# Patient Record
Sex: Female | Born: 1955 | Race: Black or African American | Hispanic: No | Smoking: Never smoker
Health system: Southern US, Community
[De-identification: ages and names within clinical notes are randomized; demographics above are authoritative.]

## PROBLEM LIST (undated history)

## (undated) DIAGNOSIS — E559 Vitamin D deficiency, unspecified: Secondary | ICD-10-CM

## (undated) DIAGNOSIS — R7303 Prediabetes: Secondary | ICD-10-CM

## (undated) DIAGNOSIS — F329 Major depressive disorder, single episode, unspecified: Secondary | ICD-10-CM

## (undated) DIAGNOSIS — F419 Anxiety disorder, unspecified: Secondary | ICD-10-CM

## (undated) DIAGNOSIS — R011 Cardiac murmur, unspecified: Secondary | ICD-10-CM

## (undated) DIAGNOSIS — E538 Deficiency of other specified B group vitamins: Secondary | ICD-10-CM

## (undated) DIAGNOSIS — D509 Iron deficiency anemia, unspecified: Secondary | ICD-10-CM

## (undated) DIAGNOSIS — K219 Gastro-esophageal reflux disease without esophagitis: Secondary | ICD-10-CM

## (undated) DIAGNOSIS — M1990 Unspecified osteoarthritis, unspecified site: Secondary | ICD-10-CM

## (undated) DIAGNOSIS — IMO0001 Reserved for inherently not codable concepts without codable children: Secondary | ICD-10-CM

## (undated) DIAGNOSIS — F988 Other specified behavioral and emotional disorders with onset usually occurring in childhood and adolescence: Secondary | ICD-10-CM

## (undated) DIAGNOSIS — E119 Type 2 diabetes mellitus without complications: Secondary | ICD-10-CM

## (undated) DIAGNOSIS — E7800 Pure hypercholesterolemia, unspecified: Secondary | ICD-10-CM

## (undated) DIAGNOSIS — I10 Essential (primary) hypertension: Secondary | ICD-10-CM

## (undated) DIAGNOSIS — R600 Localized edema: Secondary | ICD-10-CM

## (undated) DIAGNOSIS — R739 Hyperglycemia, unspecified: Secondary | ICD-10-CM

## (undated) DIAGNOSIS — J189 Pneumonia, unspecified organism: Secondary | ICD-10-CM

## (undated) DIAGNOSIS — F32A Depression, unspecified: Secondary | ICD-10-CM

## (undated) HISTORY — DX: Pure hypercholesterolemia, unspecified: E78.00

## (undated) HISTORY — DX: Iron deficiency anemia, unspecified: D50.9

## (undated) HISTORY — DX: Vitamin D deficiency, unspecified: E55.9

## (undated) HISTORY — DX: Type 2 diabetes mellitus without complications: E11.9

## (undated) HISTORY — DX: Unspecified osteoarthritis, unspecified site: M19.90

## (undated) HISTORY — DX: Hyperglycemia, unspecified: R73.9

## (undated) HISTORY — PX: NO PAST SURGERIES: SHX2092

## (undated) HISTORY — DX: Gastro-esophageal reflux disease without esophagitis: K21.9

## (undated) HISTORY — DX: Deficiency of other specified B group vitamins: E53.8

## (undated) HISTORY — DX: Major depressive disorder, single episode, unspecified: F32.9

## (undated) HISTORY — DX: Reserved for inherently not codable concepts without codable children: IMO0001

## (undated) HISTORY — DX: Anxiety disorder, unspecified: F41.9

## (undated) HISTORY — DX: Localized edema: R60.0

## (undated) HISTORY — DX: Other specified behavioral and emotional disorders with onset usually occurring in childhood and adolescence: F98.8

## (undated) HISTORY — DX: Cardiac murmur, unspecified: R01.1

## (undated) HISTORY — DX: Depression, unspecified: F32.A

---

## 1999-12-22 ENCOUNTER — Emergency Department (HOSPITAL_COMMUNITY): Admission: EM | Admit: 1999-12-22 | Discharge: 1999-12-22 | Payer: Self-pay | Admitting: Emergency Medicine

## 2000-01-22 ENCOUNTER — Encounter: Payer: Self-pay | Admitting: Emergency Medicine

## 2000-01-22 ENCOUNTER — Emergency Department (HOSPITAL_COMMUNITY): Admission: EM | Admit: 2000-01-22 | Discharge: 2000-01-22 | Payer: Self-pay | Admitting: Emergency Medicine

## 2001-06-29 ENCOUNTER — Emergency Department (HOSPITAL_COMMUNITY): Admission: EM | Admit: 2001-06-29 | Discharge: 2001-06-29 | Payer: Self-pay | Admitting: *Deleted

## 2002-06-07 ENCOUNTER — Encounter: Payer: Self-pay | Admitting: Emergency Medicine

## 2002-06-07 ENCOUNTER — Emergency Department (HOSPITAL_COMMUNITY): Admission: EM | Admit: 2002-06-07 | Discharge: 2002-06-07 | Payer: Self-pay | Admitting: Emergency Medicine

## 2002-06-09 ENCOUNTER — Encounter: Admission: RE | Admit: 2002-06-09 | Discharge: 2002-06-09 | Payer: Self-pay | Admitting: Family Medicine

## 2002-06-09 ENCOUNTER — Encounter: Payer: Self-pay | Admitting: Family Medicine

## 2002-11-16 ENCOUNTER — Encounter: Admission: RE | Admit: 2002-11-16 | Discharge: 2002-11-16 | Payer: Self-pay | Admitting: Family Medicine

## 2002-11-16 ENCOUNTER — Encounter: Payer: Self-pay | Admitting: Family Medicine

## 2003-01-03 ENCOUNTER — Encounter: Payer: Self-pay | Admitting: Obstetrics

## 2003-01-03 ENCOUNTER — Encounter: Admission: RE | Admit: 2003-01-03 | Discharge: 2003-01-03 | Payer: Self-pay | Admitting: Obstetrics

## 2003-07-04 ENCOUNTER — Encounter: Payer: Self-pay | Admitting: Family Medicine

## 2003-07-04 ENCOUNTER — Encounter: Admission: RE | Admit: 2003-07-04 | Discharge: 2003-07-04 | Payer: Self-pay | Admitting: Family Medicine

## 2003-11-24 ENCOUNTER — Encounter: Admission: RE | Admit: 2003-11-24 | Discharge: 2003-11-24 | Payer: Self-pay | Admitting: Family Medicine

## 2004-08-16 ENCOUNTER — Ambulatory Visit: Payer: Self-pay | Admitting: Family Medicine

## 2004-08-30 ENCOUNTER — Ambulatory Visit: Payer: Self-pay | Admitting: Sports Medicine

## 2004-10-17 ENCOUNTER — Ambulatory Visit: Payer: Self-pay | Admitting: Family Medicine

## 2004-12-10 ENCOUNTER — Ambulatory Visit: Payer: Self-pay | Admitting: Family Medicine

## 2005-01-08 ENCOUNTER — Ambulatory Visit: Payer: Self-pay | Admitting: Family Medicine

## 2005-01-16 ENCOUNTER — Ambulatory Visit (HOSPITAL_COMMUNITY): Admission: RE | Admit: 2005-01-16 | Discharge: 2005-01-16 | Payer: Self-pay | Admitting: Family Medicine

## 2006-01-02 ENCOUNTER — Ambulatory Visit: Payer: Self-pay | Admitting: Internal Medicine

## 2006-04-22 ENCOUNTER — Ambulatory Visit (HOSPITAL_COMMUNITY): Admission: RE | Admit: 2006-04-22 | Discharge: 2006-04-22 | Payer: Self-pay | Admitting: Unknown Physician Specialty

## 2006-08-07 DIAGNOSIS — E1159 Type 2 diabetes mellitus with other circulatory complications: Secondary | ICD-10-CM | POA: Insufficient documentation

## 2006-08-07 DIAGNOSIS — I10 Essential (primary) hypertension: Secondary | ICD-10-CM

## 2006-08-07 DIAGNOSIS — D509 Iron deficiency anemia, unspecified: Secondary | ICD-10-CM | POA: Insufficient documentation

## 2007-06-17 ENCOUNTER — Encounter (INDEPENDENT_AMBULATORY_CARE_PROVIDER_SITE_OTHER): Payer: Self-pay | Admitting: *Deleted

## 2007-06-17 ENCOUNTER — Ambulatory Visit: Payer: Self-pay | Admitting: Family Medicine

## 2007-06-17 LAB — CONVERTED CEMR LAB
Basophils Absolute: 0 10*3/uL (ref 0.0–0.1)
CO2: 23 meq/L (ref 19–32)
Chlamydia, DNA Probe: NEGATIVE
Chloride: 105 meq/L (ref 96–112)
Creatinine, Ser: 0.88 mg/dL (ref 0.40–1.20)
Eosinophils Absolute: 0.1 10*3/uL (ref 0.0–0.7)
GC Probe Amp, Genital: NEGATIVE
HCT: 34.6 % — ABNORMAL LOW (ref 36.0–46.0)
Hemoglobin: 11.4 g/dL — ABNORMAL LOW (ref 12.0–15.0)
Lymphocytes Relative: 39 % (ref 12–46)
MCV: 84.4 fL (ref 78.0–100.0)
Neutro Abs: 2.7 10*3/uL (ref 1.7–7.7)
Platelets: 310 10*3/uL (ref 150–400)
Potassium: 4 meq/L (ref 3.5–5.3)
RBC: 4.1 M/uL (ref 3.87–5.11)
RDW: 16.3 % — ABNORMAL HIGH (ref 11.5–15.5)
Total Bilirubin: 0.7 mg/dL (ref 0.3–1.2)
WBC: 5.2 10*3/uL (ref 4.0–10.5)

## 2007-06-18 ENCOUNTER — Encounter (INDEPENDENT_AMBULATORY_CARE_PROVIDER_SITE_OTHER): Payer: Self-pay | Admitting: *Deleted

## 2007-06-22 ENCOUNTER — Encounter (INDEPENDENT_AMBULATORY_CARE_PROVIDER_SITE_OTHER): Payer: Self-pay | Admitting: *Deleted

## 2007-06-22 LAB — CONVERTED CEMR LAB: Pap Smear: NORMAL

## 2007-07-15 ENCOUNTER — Emergency Department (HOSPITAL_COMMUNITY): Admission: EM | Admit: 2007-07-15 | Discharge: 2007-07-16 | Payer: Self-pay | Admitting: Emergency Medicine

## 2007-07-20 ENCOUNTER — Ambulatory Visit: Payer: Self-pay | Admitting: Sports Medicine

## 2007-07-20 ENCOUNTER — Encounter (INDEPENDENT_AMBULATORY_CARE_PROVIDER_SITE_OTHER): Payer: Self-pay | Admitting: *Deleted

## 2007-07-20 DIAGNOSIS — K219 Gastro-esophageal reflux disease without esophagitis: Secondary | ICD-10-CM

## 2007-07-20 LAB — CONVERTED CEMR LAB
Basophils Absolute: 0 10*3/uL (ref 0.0–0.1)
Basophils Relative: 0 % (ref 0–1)
Eosinophils Absolute: 0.1 10*3/uL (ref 0.0–0.7)
Eosinophils Relative: 2 % (ref 0–5)
Hemoglobin: 12.3 g/dL (ref 12.0–15.0)
Lymphs Abs: 2.5 10*3/uL (ref 0.7–4.0)
MCHC: 34 g/dL (ref 30.0–36.0)
Monocytes Absolute: 0.3 10*3/uL (ref 0.1–1.0)
RDW: 14.9 % (ref 11.5–15.5)
Retic Ct Pct: 1.4 % (ref 0.4–3.1)
TIBC: 286 ug/dL (ref 250–470)
UIBC: 231 ug/dL

## 2007-07-21 ENCOUNTER — Encounter (INDEPENDENT_AMBULATORY_CARE_PROVIDER_SITE_OTHER): Payer: Self-pay | Admitting: *Deleted

## 2007-07-21 ENCOUNTER — Ambulatory Visit: Payer: Self-pay | Admitting: Family Medicine

## 2007-07-24 ENCOUNTER — Encounter (INDEPENDENT_AMBULATORY_CARE_PROVIDER_SITE_OTHER): Payer: Self-pay | Admitting: *Deleted

## 2007-07-28 ENCOUNTER — Ambulatory Visit (HOSPITAL_COMMUNITY): Admission: RE | Admit: 2007-07-28 | Discharge: 2007-07-28 | Payer: Self-pay | Admitting: *Deleted

## 2007-08-18 ENCOUNTER — Ambulatory Visit: Payer: Self-pay | Admitting: Family Medicine

## 2007-08-18 ENCOUNTER — Encounter (INDEPENDENT_AMBULATORY_CARE_PROVIDER_SITE_OTHER): Payer: Self-pay | Admitting: *Deleted

## 2007-08-18 DIAGNOSIS — R011 Cardiac murmur, unspecified: Secondary | ICD-10-CM

## 2007-08-18 LAB — CONVERTED CEMR LAB
BUN: 16 mg/dL (ref 6–23)
Calcium: 9.6 mg/dL (ref 8.4–10.5)
Chloride: 104 meq/L (ref 96–112)
Creatinine, Ser: 0.92 mg/dL (ref 0.40–1.20)
Glucose, Bld: 88 mg/dL (ref 70–99)
Potassium: 3.4 meq/L — ABNORMAL LOW (ref 3.5–5.3)

## 2007-08-26 ENCOUNTER — Ambulatory Visit: Payer: Self-pay | Admitting: Cardiology

## 2007-08-26 ENCOUNTER — Ambulatory Visit (HOSPITAL_COMMUNITY): Admission: RE | Admit: 2007-08-26 | Discharge: 2007-08-26 | Payer: Self-pay | Admitting: Family Medicine

## 2007-08-26 ENCOUNTER — Encounter: Payer: Self-pay | Admitting: Family Medicine

## 2007-09-09 ENCOUNTER — Telehealth (INDEPENDENT_AMBULATORY_CARE_PROVIDER_SITE_OTHER): Payer: Self-pay | Admitting: *Deleted

## 2007-09-30 ENCOUNTER — Ambulatory Visit: Payer: Self-pay | Admitting: Family Medicine

## 2007-09-30 ENCOUNTER — Encounter (INDEPENDENT_AMBULATORY_CARE_PROVIDER_SITE_OTHER): Payer: Self-pay | Admitting: *Deleted

## 2007-09-30 LAB — CONVERTED CEMR LAB: Potassium: 3.9 meq/L (ref 3.5–5.3)

## 2007-10-01 ENCOUNTER — Encounter (INDEPENDENT_AMBULATORY_CARE_PROVIDER_SITE_OTHER): Payer: Self-pay | Admitting: *Deleted

## 2007-10-28 ENCOUNTER — Telehealth: Payer: Self-pay | Admitting: *Deleted

## 2007-12-14 ENCOUNTER — Emergency Department (HOSPITAL_COMMUNITY): Admission: EM | Admit: 2007-12-14 | Discharge: 2007-12-14 | Payer: Self-pay | Admitting: Emergency Medicine

## 2007-12-15 ENCOUNTER — Telehealth: Payer: Self-pay | Admitting: *Deleted

## 2007-12-16 ENCOUNTER — Ambulatory Visit: Payer: Self-pay | Admitting: Family Medicine

## 2007-12-27 ENCOUNTER — Emergency Department (HOSPITAL_COMMUNITY): Admission: EM | Admit: 2007-12-27 | Discharge: 2007-12-27 | Payer: Self-pay | Admitting: Emergency Medicine

## 2008-01-13 ENCOUNTER — Emergency Department (HOSPITAL_COMMUNITY): Admission: EM | Admit: 2008-01-13 | Discharge: 2008-01-13 | Payer: Self-pay | Admitting: Emergency Medicine

## 2008-04-14 ENCOUNTER — Encounter (INDEPENDENT_AMBULATORY_CARE_PROVIDER_SITE_OTHER): Payer: Self-pay | Admitting: *Deleted

## 2008-04-18 ENCOUNTER — Encounter: Payer: Self-pay | Admitting: Family Medicine

## 2008-04-18 ENCOUNTER — Ambulatory Visit: Payer: Self-pay | Admitting: Family Medicine

## 2008-04-19 LAB — CONVERTED CEMR LAB: TSH: 1.435 microintl units/mL (ref 0.350–4.50)

## 2008-05-30 ENCOUNTER — Telehealth: Payer: Self-pay | Admitting: *Deleted

## 2008-05-31 ENCOUNTER — Encounter (INDEPENDENT_AMBULATORY_CARE_PROVIDER_SITE_OTHER): Payer: Self-pay | Admitting: Family Medicine

## 2008-05-31 ENCOUNTER — Ambulatory Visit: Payer: Self-pay | Admitting: Family Medicine

## 2008-05-31 LAB — CONVERTED CEMR LAB

## 2008-06-05 ENCOUNTER — Encounter (INDEPENDENT_AMBULATORY_CARE_PROVIDER_SITE_OTHER): Payer: Self-pay | Admitting: Family Medicine

## 2008-06-23 ENCOUNTER — Telehealth: Payer: Self-pay | Admitting: Family Medicine

## 2008-06-28 ENCOUNTER — Telehealth: Payer: Self-pay | Admitting: Family Medicine

## 2008-07-25 ENCOUNTER — Encounter: Payer: Self-pay | Admitting: Family Medicine

## 2008-07-25 ENCOUNTER — Ambulatory Visit: Payer: Self-pay | Admitting: Family Medicine

## 2008-07-25 DIAGNOSIS — E663 Overweight: Secondary | ICD-10-CM | POA: Insufficient documentation

## 2008-07-25 DIAGNOSIS — F988 Other specified behavioral and emotional disorders with onset usually occurring in childhood and adolescence: Secondary | ICD-10-CM | POA: Insufficient documentation

## 2008-07-25 LAB — CONVERTED CEMR LAB
BUN: 12 mg/dL (ref 6–23)
Chlamydia, DNA Probe: NEGATIVE
Hemoglobin: 11.7 g/dL — ABNORMAL LOW (ref 12.0–15.0)
LDL Cholesterol: 63 mg/dL (ref 0–99)
MCHC: 33.9 g/dL (ref 30.0–36.0)
MCV: 83.9 fL (ref 78.0–100.0)
Pap Smear: NORMAL
Potassium: 3.7 meq/L (ref 3.5–5.3)
RDW: 16 % — ABNORMAL HIGH (ref 11.5–15.5)
Triglycerides: 44 mg/dL (ref <150)
VLDL: 9 mg/dL (ref 0–40)
WBC: 5.5 10*3/uL (ref 4.0–10.5)
Whiff Test: NEGATIVE

## 2008-07-26 ENCOUNTER — Encounter: Payer: Self-pay | Admitting: Family Medicine

## 2008-08-24 ENCOUNTER — Ambulatory Visit: Payer: Self-pay | Admitting: Family Medicine

## 2008-08-24 DIAGNOSIS — F329 Major depressive disorder, single episode, unspecified: Secondary | ICD-10-CM

## 2008-08-24 DIAGNOSIS — F3289 Other specified depressive episodes: Secondary | ICD-10-CM | POA: Insufficient documentation

## 2008-08-26 ENCOUNTER — Ambulatory Visit (HOSPITAL_COMMUNITY): Admission: RE | Admit: 2008-08-26 | Discharge: 2008-08-26 | Payer: Self-pay | Admitting: Family Medicine

## 2008-09-12 ENCOUNTER — Ambulatory Visit: Payer: Self-pay | Admitting: Family Medicine

## 2009-02-19 IMAGING — CR DG CHEST 2V
2 series · 2 of 2 positions shown · non-contrast
Comparison: None available

CLINICAL DATA: Chest pain, shortness of breath, cough

CHEST - 2 VIEW

[w chest pa]
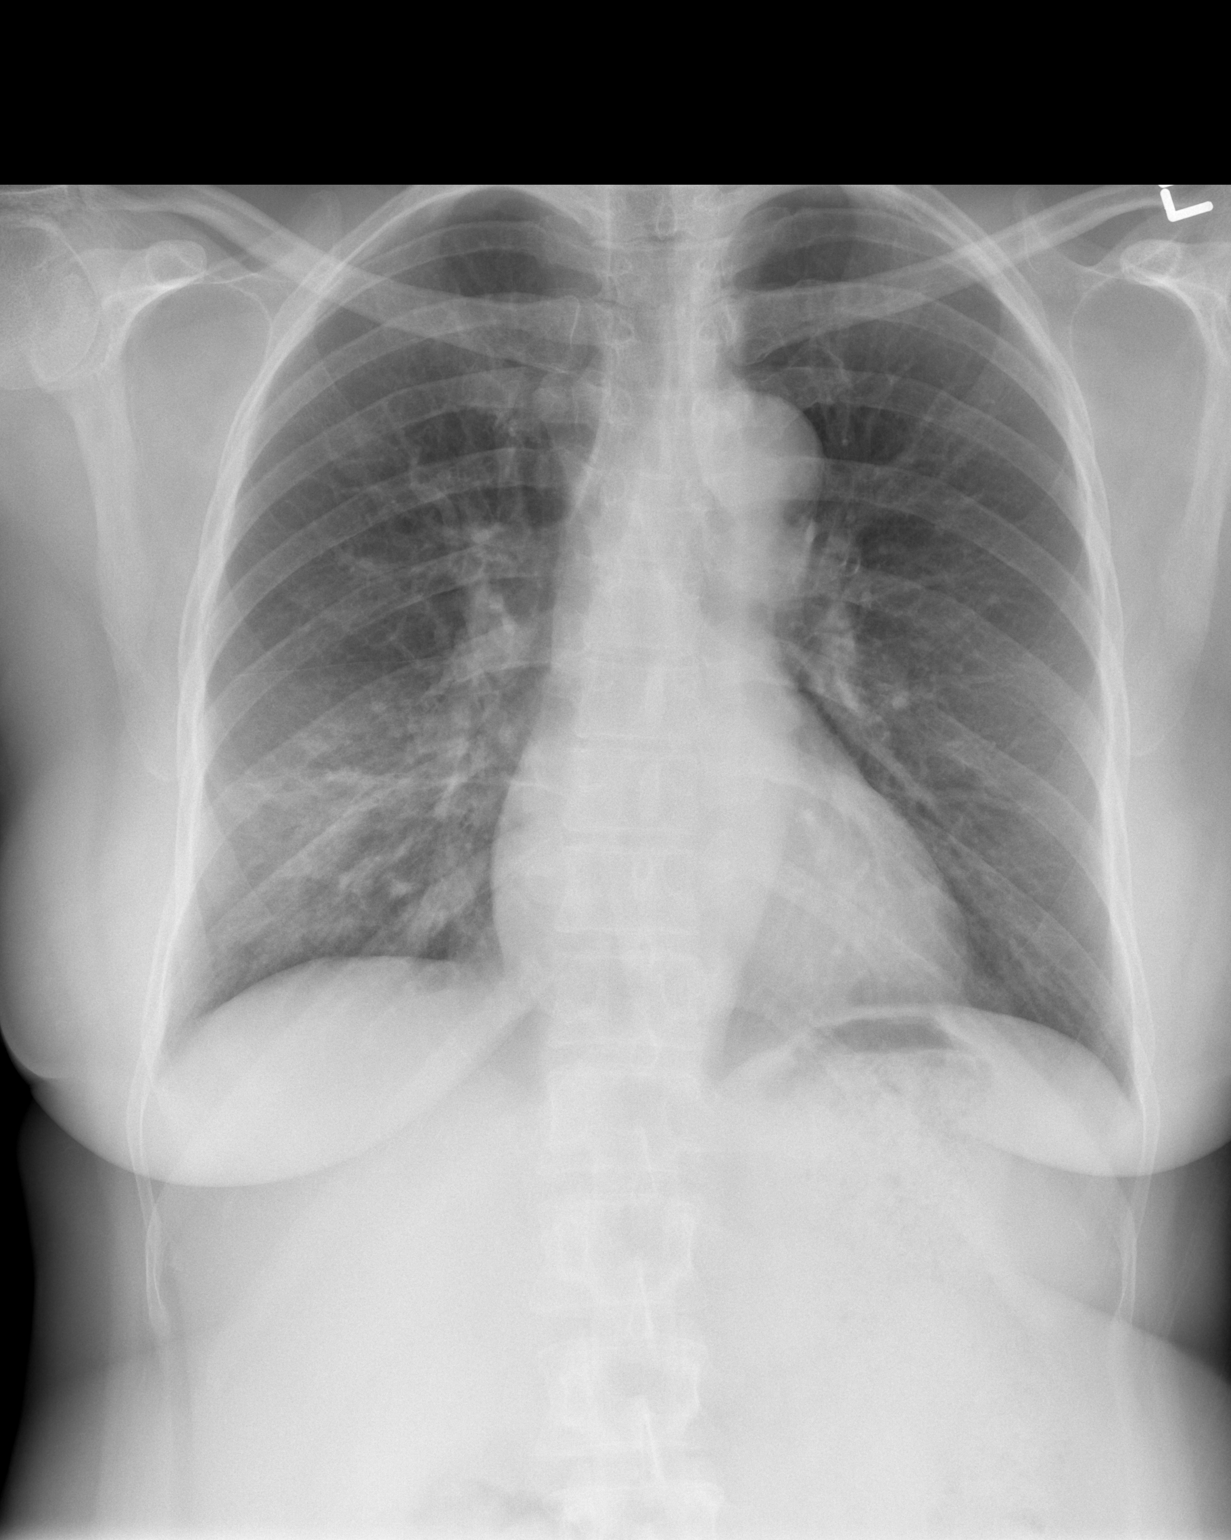

[w chest lat]
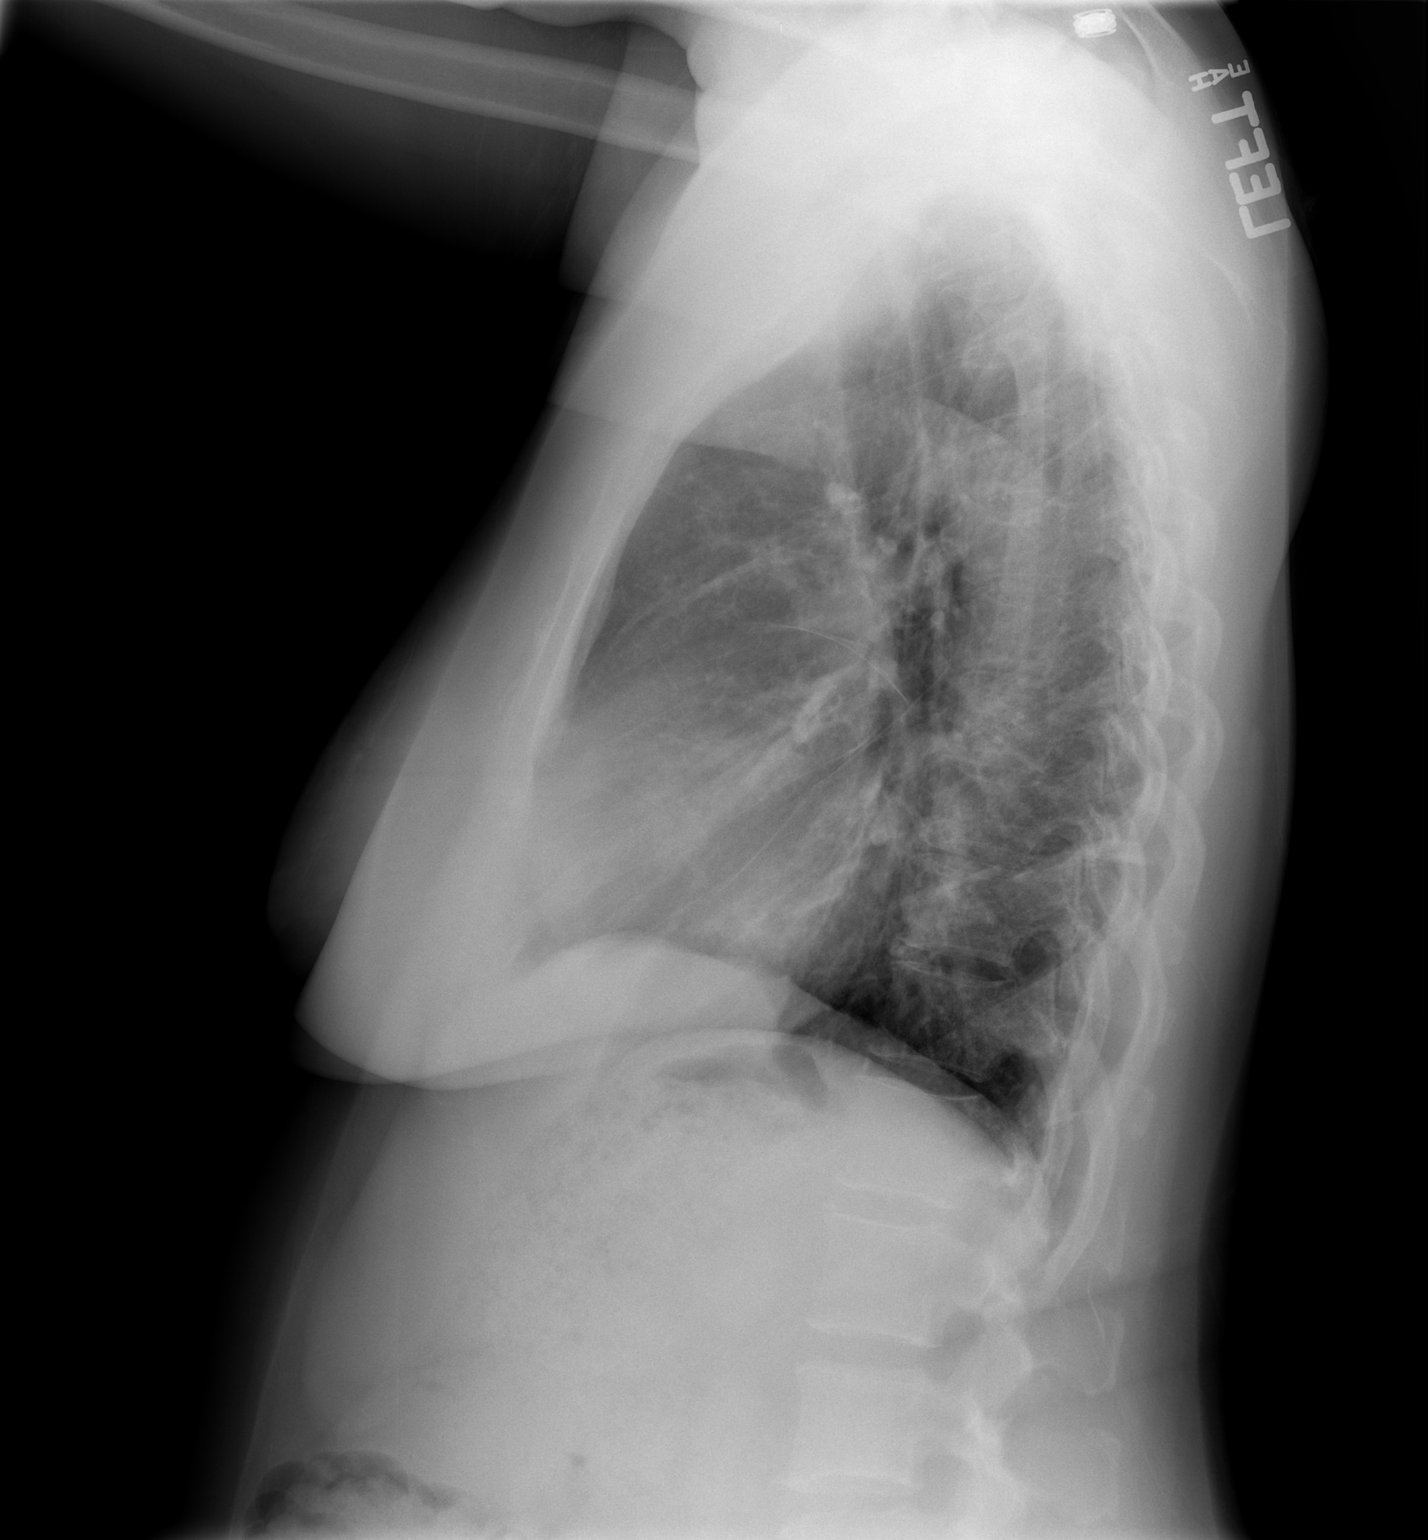

[2 of 2 positions shown; findings below may reference images not displayed]

FINDINGS: There are  patchy airspace opacities in the right lower
lobe.  Left lung clear.  Heart size normal.  No effusion.
Visualized bones unremarkable.
IMPRESSION: 1.  Patchy right lower lobe airspace opacities suggesting pneumonia

## 2009-03-13 ENCOUNTER — Encounter: Payer: Self-pay | Admitting: Family Medicine

## 2009-03-13 ENCOUNTER — Ambulatory Visit: Payer: Self-pay | Admitting: Family Medicine

## 2009-03-13 DIAGNOSIS — E559 Vitamin D deficiency, unspecified: Secondary | ICD-10-CM | POA: Insufficient documentation

## 2009-03-13 LAB — CONVERTED CEMR LAB: Vit D, 25-Hydroxy: 19 ng/mL — ABNORMAL LOW (ref 30–89)

## 2009-03-14 ENCOUNTER — Telehealth (INDEPENDENT_AMBULATORY_CARE_PROVIDER_SITE_OTHER): Payer: Self-pay | Admitting: *Deleted

## 2009-07-19 ENCOUNTER — Encounter: Payer: Self-pay | Admitting: Family Medicine

## 2009-08-31 ENCOUNTER — Encounter: Payer: Self-pay | Admitting: Family Medicine

## 2009-12-25 ENCOUNTER — Ambulatory Visit: Payer: Self-pay | Admitting: Family Medicine

## 2009-12-25 DIAGNOSIS — IMO0001 Reserved for inherently not codable concepts without codable children: Secondary | ICD-10-CM

## 2009-12-30 ENCOUNTER — Emergency Department (HOSPITAL_COMMUNITY): Admission: EM | Admit: 2009-12-30 | Discharge: 2009-12-30 | Payer: Self-pay | Admitting: Family Medicine

## 2010-01-18 ENCOUNTER — Telehealth: Payer: Self-pay | Admitting: Family Medicine

## 2010-01-19 ENCOUNTER — Ambulatory Visit: Payer: Self-pay | Admitting: Family Medicine

## 2010-01-19 ENCOUNTER — Ambulatory Visit (HOSPITAL_COMMUNITY): Admission: RE | Admit: 2010-01-19 | Discharge: 2010-01-19 | Payer: Self-pay | Admitting: Family Medicine

## 2010-01-19 DIAGNOSIS — M79609 Pain in unspecified limb: Secondary | ICD-10-CM

## 2010-01-19 DIAGNOSIS — M25539 Pain in unspecified wrist: Secondary | ICD-10-CM

## 2010-01-30 ENCOUNTER — Telehealth: Payer: Self-pay | Admitting: *Deleted

## 2010-02-01 ENCOUNTER — Encounter: Admission: RE | Admit: 2010-02-01 | Discharge: 2010-03-22 | Payer: Self-pay | Admitting: Family Medicine

## 2010-04-20 ENCOUNTER — Encounter: Payer: Self-pay | Admitting: Family Medicine

## 2010-05-08 ENCOUNTER — Ambulatory Visit: Payer: Self-pay | Admitting: Family Medicine

## 2010-05-29 ENCOUNTER — Ambulatory Visit: Payer: Self-pay

## 2010-07-10 NOTE — Miscellaneous (Signed)
  Clinical Lists Changes  Medications: Removed medication of TRAMADOL HCL 50 MG  TABS (TRAMADOL HCL) 1 by mouth every 6 hrs as needed
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
## 2010-07-10 NOTE — Progress Notes (Signed)
Summary: phnmsg  Phone Note Call from Patient Call back at Home Phone [phone]   Caller: Patient Summary of Call: legs are still hurting and wants to go to PT- needs referral Initial call taken by: De Nurse,  January 18, 2010 4:21 PM  Follow-up for Phone Call        LM. when she calls back, she will need to have an appt before md will do a referral Follow-up by: Golden Circle RN,  January 18, 2010 4:23 PM  Additional Follow-up for Phone Call Additional follow up Details #1::        went to UC 2 wks ago & was told she needs a PT referral. she will see work ini md at 8:30 tomorrow Additional Follow-up by: Golden Circle RN,  January 18, 2010 4:36 PM
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
## 2010-07-10 NOTE — Progress Notes (Signed)
Summary: Winfred Burn  Phone Note Call from Patient Call back at [facility] Phone [phone]   Caller: Patient Summary of Call: Pt has appt with PT tomorrow and needs to know where and when. Initial call taken by: Clydell Hakim,  January 30, 2010 2:11 PM  Follow-up for Phone Call        spoke with pt and told her location Follow-up by: Loralee Pacas CMA,  January 31, 2010 12:26 PM
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
## 2010-07-10 NOTE — Miscellaneous (Signed)
  Clinical Lists Changes  Medications: Removed medication of RITALIN LA 20 MG XR24H-CAP (METHYLPHENIDATE HCL) one tab by mouth q.a.m.  fill after 04/13/09

## 2010-07-10 NOTE — Miscellaneous (Signed)
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

## 2010-10-26 NOTE — Assessment & Plan Note (Signed)
[facility]                          [place] OFFICE NOTE   NAME:Emily Lopez, Emily Lopez                      MRN:          [id number]  DATE:01/02/2006                            DOB:          September 08, 1955    CHIEF COMPLAINT:  Here to establish.   HISTORY OF PRESENT ILLNESS:  Emily Lopez is a 55 year old female who came to  the office to get established.  She needs a refill on her medications.  She  has also noticed shoulder pain on the right side for one week.  She does not  think there was any injury and she has not been doing anything out of the  ordinary.  The pain is worse when she tries to raise the arm or lay down and  is worse at night.  She has not tried any medication.  Also, about a year  ago, staples were placed on her ears by a chiropractor with intention to  help her lose weight.  It did not work and she likes them out.   PAST MEDICAL HISTORY:  1.  Hypertension.  2.  History of anemia all her life.  Apparently on etiology was every      found for this anemia.   FAMILY HISTORY:  1.  Father is deceased, he had lung cancer.  2.  Mother has hypertension, diabetes and arthritis.  3.  No history of colon or breast cancer.  No history of heart attack in the      family.   SOCIAL HISTORY:  Does not smoke.  Drinks socially.  She is married and has  three children.   REVIEW OF SYSTEMS:  Again, in general, she is doing well.  No chest pain,  shortness of breath.  There is no blood in the stools that she can tell.  Her periods are basically normal, monthly.  They last 2 days and they are  not heavy.  She does not take any birth control but she is not sexually  active at this point.  Her normal ambulatory blood pressure is around  120/80.   MEDICATIONS:  1.  HCTZ 25 one p.o. daily.  2.  Enalapril 5 one p.o. daily.  3.  Iron pills.   ALLERGIES:  NO KNOWN DRUG ALLERGIES.   PHYSICAL EXAM:  The patient is alert and oriented in no  apparent distress.  She weighs 176.4 pounds, blood pressure 142/100, pulse 60, respirations 14.  LUNGS:  Clear to auscultation bilaterally.  CARDIOVASCULAR:  Regular rate and rhythm without any murmur.  SHOULDER EXAM:  Left is normal.  Right - elevation of the right arm is  limited.  Rotation of the shoulder is somehow painful.  There is no  deformity and the strength of her upper extremity is normal.   ASSESSMENT AND PLAN:  1.  Hypertension.  Will check a comprehensive panel and refill her medicine.  2.  She reports a history of anemia.  Iron pills were refilled.  I will      check a CBC, iron and ferritin levels.  Will stop the iron pills and  recheck in a few months if the iron levels are normal.  3.  Surgical staple was removed from the ear without any problems.  There      was no sign of discharge or infection.  She is instructed to put      antibiotic ointment and let me know if there is evidence of infection in      that area.  4.  The patient is planning to try Alli to lose weight and I agree with      that.  5.  Shoulder pain.  Will give the patient information about home shoulder      physical therapy and ask her to take Motrin and if the pain is no      better, will do a formal referral to physical therapy.  6.  Office visit in six months.                                   Willow Ora, MD   JP/MedQ  DD:  01/02/2006  DT:  01/02/2006  Job #:  [id number]

## 2011-08-09 ENCOUNTER — Encounter (HOSPITAL_COMMUNITY): Payer: Self-pay | Admitting: *Deleted

## 2011-08-09 ENCOUNTER — Emergency Department (HOSPITAL_COMMUNITY)
Admission: EM | Admit: 2011-08-09 | Discharge: 2011-08-10 | Disposition: A | Discharge status: Home / Self Care | Attending: Emergency Medicine | Admitting: Emergency Medicine

## 2011-08-09 ENCOUNTER — Emergency Department (HOSPITAL_COMMUNITY)

## 2011-08-09 DIAGNOSIS — Z79899 Other long term (current) drug therapy: Secondary | ICD-10-CM | POA: Insufficient documentation

## 2011-08-09 DIAGNOSIS — R0789 Other chest pain: Secondary | ICD-10-CM

## 2011-08-09 DIAGNOSIS — I10 Essential (primary) hypertension: Secondary | ICD-10-CM | POA: Insufficient documentation

## 2011-08-09 HISTORY — DX: Essential (primary) hypertension: I10

## 2011-08-09 HISTORY — DX: Pneumonia, unspecified organism: J18.9

## 2011-08-09 LAB — POCT I-STAT, CHEM 8
HCT: 39 % (ref 36.0–46.0)
Hemoglobin: 13.3 g/dL (ref 12.0–15.0)
Sodium: 143 mEq/L (ref 135–145)
TCO2: 31 mmol/L (ref 0–100)

## 2011-08-09 NOTE — ED Notes (Signed)
C/o intermittant stabbing mid CP, radiates to L shoulder, comes ~ every 5 minutes, lasts a couple seconds, notices it more if moving, not effected by deep inspiration, "tries to lay still". Denies other sx. Alert, NAD, calm, interactive, speaking in clear complete sentences,  resps e/u.

## 2011-08-10 LAB — CBC
HCT: 35.3 % — ABNORMAL LOW (ref 36.0–46.0)
MCH: 27.6 pg (ref 26.0–34.0)
MCHC: 34.3 g/dL (ref 30.0–36.0)
RDW: 15.4 % (ref 11.5–15.5)

## 2011-08-10 LAB — DIFFERENTIAL
Basophils Absolute: 0 10*3/uL (ref 0.0–0.1)
Basophils Relative: 0 % (ref 0–1)
Eosinophils Relative: 1 % (ref 0–5)
Monocytes Absolute: 0.4 10*3/uL (ref 0.1–1.0)
Neutro Abs: 3.5 10*3/uL (ref 1.7–7.7)

## 2011-08-10 LAB — POCT I-STAT TROPONIN I: Troponin i, poc: 0 ng/mL (ref 0.00–0.08)

## 2011-08-10 NOTE — ED Notes (Signed)
The pt  Is here with her female friend who has also checked in as a pt.  She says she has had some chest pain lt sided for several days.  Lying flat with no distress. No cardiac  History.  Alert oriiented skin warm and sry.  The pt is not sure if she still wants to be seen

## 2011-08-10 NOTE — Discharge Instructions (Signed)
Ibuprofen 600 mg three times daily for the next 5 days.        Chest Pain (Nonspecific) It is often hard to give a specific diagnosis for the cause of chest pain. There is always a chance that your pain could be related to something serious, such as a heart attack or a blood clot in the lungs. You need to follow up with your caregiver for further evaluation. CAUSES   Heartburn.   Pneumonia or bronchitis.   Anxiety and stress.   Inflammation around your heart (pericarditis) or lung (pleuritis or pleurisy).   A blood clot in the lung.   A collapsed lung (pneumothorax). It can develop suddenly on its own (spontaneous pneumothorax) or from injury (trauma) to the chest.  The chest wall is composed of bones, muscles, and cartilage. Any of these can be the source of the pain.  The bones can be bruised by injury.   The muscles or cartilage can be strained by coughing or overwork.   The cartilage can be affected by inflammation and become sore (costochondritis).  DIAGNOSIS  Lab tests or other studies, such as X-rays, an EKG, stress testing, or cardiac imaging, may be needed to find the cause of your pain.  TREATMENT   Treatment depends on what may be causing your chest pain. Treatment may include:   Acid blockers for heartburn.   Anti-inflammatory medicine.   Pain medicine for inflammatory conditions.   Antibiotics if an infection is present.   You may be advised to change lifestyle habits. This includes stopping smoking and avoiding caffeine and chocolate.   You may be advised to keep your head raised (elevated) when sleeping. This reduces the chance of acid going backward from your stomach into your esophagus.   Most of the time, nonspecific chest pain will improve within 2 to 3 days with rest and mild pain medicine.  HOME CARE INSTRUCTIONS   If antibiotics were prescribed, take the full amount even if you start to feel better.   For the next few days, avoid physical  activities that bring on chest pain. Continue physical activities as directed.   Do not smoke cigarettes or drink alcohol until your symptoms are gone.   Only take over-the-counter or prescription medicine for pain, discomfort, or fever as directed by your caregiver.   Follow your caregiver's suggestions for further testing if your chest pain does not go away.   Keep any follow-up appointments you made. If you do not go to an appointment, you could develop lasting (chronic) problems with pain. If there is any problem keeping an appointment, you must call to reschedule.  SEEK MEDICAL CARE IF:   You think you are having problems from the medicine you are taking. Read your medicine instructions carefully.   Your chest pain does not go away, even after treatment.   You develop a rash with blisters on your chest.  SEEK IMMEDIATE MEDICAL CARE IF:   You have increased chest pain or pain that spreads to your arm, neck, jaw, back, or belly (abdomen).   You develop shortness of breath, an increasing cough, or you are coughing up blood.   You have severe back or abdominal pain, feel sick to your stomach (nauseous) or throw up (vomit).   You develop severe weakness, fainting, or chills.   You have an oral temperature above 102 F (38.9 C), not controlled by medicine.  THIS IS AN EMERGENCY. Do not wait to see if the pain will go away.  Get medical help at once. Call your local emergency services (911 in [place] Do not drive yourself to the hospital. MAKE SURE YOU:   Understand these instructions.   Will watch your condition.   Will get help right away if you are not doing well or get worse.  Document Released: 03/06/2005 Document Revised: 02/06/2011 Document Reviewed: 12/31/2007 [facility] Patient Information 2012 [place], [place].

## 2011-08-10 NOTE — ED Provider Notes (Signed)
History     CSN: [id number]  Arrival date & time 08/09/11  2313   First MD Initiated Contact with Patient 08/10/11 0106      Chief Complaint  Patient presents with  . Chest Pain    (Consider location/radiation/quality/duration/timing/severity/associated sxs/prior treatment) Patient is a 56 y.o. female presenting with chest pain. The history is provided by the patient.  Chest Pain Episode onset: 3 days ago. Chest pain occurs constantly. The chest pain is unchanged. Associated with: nothing. The severity of the pain is mild. The quality of the pain is described as aching. The pain does not radiate. Exacerbated by: movement. Pertinent negatives for primary symptoms include no fever, no shortness of breath, no cough, no wheezing, no abdominal pain, no nausea and no vomiting. She tried nothing for the symptoms. Risk factors include no known risk factors.  Her past medical history is significant for hypertension.  Pertinent negatives for family medical history include: no CAD in family.     Past Medical History  Diagnosis Date  . Hypertension   . Pneumonia     2008 or 2009    History reviewed. No pertinent past surgical history.  Family History  Problem Relation Age of Onset  . Hypertension Mother   . Hypertension Sister     History  Substance Use Topics  . Smoking status: Never Smoker   . Smokeless tobacco: Not on file  . Alcohol Use: Yes     occaisionally    OB History    Grav Para Term Preterm Abortions TAB SAB Ect Mult Living                  Review of Systems  Constitutional: Negative for fever.  Respiratory: Negative for cough, shortness of breath and wheezing.   Cardiovascular: Positive for chest pain.  Gastrointestinal: Negative for nausea, vomiting and abdominal pain.  All other systems reviewed and are negative.    Allergies  Review of patient's allergies indicates no known allergies.  Home Medications   Current Outpatient Rx  Name Route Sig  Dispense Refill  . LISINOPRIL-HYDROCHLOROTHIAZIDE 20-25 MG PO TABS Oral Take 1 tablet by mouth at bedtime.        BP 178/98  Pulse 74  Temp(Src) 98.4 F (36.9 C) (Oral)  Resp 20  SpO2 100%  Physical Exam  Nursing note and vitals reviewed. Constitutional: She is oriented to person, place, and time. She appears well-developed and well-nourished. No distress.  HENT:  Head: Normocephalic and atraumatic.  Neck: Normal range of motion. Neck supple.  Cardiovascular: Normal rate and regular rhythm.  Exam reveals no gallop and no friction rub.   No murmur heard. Pulmonary/Chest: Effort normal and breath sounds normal. No respiratory distress. She has no wheezes.  Abdominal: Soft. Bowel sounds are normal. She exhibits no distension. There is no tenderness.  Musculoskeletal: Normal range of motion.  Neurological: She is alert and oriented to person, place, and time.  Skin: Skin is warm and dry. She is not diaphoretic.    ED Course  Procedures (including critical care time)  Labs Reviewed  CBC - Abnormal; Notable for the following:    HCT 35.3 (*)    All other components within normal limits  DIFFERENTIAL - Abnormal; Notable for the following:    Lymphocytes Relative 48 (*)    All other components within normal limits  POCT I-STAT, CHEM 8 - Abnormal; Notable for the following:    Potassium 3.1 (*)    BUN 24 (*)  Glucose, Bld 116 (*)    All other components within normal limits  POCT I-STAT TROPONIN I   Dg Chest 2 View  08/09/2011  *RADIOLOGY REPORT*  Clinical Data: Chest pain  CHEST - 2 VIEW  Comparison: 01/13/2008  Findings: The heart size and mediastinal contours are within normal limits.  Both lungs are clear.  The visualized skeletal structures are unremarkable.  IMPRESSION: Negative exam.  Original Report Authenticated By: Rosealee Albee, M.D.     No diagnosis found.   Date: 08/10/2011  Rate: 72  Rhythm: normal sinus rhythm  QRS Axis: normal  Intervals: normal  ST/T  Wave abnormalities: normal  Conduction Disutrbances:none  Narrative Interpretation:   Old EKG Reviewed: none available    MDM  The patient's symptoms are atypical for cardiac pain and ekg, troponin are okay.  Will discharge to home with nsaids, follow up with pcp.  Return prn if worsens.        Geoffery Lyons, MD 08/10/11 [phone]

## 2011-08-23 ENCOUNTER — Ambulatory Visit: Payer: Self-pay | Admitting: Family Medicine

## 2011-11-14 ENCOUNTER — Ambulatory Visit

## 2011-11-14 ENCOUNTER — Ambulatory Visit (INDEPENDENT_AMBULATORY_CARE_PROVIDER_SITE_OTHER): Admitting: Internal Medicine

## 2011-11-14 VITALS — BP 194/101 | HR 73 | Temp 98.0°F | Resp 18 | Ht 67.5 in | Wt 183.0 lb

## 2011-11-14 DIAGNOSIS — F419 Anxiety disorder, unspecified: Secondary | ICD-10-CM

## 2011-11-14 DIAGNOSIS — I10 Essential (primary) hypertension: Secondary | ICD-10-CM

## 2011-11-14 DIAGNOSIS — F411 Generalized anxiety disorder: Secondary | ICD-10-CM

## 2011-11-14 DIAGNOSIS — M25512 Pain in left shoulder: Secondary | ICD-10-CM

## 2011-11-14 DIAGNOSIS — M2550 Pain in unspecified joint: Secondary | ICD-10-CM

## 2011-11-14 DIAGNOSIS — M25519 Pain in unspecified shoulder: Secondary | ICD-10-CM

## 2011-11-14 DIAGNOSIS — R778 Other specified abnormalities of plasma proteins: Secondary | ICD-10-CM

## 2011-11-14 LAB — POCT CBC
HCT, POC: 39 % (ref 37.7–47.9)
Hemoglobin: 12.5 g/dL (ref 12.2–16.2)
Lymph, poc: 3.3 (ref 0.6–3.4)
MCHC: 32.1 g/dL (ref 31.8–35.4)
POC Granulocyte: 3 (ref 2–6.9)

## 2011-11-14 LAB — POCT URINALYSIS DIPSTICK
Blood, UA: NEGATIVE
Glucose, UA: NEGATIVE
Nitrite, UA: NEGATIVE
Urobilinogen, UA: 0.2

## 2011-11-14 LAB — LIPID PANEL
Cholesterol: 207 mg/dL — ABNORMAL HIGH (ref 0–200)
HDL: 77 mg/dL (ref >39)
Total CHOL/HDL Ratio: 2.7 Ratio
Triglycerides: 150 mg/dL — ABNORMAL HIGH (ref <150)
VLDL: 30 mg/dL (ref 0–40)

## 2011-11-14 LAB — POCT UA - MICROSCOPIC ONLY
Casts, Ur, LPF, POC: NEGATIVE
Yeast, UA: NEGATIVE

## 2011-11-14 LAB — COMPREHENSIVE METABOLIC PANEL
AST: 23 U/L (ref 0–37)
Alkaline Phosphatase: 79 U/L (ref 39–117)
BUN: 16 mg/dL (ref 6–23)
Creat: 0.92 mg/dL (ref 0.50–1.10)
Potassium: 3.9 mEq/L (ref 3.5–5.3)
Total Bilirubin: 0.5 mg/dL (ref 0.3–1.2)

## 2011-11-14 MED ORDER — AMLODIPINE BESYLATE 10 MG PO TABS: 10.0000 mg | ORAL_TABLET | Freq: Every day | ORAL | Status: DC

## 2011-11-14 MED ORDER — FLUOXETINE HCL (PMDD) 10 MG PO TABS: 10.0000 mg | ORAL_TABLET | Freq: Two times a day (BID) | ORAL | Status: DC

## 2011-11-14 MED ORDER — LISINOPRIL 20 MG PO TABS: 20.0000 mg | ORAL_TABLET | Freq: Every day | ORAL | Status: DC

## 2011-11-14 MED ORDER — HYDROCODONE-ACETAMINOPHEN 5-500 MG PO TABS: 1.0000 | ORAL_TABLET | Freq: Three times a day (TID) | ORAL | Status: AC | PRN

## 2011-11-14 NOTE — Progress Notes (Signed)
  Subjective:    Patient ID: Emily Lopez, female    DOB: December 15, 1955, 56 y.o.   MRN: [id number]  HPI Quit taking blood pressure meds 2 yrs ago. JUst decided to not take care of herself. Has depressed affect, anxiety. Married to my Emily Lopez. He sent her here. C/0 L arm pain , aches in humerus, painful to move , abduct L shoulder and appears to be frozen. No cp, diaphoresis, syncope. Did see cardiology and did stress test all neg. Was on lisinopril and naproxen at [facility] 2 yrs ago. 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 She appears well-developed and well-nourished. No distress.  HENT:  Right Ear: External ear normal.  Left Ear: External ear normal.  Eyes: EOM are normal. Pupils are equal, round, and reactive to light. No scleral icterus.  Neck: Normal range of motion. Neck supple. No tracheal deviation present. No thyromegaly present.  Cardiovascular: Normal rate, regular rhythm, S1 normal, S2 normal, intact distal pulses and normal pulses.  PMI is not displaced.  Exam reveals no gallop and no decreased pulses.   Murmur heard. Pulmonary/Chest: Effort normal and breath sounds normal. No respiratory distress.  Abdominal: Soft. Bowel sounds are normal. She exhibits no mass. There is no tenderness.  Musculoskeletal: She exhibits tenderness.  Neurological: She is alert and oriented to person, place, and time. She has normal reflexes. No cranial nerve deficit. Coordination normal.  Skin: Skin is warm and dry.  Psychiatric: Her behavior is normal. Judgment and thought content normal.    bp- right regular cuff, sitting  224/133 pulse 67       Left  Regular cuff sitting  222/121 pulse 66  ekg [facility] reading (PRIMARY) by  Dr. Perrin Maltese labs.  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

## 2011-11-14 NOTE — Patient Instructions (Signed)
Hypertension  As your heart beats, it forces blood through your arteries. This force is your blood pressure. If the pressure is too high, it is called hypertension (HTN) or high blood pressure. HTN is dangerous because you may have it and not know it. High blood pressure may mean that your heart has to work harder to pump blood. Your arteries may be narrow or stiff. The extra work puts you at risk for heart disease, stroke, and other problems.    Blood pressure consists of two numbers, a higher number over a lower, 110/72, for example. It is stated as "110 over 72." The ideal is below 120 for the top number (systolic) and under 80 for the bottom (diastolic). Write down your blood pressure today.  You should pay close attention to your blood pressure if you have certain conditions such as:   Heart failure.   Prior heart attack.   Diabetes   Chronic kidney disease.   Prior stroke.   Multiple risk factors for heart disease.  To see if you have HTN, your blood pressure should be measured while you are seated with your arm held at the level of the heart. It should be measured at least twice. A one-time elevated blood pressure reading (especially in the Emergency Department) does not mean that you need treatment. There may be conditions in which the blood pressure is different between your right and left arms. It is important to see your caregiver soon for a recheck.  Most people have essential hypertension which means that there is not a specific cause. This type of high blood pressure may be lowered by changing lifestyle factors such as:   Stress.   Smoking.   Lack of exercise.   Excessive weight.   Drug/tobacco/alcohol use.   Eating less salt.  Most people do not have symptoms from high blood pressure until it has caused damage to the body. Effective treatment can often prevent, delay or reduce that damage.  TREATMENT     When a cause has been identified, treatment for high blood pressure is directed at the cause. There are a large number of medications to treat HTN. These fall into several categories, and your caregiver will help you select the medicines that are best for you. Medications may have side effects. You should review side effects with your caregiver.  If your blood pressure stays high after you have made lifestyle changes or started on medicines,     Your medication(s) may need to be changed.   Other problems may need to be addressed.   Be certain you understand your prescriptions, and know how and when to take your medicine.   Be sure to follow up with your caregiver within the time frame advised (usually within two weeks) to have your blood pressure rechecked and to review your medications.   If you are taking more than one medicine to lower your blood pressure, make sure you know how and at what times they should be taken. Taking two medicines at the same time can result in blood pressure that is too low.  SEEK IMMEDIATE MEDICAL CARE IF:   You develop a severe headache, blurred or changing vision, or confusion.   You have unusual weakness or numbness, or a faint feeling.   You have severe chest or abdominal pain, vomiting, or breathing problems.  MAKE SURE YOU:     Understand these instructions.   Will watch your condition.   Will get help right away if you   are not doing well or get worse.  Document Released: 05/27/2005 Document Revised: 05/16/2011 Document Reviewed: 01/15/2008  ExitCare Patient Information 2012 [employer].    DASH Diet   The DASH diet stands for "Dietary Approaches to Stop Hypertension." It is a healthy eating plan that has been shown to reduce high blood pressure (hypertension) in as little as 14 days, while also possibly providing other significant health benefits. These other health benefits include reducing the risk of breast cancer after menopause and reducing the risk of type 2 diabetes, heart disease, colon cancer, and stroke. Health benefits also include weight loss and slowing kidney failure in patients with chronic kidney disease.    DIET GUIDELINES   Limit salt (sodium). Your diet should contain less than 1500 mg of sodium daily.   Limit refined or processed carbohydrates. Your diet should include mostly whole grains. Desserts and added sugars should be used sparingly.   Include small amounts of heart-healthy fats. These types of fats include nuts, oils, and tub margarine. Limit saturated and trans fats. These fats have been shown to be harmful in the body.  CHOOSING FOODS    The following food groups are based on a 2000 calorie diet. See your Registered Dietitian for individual calorie needs.  Grains and Grain Products (6 to 8 servings daily)   Eat More Often: Whole-wheat bread, brown rice, whole-grain or wheat pasta, quinoa, popcorn without added fat or salt (air popped).   Eat Less Often: White bread, white pasta, white rice, cornbread.  Vegetables (4 to 5 servings daily)   Eat More Often: Fresh, frozen, and canned vegetables. Vegetables may be raw, steamed, roasted, or grilled with a minimal amount of fat.   Eat Less Often/Avoid: Creamed or fried vegetables. Vegetables in a cheese sauce.  Fruit (4 to 5 servings daily)   Eat More Often: All fresh, canned (in natural juice), or frozen fruits. Dried fruits without added sugar. One hundred percent fruit juice ( cup [237 mL] daily).   Eat Less Often: Dried fruits with added sugar. Canned fruit in light or heavy syrup.   Lean Meats, Fish, and Poultry (2 servings or less daily. One serving is 3 to 4 oz [85-114 g]).   Eat More Often: Ninety percent or leaner ground beef, tenderloin, sirloin. Round cuts of beef, chicken breast, turkey breast. All fish. Grill, bake, or broil your meat. Nothing should be fried.   Eat Less Often/Avoid: Fatty cuts of meat, turkey, or chicken leg, thigh, or wing. Fried cuts of meat or fish.  Dairy (2 to 3 servings)   Eat More Often: Low-fat or fat-free milk, low-fat plain or light yogurt, reduced-fat or part-skim cheese.   Eat Less Often/Avoid: Milk (whole, 2%, skim, or chocolate). Whole milk yogurt. Full-fat cheeses.  Nuts, Seeds, and Legumes (4 to 5 servings per week)   Eat More Often: All without added salt.   Eat Less Often/Avoid: Salted nuts and seeds, canned beans with added salt.  Fats and Sweets (limited)   Eat More Often: Vegetable oils, tub margarines without trans fats, sugar-free gelatin. Mayonnaise and salad dressings.   Eat Less Often/Avoid: Coconut oils, palm oils, butter, stick margarine, cream, half and half, cookies, candy, pie.  FOR MORE INFORMATION  The Dash Diet Eating Plan: www.dashdiet.org  Document Released: 05/16/2011 Document Reviewed: 05/06/2011  [employer] Patient Information 2012 [employer].

## 2011-11-14 NOTE — Progress Notes (Signed)
  Subjective:    Patient ID: Emily Lopez, female    DOB: 16-Jul-1955, 56 y.o.   MRN: [id number]  HPI    Review of Systems     Objective:   Physical Exam   [facility] reading (PRIMARY) by  Dr.Cassi Jenne chest x-ray shows no acute disease left shoulder film is unremarkable for fracture.        Assessment & Plan:

## 2011-11-16 NOTE — Progress Notes (Signed)
Addended by: Jonita Albee on: 11/16/2011 09:09 AM   Modules accepted: Orders

## 2011-11-17 ENCOUNTER — Ambulatory Visit (INDEPENDENT_AMBULATORY_CARE_PROVIDER_SITE_OTHER): Admitting: Internal Medicine

## 2011-11-17 VITALS — BP 153/90 | HR 72 | Temp 98.9°F | Resp 20 | Ht 67.0 in | Wt 184.4 lb

## 2011-11-17 DIAGNOSIS — Z7189 Other specified counseling: Secondary | ICD-10-CM

## 2011-11-17 DIAGNOSIS — M25512 Pain in left shoulder: Secondary | ICD-10-CM

## 2011-11-17 DIAGNOSIS — Z79899 Other long term (current) drug therapy: Secondary | ICD-10-CM

## 2011-11-17 DIAGNOSIS — I10 Essential (primary) hypertension: Secondary | ICD-10-CM

## 2011-11-17 NOTE — Progress Notes (Signed)
  Subjective:    Patient ID: Emily Lopez, female    DOB: May 26, 1956, 56 y.o.   MRN: [id number]  HPI F/up severe htn, improved greatly, feels much better. Is motivated to eat healthy and to lose wt.   Review of Systems     Objective:   Physical Exam Normal exam except bp mildly high. Heart sounds quieter, softer.       Assessment & Plan:  Continue meds To see Dr. Maggie Font next week F/up with me 1 month and do bmet

## 2011-11-20 LAB — PROTEIN ELECTROPHORESIS, SERUM, WITH REFLEX
Albumin ELP: 56.5 % (ref 55.8–66.1)
Alpha-1-Globulin: 3.9 % (ref 2.9–4.9)
Alpha-2-Globulin: 9 % (ref 7.1–11.8)
Beta 2: 6.2 % (ref 3.2–6.5)
Beta Globulin: 5 % (ref 4.7–7.2)

## 2011-12-09 DIAGNOSIS — Z0271 Encounter for disability determination: Secondary | ICD-10-CM

## 2011-12-24 ENCOUNTER — Other Ambulatory Visit: Payer: Self-pay | Admitting: Family Medicine

## 2011-12-24 DIAGNOSIS — M2550 Pain in unspecified joint: Secondary | ICD-10-CM

## 2011-12-24 DIAGNOSIS — I10 Essential (primary) hypertension: Secondary | ICD-10-CM

## 2011-12-24 DIAGNOSIS — F419 Anxiety disorder, unspecified: Secondary | ICD-10-CM

## 2011-12-24 DIAGNOSIS — M25512 Pain in left shoulder: Secondary | ICD-10-CM

## 2011-12-24 MED ORDER — FLUOXETINE HCL (PMDD) 10 MG PO TABS: 10.0000 mg | ORAL_TABLET | Freq: Two times a day (BID) | ORAL | Status: DC

## 2011-12-24 MED ORDER — AMLODIPINE BESYLATE 10 MG PO TABS: 10.0000 mg | ORAL_TABLET | Freq: Every day | ORAL | Status: DC

## 2011-12-24 MED ORDER — LISINOPRIL 20 MG PO TABS: 20.0000 mg | ORAL_TABLET | Freq: Every day | ORAL | Status: DC

## 2012-06-15 ENCOUNTER — Emergency Department (HOSPITAL_COMMUNITY)
Admission: EM | Admit: 2012-06-15 | Discharge: 2012-06-16 | Disposition: A | Discharge status: Home / Self Care | Attending: Emergency Medicine | Admitting: Emergency Medicine

## 2012-06-15 ENCOUNTER — Encounter (HOSPITAL_COMMUNITY): Payer: Self-pay | Admitting: *Deleted

## 2012-06-15 ENCOUNTER — Emergency Department (HOSPITAL_COMMUNITY)

## 2012-06-15 DIAGNOSIS — Z8659 Personal history of other mental and behavioral disorders: Secondary | ICD-10-CM | POA: Insufficient documentation

## 2012-06-15 DIAGNOSIS — I10 Essential (primary) hypertension: Secondary | ICD-10-CM | POA: Insufficient documentation

## 2012-06-15 DIAGNOSIS — Z79899 Other long term (current) drug therapy: Secondary | ICD-10-CM | POA: Insufficient documentation

## 2012-06-15 DIAGNOSIS — R0609 Other forms of dyspnea: Secondary | ICD-10-CM | POA: Insufficient documentation

## 2012-06-15 DIAGNOSIS — R0989 Other specified symptoms and signs involving the circulatory and respiratory systems: Secondary | ICD-10-CM | POA: Insufficient documentation

## 2012-06-15 DIAGNOSIS — R0789 Other chest pain: Secondary | ICD-10-CM | POA: Insufficient documentation

## 2012-06-15 DIAGNOSIS — Z8701 Personal history of pneumonia (recurrent): Secondary | ICD-10-CM | POA: Insufficient documentation

## 2012-06-15 DIAGNOSIS — Z8739 Personal history of other diseases of the musculoskeletal system and connective tissue: Secondary | ICD-10-CM | POA: Insufficient documentation

## 2012-06-15 LAB — COMPREHENSIVE METABOLIC PANEL
Albumin: 3.8 g/dL (ref 3.5–5.2)
Alkaline Phosphatase: 96 U/L (ref 39–117)
BUN: 20 mg/dL (ref 6–23)
Chloride: 102 mEq/L (ref 96–112)
Creatinine, Ser: 1.09 mg/dL (ref 0.50–1.10)
GFR calc Af Amer: 65 mL/min — ABNORMAL LOW (ref >90)
Glucose, Bld: 139 mg/dL — ABNORMAL HIGH (ref 70–99)
Total Bilirubin: 0.4 mg/dL (ref 0.3–1.2)
Total Protein: 7.7 g/dL (ref 6.0–8.3)

## 2012-06-15 LAB — CBC WITH DIFFERENTIAL/PLATELET
Basophils Relative: 0 % (ref 0–1)
Eosinophils Absolute: 0.1 10*3/uL (ref 0.0–0.7)
HCT: 32.6 % — ABNORMAL LOW (ref 36.0–46.0)
Hemoglobin: 11.4 g/dL — ABNORMAL LOW (ref 12.0–15.0)
Lymphs Abs: 3 10*3/uL (ref 0.7–4.0)
MCH: 27.6 pg (ref 26.0–34.0)
MCHC: 35 g/dL (ref 30.0–36.0)
Monocytes Absolute: 0.3 10*3/uL (ref 0.1–1.0)
Monocytes Relative: 5 % (ref 3–12)
RBC: 4.13 MIL/uL (ref 3.87–5.11)

## 2012-06-15 LAB — TROPONIN I: Troponin I: <0.3 ng/mL (ref <0.30)

## 2012-06-15 NOTE — ED Notes (Signed)
Pt c/o chest pain; "squeezing" since 1 pm; states normally drinks a bottle of wine a day but didn't today because she didn't feel well

## 2012-06-15 NOTE — ED Notes (Signed)
Pt reports sharp pain in her left chest since 1:00 pm today, pt also reports left arm pain and "feeling funny". Pt refused PIV at this time stating  "I'm not sick you don't need to give me an IV".

## 2012-06-15 NOTE — ED Provider Notes (Signed)
History     CSN: [id number]  Arrival date & time 06/15/12  2057   First MD Initiated Contact with Patient 06/15/12 2304      Chief Complaint  Patient presents with  . Chest Pain    (Consider location/radiation/quality/duration/timing/severity/associated sxs/prior treatment) HPI This is a 57 year old female with a history of chest pain beginning about 1 PM today. The chest pain is described as sharp, intermittent. It occurs about every 15 minutes and is described as lasting only has a few seconds. It is not accompanied by dyspnea, diaphoresis or nausea but she has had dyspnea on exertion today which is unusual for her. She states she does sweat occasionally but this is not correlated with the chest pain. The chest pain is not brought on by, exacerbated by or relieved by anything that she is aware of. The pain is not severe but is intense enough to "get my attention". She denies fever, cough, nausea, diarrhea, abdominal pain or dysuria.  Past Medical History  Diagnosis Date  . Hypertension   . Pneumonia     2008 or 2009  . Depression   . Anxiety   . Arthritis     History reviewed. No pertinent past surgical history.  Family History  Problem Relation Age of Onset  . Hypertension Mother   . Hypertension Sister     History  Substance Use Topics  . Smoking status: Never Smoker   . Smokeless tobacco: Not on file  . Alcohol Use: Yes     Comment: occaisionally    OB History    Grav Para Term Preterm Abortions TAB SAB Ect Mult Living                  Review of Systems  All other systems reviewed and are negative.    Allergies  Review of patient's allergies indicates no known allergies.  Home Medications   Current Outpatient Rx  Name  Route  Sig  Dispense  Refill  . AMLODIPINE BESYLATE 10 MG PO TABS   Oral   Take 1 tablet (10 mg total) by mouth daily.   90 tablet   3   . LISINOPRIL 20 MG PO TABS   Oral   Take 1 tablet (20 mg total) by mouth daily.   90  tablet   3     BP 190/86  Pulse 71  Temp 98.4 F (36.9 C)  Resp 20  SpO2 100%  Physical Exam General: Well-developed, well-nourished female in no acute distress; appearance consistent with age of record HENT: normocephalic, atraumatic Eyes: pupils equal round and reactive to light; extraocular muscles intact Neck: supple Heart: regular rate and rhythm Lungs: clear to auscultation bilaterally Chest: Nontender Abdomen: soft; nondistended; bowel sounds present Extremities: No deformity; full range of motion; pulses normal; no edema Neurologic: Awake, alert and oriented; motor function intact in all extremities and symmetric; no facial droop Skin: Warm and dry Psychiatric: Normal mood and affect    ED Course  Procedures (including critical care time)     MDM   Nursing notes and vitals signs, including pulse oximetry, reviewed.  Summary of this visit's results, reviewed by myself:  Labs:  Results for orders placed during the hospital encounter of 06/15/12 (from the past 24 hour(s))  CBC WITH DIFFERENTIAL     Status: Abnormal   Collection Time   06/15/12  9:37 PM      Component Value Range   WBC 7.2  4.0 - 10.5 K/uL   RBC  4.13  3.87 - 5.11 MIL/uL   Hemoglobin 11.4 (*) 12.0 - 15.0 g/dL   HCT 45.4 (*) 09.8 - 11.9 %   MCV 78.9  78.0 - 100.0 fL   MCH 27.6  26.0 - 34.0 pg   MCHC 35.0  30.0 - 36.0 g/dL   RDW 14.7 (*) 82.9 - 56.2 %   Platelets 309  150 - 400 K/uL   Neutrophils Relative 53  43 - 77 %   Neutro Abs 3.8  1.7 - 7.7 K/uL   Lymphocytes Relative 41  12 - 46 %   Lymphs Abs 3.0  0.7 - 4.0 K/uL   Monocytes Relative 5  3 - 12 %   Monocytes Absolute 0.3  0.1 - 1.0 K/uL   Eosinophils Relative 1  0 - 5 %   Eosinophils Absolute 0.1  0.0 - 0.7 K/uL   Basophils Relative 0  0 - 1 %   Basophils Absolute 0.0  0.0 - 0.1 K/uL  COMPREHENSIVE METABOLIC PANEL     Status: Abnormal   Collection Time   06/15/12  9:37 PM      Component Value Range   Sodium 139  135 - 145 mEq/L     Potassium 3.5  3.5 - 5.1 mEq/L   Chloride 102  96 - 112 mEq/L   CO2 25  19 - 32 mEq/L   Glucose, Bld 139 (*) 70 - 99 mg/dL   BUN 20  6 - 23 mg/dL   Creatinine, Ser 1.30  0.50 - 1.10 mg/dL   Calcium 9.5  8.4 - 86.5 mg/dL   Total Protein 7.7  6.0 - 8.3 g/dL   Albumin 3.8  3.5 - 5.2 g/dL   AST 20  0 - 37 U/L   ALT 15  0 - 35 U/L   Alkaline Phosphatase 96  39 - 117 U/L   Total Bilirubin 0.4  0.3 - 1.2 mg/dL   GFR calc non Af Amer 56 (*) >90 mL/min   GFR calc Af Amer 65 (*) >90 mL/min  TROPONIN I     Status: Normal   Collection Time   06/15/12  9:37 PM      Component Value Range   Troponin I <0.30  <0.30 ng/mL  TROPONIN I     Status: Normal   Collection Time   06/15/12 11:50 PM      Component Value Range   Troponin I <0.30  <0.30 ng/mL    Imaging Studies: Dg Chest 2 View  06/15/2012  *RADIOLOGY REPORT*  Clinical Data: Chest pain, hypertension.  CHEST - 2 VIEW  Comparison: 11/14/2011.  Findings: Normal heart size with clear lung fields.  No bony abnormality.  Mildly tortuous aorta.  No effusion or pneumothorax. No significant change from priors.  IMPRESSION: No active disease.   Original Report Authenticated By: Davonna Belling, M.D.     EKG Interpretation:  Date & Time: 06/15/2012 9:04 PM  Rate: 67  Rhythm: normal sinus rhythm  QRS Axis: left  Intervals: normal  ST/T Wave abnormalities: normal  Conduction Disutrbances:none  Narrative Interpretation:   Old EKG Reviewed: Axis has changed  2:36 AM Patient asymptomatic. Patient's story is not typical for cardiac chest pain. No evidence of cardiac ischemia in ED. She will followup with her primary care physician.          Hanley Seamen, MD 06/16/12 [phone]

## 2012-06-16 LAB — TROPONIN I: Troponin I: <0.3 ng/mL (ref <0.30)

## 2012-07-01 ENCOUNTER — Other Ambulatory Visit: Payer: Self-pay | Admitting: Internal Medicine

## 2012-08-04 ENCOUNTER — Other Ambulatory Visit: Payer: Self-pay | Admitting: Internal Medicine

## 2012-08-05 ENCOUNTER — Telehealth: Payer: Self-pay | Admitting: *Deleted

## 2012-08-05 NOTE — Telephone Encounter (Signed)
[facility] requesting refill on hydrocodone. Last fill on 11/14/11

## 2012-08-11 ENCOUNTER — Other Ambulatory Visit: Payer: Self-pay | Admitting: Radiology

## 2012-08-11 NOTE — Telephone Encounter (Signed)
RTC

## 2012-08-14 ENCOUNTER — Emergency Department (HOSPITAL_COMMUNITY)
Admission: EM | Admit: 2012-08-14 | Discharge: 2012-08-14 | Disposition: A | Discharge status: Home / Self Care | Attending: Emergency Medicine | Admitting: Emergency Medicine

## 2012-08-14 ENCOUNTER — Encounter (HOSPITAL_COMMUNITY): Payer: Self-pay | Admitting: *Deleted

## 2012-08-14 DIAGNOSIS — Z8701 Personal history of pneumonia (recurrent): Secondary | ICD-10-CM | POA: Insufficient documentation

## 2012-08-14 DIAGNOSIS — M7918 Myalgia, other site: Secondary | ICD-10-CM

## 2012-08-14 DIAGNOSIS — R296 Repeated falls: Secondary | ICD-10-CM | POA: Insufficient documentation

## 2012-08-14 DIAGNOSIS — S4980XA Other specified injuries of shoulder and upper arm, unspecified arm, initial encounter: Secondary | ICD-10-CM | POA: Insufficient documentation

## 2012-08-14 DIAGNOSIS — Y939 Activity, unspecified: Secondary | ICD-10-CM | POA: Insufficient documentation

## 2012-08-14 DIAGNOSIS — IMO0002 Reserved for concepts with insufficient information to code with codable children: Secondary | ICD-10-CM | POA: Insufficient documentation

## 2012-08-14 DIAGNOSIS — I10 Essential (primary) hypertension: Secondary | ICD-10-CM | POA: Insufficient documentation

## 2012-08-14 DIAGNOSIS — Z79899 Other long term (current) drug therapy: Secondary | ICD-10-CM | POA: Insufficient documentation

## 2012-08-14 DIAGNOSIS — W208XXA Other cause of strike by thrown, projected or falling object, initial encounter: Secondary | ICD-10-CM | POA: Insufficient documentation

## 2012-08-14 DIAGNOSIS — S46909A Unspecified injury of unspecified muscle, fascia and tendon at shoulder and upper arm level, unspecified arm, initial encounter: Secondary | ICD-10-CM | POA: Insufficient documentation

## 2012-08-14 DIAGNOSIS — Z8659 Personal history of other mental and behavioral disorders: Secondary | ICD-10-CM | POA: Insufficient documentation

## 2012-08-14 DIAGNOSIS — Y929 Unspecified place or not applicable: Secondary | ICD-10-CM | POA: Insufficient documentation

## 2012-08-14 DIAGNOSIS — S8990XA Unspecified injury of unspecified lower leg, initial encounter: Secondary | ICD-10-CM | POA: Insufficient documentation

## 2012-08-14 DIAGNOSIS — Z8739 Personal history of other diseases of the musculoskeletal system and connective tissue: Secondary | ICD-10-CM | POA: Insufficient documentation

## 2012-08-14 MED ORDER — OXYCODONE-ACETAMINOPHEN 5-325 MG PO TABS: 1.0000 | ORAL_TABLET | ORAL | Status: DC | PRN

## 2012-08-14 MED ORDER — OXYCODONE-ACETAMINOPHEN 5-325 MG PO TABS
2.0000 | ORAL_TABLET | Freq: Once | ORAL | Status: AC
  Administered 2012-08-14: 2 via ORAL
  Filled 2012-08-14: qty 2

## 2012-08-14 MED ORDER — DIAZEPAM 5 MG PO TABS: 5.0000 mg | ORAL_TABLET | Freq: Four times a day (QID) | ORAL | Status: DC | PRN

## 2012-08-14 NOTE — ED Notes (Signed)
Pt struck on L shoulder by falling tree limb this morning at 0700. Pt fell onto R knee, injuring it, also. Pt c/o L shoulder, L mid-low back pain and R knee pain. Pt in no acute distress and is ambulatory at this time. Pt denies LoC and head injury.
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
## 2012-08-14 NOTE — ED Notes (Signed)
Pt has a ride home.  

## 2012-08-14 NOTE — ED Notes (Signed)
Pt ambulatory exam room with steady gait.

## 2012-08-15 NOTE — ED Provider Notes (Signed)
History/physical exam/procedure(s) were performed by non-physician practitioner and as supervising physician I was immediately available for consultation/collaboration. I have reviewed all notes and am in agreement with care and plan.   Hilario Quarry, MD 08/15/12 [phone]

## 2012-08-18 ENCOUNTER — Ambulatory Visit

## 2012-08-18 ENCOUNTER — Ambulatory Visit (INDEPENDENT_AMBULATORY_CARE_PROVIDER_SITE_OTHER): Admitting: Family Medicine

## 2012-08-18 VITALS — BP 146/92 | HR 88 | Temp 98.2°F | Resp 18 | Wt 177.0 lb

## 2012-08-18 DIAGNOSIS — T148XXA Other injury of unspecified body region, initial encounter: Secondary | ICD-10-CM

## 2012-08-18 DIAGNOSIS — M549 Dorsalgia, unspecified: Secondary | ICD-10-CM

## 2012-08-18 DIAGNOSIS — M25512 Pain in left shoulder: Secondary | ICD-10-CM

## 2012-08-18 DIAGNOSIS — M25519 Pain in unspecified shoulder: Secondary | ICD-10-CM

## 2012-08-18 DIAGNOSIS — J019 Acute sinusitis, unspecified: Secondary | ICD-10-CM

## 2012-08-18 MED ORDER — CYCLOBENZAPRINE HCL 10 MG PO TABS: 5.0000 mg | ORAL_TABLET | Freq: Three times a day (TID) | ORAL | Status: DC | PRN

## 2012-08-18 MED ORDER — FLUTICASONE PROPIONATE 50 MCG/ACT NA SUSP: 2.0000 | Freq: Every day | NASAL | Status: DC

## 2012-08-18 MED ORDER — AMOXICILLIN-POT CLAVULANATE 875-125 MG PO TABS: 1.0000 | ORAL_TABLET | Freq: Two times a day (BID) | ORAL | Status: DC

## 2012-08-18 MED ORDER — DICLOFENAC SODIUM 75 MG PO TBEC: 75.0000 mg | DELAYED_RELEASE_TABLET | Freq: Two times a day (BID) | ORAL | Status: DC
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
## 2012-09-21 ENCOUNTER — Ambulatory Visit: Admitting: Internal Medicine

## 2012-09-21 ENCOUNTER — Encounter: Payer: Self-pay | Admitting: *Deleted

## 2012-09-22 ENCOUNTER — Ambulatory Visit: Admitting: Cardiology

## 2012-09-22 ENCOUNTER — Ambulatory Visit

## 2012-09-22 ENCOUNTER — Ambulatory Visit (INDEPENDENT_AMBULATORY_CARE_PROVIDER_SITE_OTHER): Admitting: Emergency Medicine

## 2012-09-22 VITALS — BP 138/72 | HR 87 | Temp 99.0°F | Resp 18 | Ht 67.5 in | Wt 170.0 lb

## 2012-09-22 DIAGNOSIS — M25572 Pain in left ankle and joints of left foot: Secondary | ICD-10-CM

## 2012-09-22 DIAGNOSIS — M25579 Pain in unspecified ankle and joints of unspecified foot: Secondary | ICD-10-CM

## 2012-09-22 DIAGNOSIS — S93409A Sprain of unspecified ligament of unspecified ankle, initial encounter: Secondary | ICD-10-CM

## 2012-09-22 MED ORDER — HYDROCODONE-ACETAMINOPHEN 5-325 MG PO TABS: 1.0000 | ORAL_TABLET | ORAL | Status: DC | PRN

## 2012-09-22 NOTE — Patient Instructions (Addendum)
Ankle Sprain  An ankle sprain is an injury to the strong, fibrous tissues (ligaments) that hold the bones of your ankle joint together.   CAUSES  An ankle sprain is usually caused by a fall or by twisting your ankle. Ankle sprains most commonly occur when you step on the outer edge of your foot, and your ankle turns inward. People who participate in sports are more prone to these types of injuries.   SYMPTOMS    Pain in your ankle. The pain may be present at rest or only when you are trying to stand or walk.   Swelling.   Bruising. Bruising may develop immediately or within 1 to 2 days after your injury.   Difficulty standing or walking, particularly when turning corners or changing directions.  DIAGNOSIS   Your caregiver will ask you details about your injury and perform a physical exam of your ankle to determine if you have an ankle sprain. During the physical exam, your caregiver will press on and apply pressure to specific areas of your foot and ankle. Your caregiver will try to move your ankle in certain ways. An X-ray exam may be done to be sure a bone was not broken or a ligament did not separate from one of the bones in your ankle (avulsion fracture).   TREATMENT   Certain types of braces can help stabilize your ankle. Your caregiver can make a recommendation for this. Your caregiver may recommend the use of medicine for pain. If your sprain is severe, your caregiver may refer you to a surgeon who helps to restore function to parts of your skeletal system (orthopedist) or a physical therapist.  HOME CARE INSTRUCTIONS    Apply ice to your injury for 1 to 2 days or as directed by your caregiver. Applying ice helps to reduce inflammation and pain.   Put ice in a plastic bag.   Place a towel between your skin and the bag.   Leave the ice on for 15 to 20 minutes at a time, every 2 hours while you are awake.   Only take over-the-counter or prescription medicines for pain, discomfort, or fever as directed  by your caregiver.   Keep your injured leg elevated, when possible, to lessen swelling.   If your caregiver recommends crutches, use them as instructed. Gradually put weight on the affected ankle. Continue to use crutches or a cane until you can walk without feeling pain in your ankle.   If you have a plaster splint, wear the splint as directed by your caregiver. Do not rest it on anything harder than a pillow for the first 24 hours. Do not put weight on it. Do not get it wet. You may take it off to take a shower or bath.   You may have been given an elastic bandage to wear around your ankle to provide support. If the elastic bandage is too tight (you have numbness or tingling in your foot or your foot becomes cold and blue), adjust the bandage to make it comfortable.   If you have an air splint, you may blow more air into it or let air out to make it more comfortable. You may take your splint off at night and before taking a shower or bath.   Wiggle your toes in the splint several times per day to decrease swelling.  SEEK MEDICAL CARE IF:    You have an increase in bruising, swelling, or pain.   Your toes feel extremely cold   or you lose feeling in your foot.   Your pain is not relieved with medicine.  SEEK IMMEDIATE MEDICAL CARE IF:   Your toes are numb or blue.   You have severe pain.  MAKE SURE YOU:    Understand these instructions.   Will watch your condition.   Will get help right away if you are not doing well or get worse.  Document Released: 05/27/2005 Document Revised: 08/19/2011 Document Reviewed: 06/08/2011  [employer] Patient Information 2013 [employer].

## 2012-09-22 NOTE — Progress Notes (Addendum)
[facility] [address], [place] [address] [phone]  Date:  09/22/2012   Name:  Emily Lopez   DOB:  1956-01-06   MRN:  [id number]  PCP:  Tally Due, MD    Chief Complaint: Fall, Knee Pain, Foot Pain and Back Pain   History of Present Illness:  Emily Lopez is a 57 y.o. very pleasant female patient who presents with the following:  History of shoulder, back and knee pain under treatment with NSAID, flexeril and percocet.  This morning she fell on the stairs and went down the last few steps, injuring her left ankle and foot.  She has pain with ambulation.  Has some residual pain in back and shoulder that was previously treated but not worsened.jo Denies other complaint or health concern today.   Patient Active Problem List  Diagnosis  . VITAMIN D DEFICIENCY  . OVERWEIGHT  . ANEMIA, IRON DEFICIENCY, UNSPEC.  Marland Kitchen DEPRESSIVE DISORDER  . ATTENTION DEFICIT DISORDER, ADULT  . HYPERTENSION, BENIGN SYSTEMIC  . GERD  . WRIST PAIN, LEFT  . MUSCLE PAIN  . LEG PAIN, BILATERAL  . SYSTOLIC MURMUR    Past Medical History  Diagnosis Date  . Hypertension   . Pneumonia     2008 or 2009  . Depression   . Anxiety   . Arthritis   . Iron deficiency anemia, unspecified   . Attention deficit disorder without mention of hyperactivity   . Esophageal reflux   . Myalgia and myositis, unspecified   . Undiagnosed cardiac murmurs   . Unspecified vitamin D deficiency     History reviewed. No pertinent past surgical history.  History  Substance Use Topics  . Smoking status: Never Smoker   . Smokeless tobacco: Former Neurosurgeon    Types: Snuff    Quit date: 07/24/2004  . Alcohol Use: Yes     Comment: occasionally    Family History  Problem Relation Age of Onset  . Hypertension Mother   . Diabetes Mother   . Arthritis Mother   . Hypertension Sister   . Lung cancer Father   . Heart attack Father   . Diabetes Brother     No Known  Allergies  Medication list has been reviewed and updated.  Current Outpatient Prescriptions on File Prior to Visit  Medication Sig Dispense Refill  . amLODipine (NORVASC) 10 MG tablet Take 1 tablet (10 mg total) by mouth daily.  90 tablet  3  . diclofenac (VOLTAREN) 75 MG EC tablet Take 1 tablet (75 mg total) by mouth 2 (two) times daily.  30 tablet  1  . fluticasone (FLONASE) 50 MCG/ACT nasal spray Place 2 sprays into the nose daily.  16 g  6  . lisinopril (PRINIVIL,ZESTRIL) 20 MG tablet Take 1 tablet (20 mg total) by mouth daily.  90 tablet  3  . Multiple Vitamin (MULTIVITAMIN WITH MINERALS) TABS Take 1 tablet by mouth daily.       No current facility-administered medications on file prior to visit.    Review of Systems:  As per HPI, otherwise negative.    Physical Examination: Filed Vitals:   09/22/12 0809  BP: 138/72  Pulse: 87  Temp: 99 F (37.2 C)  Resp: 18   Filed Vitals:   09/22/12 0809  Height: 5' 7.5" (1.715 m)  Weight: 170 lb (77.111 kg)   Body mass index is 26.22 kg/(m^2). Ideal Body Weight: Weight in (lb) to have BMI = 25: 161.7  GEN: WDWN, NAD, Non-toxic, Alert & Oriented x 3 HEENT: Atraumatic, Normocephalic.  Ears and Nose: No external deformity. EXTR: No clubbing/cyanosis/edema NEURO: Normal gait.  PSYCH: Normally interactive. Conversant. Not depressed or anxious appearing.  Calm demeanor.  Left Ankle:  Tender and swollen.  Guards inversion.  No deformity or ecchymosis Foot:  Tender lateral foot  Assessment and Plan: Sprain ankle Air cast  vicodin Follow up with Dr Ralene Bathe and elevate  Signed,  Phillips Odor, MD   [facility] reading (PRIMARY) by  Dr. Dareen Piano.  Negative ankle.  [facility] reading (PRIMARY) by  Dr. Dareen Piano.  Negative foot.

## 2012-09-23 ENCOUNTER — Ambulatory Visit (INDEPENDENT_AMBULATORY_CARE_PROVIDER_SITE_OTHER): Admitting: Cardiology

## 2012-09-23 ENCOUNTER — Encounter: Payer: Self-pay | Admitting: Cardiology

## 2012-09-23 VITALS — BP 128/82 | HR 73 | Ht 67.5 in | Wt 173.0 lb

## 2012-09-23 DIAGNOSIS — R011 Cardiac murmur, unspecified: Secondary | ICD-10-CM

## 2012-09-23 DIAGNOSIS — I10 Essential (primary) hypertension: Secondary | ICD-10-CM

## 2012-09-23 MED ORDER — ASPIRIN EC 81 MG PO TBEC: 81.0000 mg | DELAYED_RELEASE_TABLET | Freq: Every day | ORAL | Status: DC

## 2012-09-23 NOTE — Progress Notes (Signed)
Patient ID: Emily Lopez, female   DOB: 1955-10-17, 57 y.o.   MRN: [id number] PCP: Dr. Perrin Maltese  57 y.o. with history of HTN presents for cardiology evaluation.  She wants to start more regular exercise.  She has been doing well in general.  No exertional chest pain.  No exertional dyspnea.  She is not doing much formal exercise but she is active.  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  Her BP has been difficult to control historically but currently is controlled on amlodipine and lisinopril.  It is 128/82 today.  No syncope or lightheadedness.  She did use phen-fen for about 6 months a number of years ago.  She had an echo 6-7 years ago that per her report looked normal. She does not smoke or have diabetes.  Lipids in 6/13 were reasonable.  Father had CHF but died at 70.  She is concerned because she has been told that her heart is enlarged by CXR.     PMH: 1. HTN: Developed in her late 57s.  2. Left shoulder adhesive capsulitis 3. Depression 4. Past phen-fen use.   SH: Nonsmoker, separated, works in child care, family in [place].    FH: Father with CHF, died at 82.  HTN.    ROS: All systems reviewed and negative except as per HPI.   Current Outpatient Prescriptions  Medication Sig Dispense Refill  . amLODipine (NORVASC) 10 MG tablet Take 1 tablet (10 mg total) by mouth daily.  90 tablet  3  . diclofenac (VOLTAREN) 75 MG EC tablet Take 1 tablet (75 mg total) by mouth 2 (two) times daily.  30 tablet  1  . fluticasone (FLONASE) 50 MCG/ACT nasal spray Place 2 sprays into the nose daily.  16 g  6  . HYDROcodone-acetaminophen (NORCO) 5-325 MG per tablet Take 1 tablet by mouth every 4 (four) hours as needed.  30 tablet  0  . lisinopril (PRINIVIL,ZESTRIL) 20 MG tablet Take 1 tablet (20 mg total) by mouth daily.  90 tablet  3  . Multiple Vitamin (MULTIVITAMIN WITH MINERALS) TABS Take 1 tablet by mouth daily.      Marland Kitchen aspirin EC 81 MG tablet Take 1 tablet (81 mg total) by mouth daily.  90  tablet  3   No current facility-administered medications for this visit.   BP 128/82  Pulse 73  Ht 5' 7.5" (1.715 m)  Wt 173 lb (78.472 kg)  BMI 26.68 kg/m2 General: NAD Neck: No JVD, no thyromegaly or thyroid nodule.  Lungs: Clear to auscultation bilaterally with normal respiratory effort. CV: Nondisplaced PMI.  Heart regular S1/S2, no S3/S4, 2/6 early SEM RUSB.  No peripheral edema.  No carotid bruit.  Normal pedal pulses.  Abdomen: Soft, nontender, no hepatosplenomegaly, no distention.  Skin: Intact without lesions or rashes.  Neurologic: Alert and oriented x 3.  Psych: Normal affect. Extremities: No clubbing or cyanosis.  HEENT: Normal.   Assessment/Plan: 1. HTN: Seems to be well-controlled now.  She will continue amlodipine and lisinopril at current doses. She needs to watch sodium intake (relatively high sodium diet).  I think that it would be reasonable to get an echocardiogram given long-standing HTN and enlarged cardiac silhouette on CXR.   2. Murmur: Patient has an ejection-type upper sternal border murmur.  She does, of note, have a history of relatively brief phen-fen usage.  Will investigate murmur with echo.   3. Cardiovascular risk: No ischemic symptoms.  I do not think that a stress  test is necessary.  Lipids looked ok in 6/13.  She will be getting lipids with Dr. Perrin Maltese soon at her physical.  I asked her to have the labs forwarded to me.  I think it would be reasonable to take ASA 81 mg daily for CVA and MI prevention.    Marca Ancona 09/23/2012

## 2012-09-23 NOTE — Patient Instructions (Addendum)
Your physician wants you to follow-up in: 12 months with Dr Karmen Bongo will receive a reminder letter in the mail two months in advance. If you don't receive a letter, please call our office to schedule the follow-up appointment.   Your physician has requested that you have an echocardiogram. Echocardiography is a painless test that uses sound waves to create images of your heart. It provides your doctor with information about the size and shape of your heart and how well your heart's chambers and valves are working. This procedure takes approximately one hour. There are no restrictions for this procedure.  Your physician has recommended you make the following change in your medication:  1) Take Aspirin 81mg  daily  2 Gram Low Sodium Diet A 2 gram sodium diet restricts the amount of sodium in the diet to no more than 2 g or 2000 mg daily. Limiting the amount of sodium is often used to help lower blood pressure. It is important if you have heart, liver, or kidney problems. Many foods contain sodium for flavor and sometimes as a preservative. When the amount of sodium in a diet needs to be low, it is important to know what to look for when choosing foods and drinks. The following includes some information and guidelines to help make it easier for you to adapt to a low sodium diet. QUICK TIPS  Do not add salt to food.  Avoid convenience items and fast food.  Choose unsalted snack foods.  Buy lower sodium products, often labeled as "lower sodium" or "no salt added."  Check food labels to learn how much sodium is in 1 serving.  When eating at a restaurant, ask that your food be prepared with less salt or none, if possible. READING FOOD LABELS FOR SODIUM INFORMATION The nutrition facts label is a good place to find how much sodium is in foods. Look for products with no more than 500 to 600 mg of sodium per meal and no more than 150 mg per serving. Remember that 2 g = 2000 mg. The food label may  also list foods as:  Sodium-free: Less than 5 mg in a serving.  Very low sodium: 35 mg or less in a serving.  Low-sodium: 140 mg or less in a serving.  Light in sodium: 50% less sodium in a serving. For example, if a food that usually has 300 mg of sodium is changed to become light in sodium, it will have 150 mg of sodium.  Reduced sodium: 25% less sodium in a serving. For example, if a food that usually has 400 mg of sodium is changed to reduced sodium, it will have 300 mg of sodium. CHOOSING FOODS Grains  Avoid: Salted crackers and snack items. Some cereals, including instant hot cereals. Bread stuffing and biscuit mixes. Seasoned rice or pasta mixes.  Choose: Unsalted snack items. Low-sodium cereals, oats, puffed wheat and rice, shredded wheat. English muffins and bread. Pasta. Meats  Avoid: Salted, canned, smoked, spiced, pickled meats, including fish and poultry. Bacon, ham, sausage, cold cuts, hot dogs, anchovies.  Choose: Low-sodium canned tuna and salmon. Fresh or frozen meat, poultry, and fish. Dairy  Avoid: Processed cheese and spreads. Cottage cheese. Buttermilk and condensed milk. Regular cheese.  Choose: Milk. Low-sodium cottage cheese. Yogurt. Sour cream. Low-sodium cheese. Fruits and Vegetables  Avoid: Regular canned vegetables. Regular canned tomato sauce and paste. Frozen vegetables in sauces. Olives. Rosita Fire. Relishes. Sauerkraut.  Choose: Low-sodium canned vegetables. Low-sodium tomato sauce and paste. Frozen or fresh vegetables.  Fresh and frozen fruit. Condiments  Avoid: Canned and packaged gravies. Worcestershire sauce. Tartar sauce. Barbecue sauce. Soy sauce. Steak sauce. Ketchup. Onion, garlic, and table salt. Meat flavorings and tenderizers.  Choose: Fresh and dried herbs and spices. Low-sodium varieties of mustard and ketchup. Lemon juice. Tabasco sauce. Horseradish. SAMPLE 2 GRAM SODIUM MEAL PLAN Breakfast / Sodium (mg)  1 cup low-fat milk / 143  mg  2 slices whole-wheat toast / 270 mg  1 tbs heart-healthy margarine / 153 mg  1 hard-boiled egg / 139 mg  1 small orange / 0 mg Lunch / Sodium (mg)  1 cup raw carrots / 76 mg   cup hummus / 298 mg  1 cup low-fat milk / 143 mg   cup red grapes / 2 mg  1 whole-wheat pita bread / 356 mg Dinner / Sodium (mg)  1 cup whole-wheat pasta / 2 mg  1 cup low-sodium tomato sauce / 73 mg  3 oz lean ground beef / 57 mg  1 small side salad (1 cup raw spinach leaves,  cup cucumber,  cup yellow bell pepper) with 1 tsp olive oil and 1 tsp red wine vinegar / 25 mg Snack / Sodium (mg)  1 container low-fat vanilla yogurt / 107 mg  3 graham cracker squares / 127 mg Nutrient Analysis  Calories: 2033  Protein: 77 g  Carbohydrate: 282 g  Fat: 72 g  Sodium: 1971 mg Document Released: 05/27/2005 Document Revised: 08/19/2011 Document Reviewed: 08/28/2009 [facility] Patient Information 2013 [place], [place].

## 2012-09-28 ENCOUNTER — Ambulatory Visit (HOSPITAL_COMMUNITY): Attending: Internal Medicine | Admitting: Radiology

## 2012-09-28 DIAGNOSIS — R011 Cardiac murmur, unspecified: Secondary | ICD-10-CM | POA: Insufficient documentation

## 2012-09-28 NOTE — Progress Notes (Signed)
Echocardiogram performed.  

## 2012-10-12 ENCOUNTER — Encounter: Payer: Self-pay | Admitting: *Deleted

## 2012-10-20 ENCOUNTER — Telehealth: Payer: Self-pay | Admitting: Cardiology

## 2012-10-20 NOTE — Telephone Encounter (Signed)
Called patient with echo results.

## 2012-10-20 NOTE — Telephone Encounter (Signed)
New Prob     Pt received a phone call to call office to discuss results of recent ECHO. Please call.

## 2012-12-03 ENCOUNTER — Ambulatory Visit (INDEPENDENT_AMBULATORY_CARE_PROVIDER_SITE_OTHER): Admitting: Emergency Medicine

## 2012-12-03 ENCOUNTER — Ambulatory Visit

## 2012-12-03 VITALS — BP 157/87 | HR 75 | Temp 98.1°F | Resp 18 | Ht 67.0 in | Wt 174.0 lb

## 2012-12-03 DIAGNOSIS — M545 Low back pain: Secondary | ICD-10-CM

## 2012-12-03 DIAGNOSIS — M5010 Cervical disc disorder with radiculopathy, unspecified cervical region: Secondary | ICD-10-CM

## 2012-12-03 DIAGNOSIS — M5412 Radiculopathy, cervical region: Secondary | ICD-10-CM

## 2012-12-03 DIAGNOSIS — M25512 Pain in left shoulder: Secondary | ICD-10-CM

## 2012-12-03 DIAGNOSIS — M25562 Pain in left knee: Secondary | ICD-10-CM

## 2012-12-03 DIAGNOSIS — M542 Cervicalgia: Secondary | ICD-10-CM

## 2012-12-03 DIAGNOSIS — M25569 Pain in unspecified knee: Secondary | ICD-10-CM

## 2012-12-03 DIAGNOSIS — M25519 Pain in unspecified shoulder: Secondary | ICD-10-CM

## 2012-12-03 LAB — POCT URINALYSIS DIPSTICK
Ketones, UA: NEGATIVE
Protein, UA: NEGATIVE
Spec Grav, UA: 1.02
pH, UA: 5

## 2012-12-03 MED ORDER — MELOXICAM 7.5 MG PO TABS: 7.5000 mg | ORAL_TABLET | Freq: Every day | ORAL | Status: DC

## 2012-12-03 MED ORDER — GABAPENTIN 100 MG PO CAPS: ORAL_CAPSULE | ORAL | Status: DC

## 2012-12-03 NOTE — Patient Instructions (Addendum)

## 2012-12-03 NOTE — Progress Notes (Signed)
  Subjective:    Patient ID: Emily Lopez, female    DOB: 11-14-1955, 57 y.o.   MRN: [id number]  HPI patient states she was in her usual state of health until last March. She was working on a car and a limb fell and struck her across the neck left shoulder and left upper back. She was seen and x-rays were done and were without fractures. She enters today with persistent pain along the left periscapular area around the left shoulder. She has pain in the lower back. She has pain in her left knee and difficulty walking. She has not been to physical therapy she has not been to an orthopedist. She is asking about chiropractic care.    Review of Systems     Objective:   Physical Exam is tenderness in the left paracervical area and left periscapular area. There is pain with elevation of the left arm. Deep tendon reflexes of the upper chimneys are 2+ and symmetrical. Examination of lower back reveals tenderness in the left SI joint. There are reflexes present in both knees. She has degenerative changes around the left knee with pain on flexion extension.  [facility] reading (PRIMARY) by  Dr. Cleta Alberts C5-C6 degenerative disc disease. X-rays of the knee show spurring on the patella with mild joint space narrowing and arthritic change         Assessment & Plan:  We'll schedule an MRI of the neck. She's going to try chiropractic care for her discomfort. I told her if she did not respond we could refer her to orthopedics. She's going to schedule full physical with Dr. Perrin Maltese for she will be on Mobic 7.5 one every morning she will be on Neurontin 100 mg 1 to 2 at night

## 2012-12-16 ENCOUNTER — Ambulatory Visit
Admission: RE | Admit: 2012-12-16 | Discharge: 2012-12-16 | Disposition: A | Discharge status: Home / Self Care | Source: Ambulatory Visit | Attending: Emergency Medicine | Admitting: Emergency Medicine

## 2012-12-16 DIAGNOSIS — M5010 Cervical disc disorder with radiculopathy, unspecified cervical region: Secondary | ICD-10-CM

## 2012-12-17 ENCOUNTER — Other Ambulatory Visit: Payer: Self-pay | Admitting: Radiology

## 2012-12-17 DIAGNOSIS — M542 Cervicalgia: Secondary | ICD-10-CM

## 2012-12-17 DIAGNOSIS — M5090 Cervical disc disorder, unspecified, unspecified cervical region: Secondary | ICD-10-CM

## 2012-12-18 ENCOUNTER — Other Ambulatory Visit: Payer: Self-pay | Admitting: Physician Assistant

## 2012-12-23 ENCOUNTER — Other Ambulatory Visit: Payer: Self-pay | Admitting: Internal Medicine

## 2012-12-24 ENCOUNTER — Other Ambulatory Visit: Payer: Self-pay | Admitting: Internal Medicine

## 2012-12-24 ENCOUNTER — Ambulatory Visit (INDEPENDENT_AMBULATORY_CARE_PROVIDER_SITE_OTHER): Admitting: Family Medicine

## 2012-12-24 ENCOUNTER — Other Ambulatory Visit: Payer: Self-pay | Admitting: Emergency Medicine

## 2012-12-24 ENCOUNTER — Encounter: Payer: Self-pay | Admitting: Family Medicine

## 2012-12-24 VITALS — BP 140/80 | HR 70 | Temp 98.5°F | Resp 16 | Ht 66.0 in | Wt 171.4 lb

## 2012-12-24 DIAGNOSIS — Z1231 Encounter for screening mammogram for malignant neoplasm of breast: Secondary | ICD-10-CM

## 2012-12-24 DIAGNOSIS — E785 Hyperlipidemia, unspecified: Secondary | ICD-10-CM

## 2012-12-24 DIAGNOSIS — I10 Essential (primary) hypertension: Secondary | ICD-10-CM

## 2012-12-24 DIAGNOSIS — Z124 Encounter for screening for malignant neoplasm of cervix: Secondary | ICD-10-CM

## 2012-12-24 DIAGNOSIS — Z0000 Encounter for general adult medical examination without abnormal findings: Secondary | ICD-10-CM

## 2012-12-24 DIAGNOSIS — M542 Cervicalgia: Secondary | ICD-10-CM

## 2012-12-24 DIAGNOSIS — R739 Hyperglycemia, unspecified: Secondary | ICD-10-CM

## 2012-12-24 DIAGNOSIS — Z01419 Encounter for gynecological examination (general) (routine) without abnormal findings: Secondary | ICD-10-CM

## 2012-12-24 LAB — POCT URINALYSIS DIPSTICK
Bilirubin, UA: NEGATIVE
Blood, UA: NEGATIVE
Glucose, UA: NEGATIVE
Ketones, UA: NEGATIVE
Leukocytes, UA: NEGATIVE
pH, UA: 5.5

## 2012-12-24 LAB — IFOBT (OCCULT BLOOD): IFOBT: NEGATIVE

## 2012-12-24 MED ORDER — HYDROCODONE-ACETAMINOPHEN 5-325 MG PO TABS: ORAL_TABLET | ORAL | Status: DC

## 2012-12-24 NOTE — Progress Notes (Signed)
Subjective:    Patient ID: Emily Lopez, female    DOB: Nov 17, 1955, 57 y.o.   MRN: [id number]  HPI This 57 y.o. AA female is here to establish primary care; previous care at [facility]. She is married to retired Hotel manager and has [employer] Pt has HTN and a murmur  evaluated by Dr. Marca Ancona (April 2013 -2D ECHO revealed good LV function w/o LVH, mild  MR but no major abnormalities). Pt recently seen at his office in April 2014; ECG normal.  She has depression w/ anxiety and chronic myalgias/ neck and back pain.   HCM: MMG- 2010 (normal at [facility]).            PAP- 2010 (negative).            CRS- pt reports done (not validated).            Vision care- not recently.            Dental care- not recently.            IMM- pt refused Tetanus and Influenza vaccines in the past.    PMHx, Soc Hx and Fam Hx reviewed. Problem List and Medications reviewed.   Review of Systems  Constitutional: Positive for activity change and fatigue. Negative for diaphoresis, appetite change and unexpected weight change.  HENT: Negative.   Eyes: Negative.   Respiratory: Negative for apnea, cough, chest tightness, shortness of breath and wheezing.   Cardiovascular: Negative.   Gastrointestinal: Negative.   Endocrine: Negative.   Genitourinary: Negative.   Musculoskeletal: Positive for myalgias and back pain. Negative for arthralgias and gait problem.       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  Allergic/Immunologic: Negative.   Neurological: Negative.   Hematological: Negative.   Psychiatric/Behavioral: Positive for sleep disturbance and dysphoric mood. Negative for suicidal ideas. The patient is nervous/anxious.        Pt  Reports chronic depression and anxiety; also reports daytime sleepiness ("I can fall sleep anywhere if I am sitting still long enough).       Objective:   Physical Exam  Nursing note and vitals reviewed. Constitutional: She is oriented to person,  place, and time. She appears well-developed and well-nourished. No distress.  HENT:  Head: Normocephalic and atraumatic.  Right Ear: Hearing, external ear and ear canal normal.  Left Ear: Hearing, external ear and ear canal normal.  Nose: Nose normal. No nasal deformity or septal deviation.  Mouth/Throat: Uvula is midline and mucous membranes are normal. No oral lesions. Abnormal dentition.  Eyes: Conjunctivae, EOM and lids are normal. Pupils are equal, round, and reactive to light. No scleral icterus.  Fundoscopic exam:      The right eye shows no hemorrhage and no papilledema. The right eye shows red reflex.       The left eye shows no hemorrhage and no papilledema. The left eye shows red reflex.  Fundoscopic exam difficult; muddy sclerae.  Neck: Neck supple. No JVD present. Spinous process tenderness and muscular tenderness present. No rigidity. No mass and no thyromegaly present.  Cardiovascular: Normal rate, regular rhythm, S1 normal, S2 normal, intact distal pulses and normal pulses.   No extrasystoles are present. PMI is not displaced.  Exam reveals no gallop and no friction rub.   Murmur heard.  Systolic murmur is present with a grade of 2/6  Murmur best heard at [facility].  Pulmonary/Chest: Effort normal and breath sounds normal. No respiratory distress. She has no  wheezes. She exhibits no tenderness. Right breast exhibits no inverted nipple, no mass, no nipple discharge, no skin change and no tenderness. Left breast exhibits no inverted nipple, no mass, no nipple discharge, no skin change and no tenderness. Breasts are symmetrical.  Abdominal: Normal appearance. She exhibits distension. She exhibits no pulsatile midline mass and no mass. There is no hepatosplenomegaly. There is no tenderness. There is no guarding and no CVA tenderness. No hernia.  Genitourinary: Rectum normal. Rectal exam shows no external hemorrhoid, no fissure, no mass, no tenderness and anal tone normal. Guaiac negative  stool. There is no rash, tenderness or lesion on the right labia. There is no rash, tenderness or lesion on the left labia. Uterus is tender. Cervix exhibits discharge. Cervix exhibits no friability. Right adnexum displays tenderness. Right adnexum displays no mass and no fullness. Left adnexum displays tenderness. Left adnexum displays no mass and no fullness. No erythema, tenderness or bleeding around the vagina. Vaginal discharge found.  Examination of uterus difficult due to abdominal wall adipose tissue and guarding.  Musculoskeletal: She exhibits tenderness. She exhibits no edema.       Cervical back: She exhibits tenderness, pain and spasm.       Thoracic back: She exhibits tenderness, swelling, pain and spasm.  All major joints w/o deformities or effusions.  Lymphadenopathy:    She has no cervical adenopathy.       Right: No inguinal adenopathy present.       Left: No inguinal adenopathy present.  Neurological: She is alert and oriented to person, place, and time. She has normal reflexes. No cranial nerve deficit. She exhibits normal muscle tone. Coordination normal.  Skin: Skin is warm, dry and intact. No bruising and no rash noted. She is not diaphoretic. No cyanosis or erythema. Nails show no clubbing.  Psychiatric: Her speech is normal and behavior is normal. Judgment and thought content normal. Her affect is not angry and not labile. Cognition and memory are normal. She exhibits a depressed mood.    Results for orders placed in visit on 12/24/12  POCT URINALYSIS DIPSTICK      Result Value Range   Color, UA yellow     Clarity, UA clear     Glucose, UA neg     Bilirubin, UA neg     Ketones, UA neg     Spec Grav, UA 1.020     Blood, UA neg     pH, UA 5.5     Protein, UA neg     Urobilinogen, UA 0.2     Nitrite, UA neg     Leukocytes, UA Negative    IFOBT (OCCULT BLOOD)      Result Value Range   IFOBT Negative          Assessment & Plan:  Routine general medical  examination at a health care facility - Pt will contact [facility] to request 90-day supply of all chronic medications   Plan: POCT urinalysis dipstick, IFOBT POC (occult bld, rslt in office), Vitamin D 25 hydroxy  Encounter for cervical Pap smear with pelvic exam - Plan: Pap IG w/ reflex to HPV when ASC-U  Hyperlipidemia LDL goal < 100 - Plan: Lipid panel  Neck pain- Pt has been referred to Chiropractor for treatment.  HTN (hypertension) - Stable and controlled on current medications.       Plan: Basic metabolic panel, Lipid panel  Hyperglycemia    Plan: A1c  Other screening mammogram - Plan: MM Digital Screening

## 2012-12-24 NOTE — Patient Instructions (Signed)
Keeping You Healthy  Get These Tests  Blood Pressure- Have your blood pressure checked by your healthcare provider at least once a year.  Normal blood pressure is 120/80.  Weight- Have your body mass index (BMI) calculated to screen for obesity.  BMI is a measure of body fat based on height and weight.  You can calculate your own BMI at https://www.west-esparza.com/  Cholesterol- Have your cholesterol checked every year.  Diabetes- Have your blood sugar checked every year if you have high blood pressure, high cholesterol, a family history of diabetes or if you are overweight.  Pap Smear- Have a pap smear every 1 to 3 years if you have been sexually active.  If you are older than 65 and recent pap smears have been normal you may not need additional pap smears.  In addition, if you have had a hysterectomy  For benign disease additional pap smears are not necessary.  Mammogram-Yearly mammograms are essential for early detection of breast cancer  Screening for Colon Cancer- Colonoscopy starting at age 30. Screening may begin sooner depending on your family history and other health conditions.  Follow up colonoscopy as directed by your Gastroenterologist.  Screening for Osteoporosis- Screening begins at age 5 with bone density scanning, sooner if you are at higher risk for developing Osteoporosis.  Get these medicines  Calcium with Vitamin D- Your body requires 1200-1500 mg of Calcium a day and [id number] IU of Vitamin D a day.  You can only absorb 500 mg of Calcium at a time therefore Calcium must be taken in 2 or 3 separate doses throughout the day.  Hormones- Hormone therapy has been associated with increased risk for certain cancers and heart disease.  Talk to your healthcare provider about if you need relief from menopausal symptoms.  Aspirin- Ask your healthcare provider about taking Aspirin to prevent Heart Disease and Stroke.  Get these Immuniztions  Flu shot- Every fall- This is advised  every fall /winter.  Pneumonia shot- Once after the age of 22; if you are younger ask your healthcare provider if you need a pneumonia shot.  Tetanus- Every ten years. You need a tdap; this is a one-time vaccine.  Zostavax- Once after the age of 20 to prevent shingles.  Take these steps  Don't smoke- Your healthcare provider can help you quit. For tips on how to quit, ask your healthcare provider or go to www.smokefree.gov or call 1-800 QUIT-NOW.  Be physically active- Exercise 5 days a week for a minimum of 30 minutes.  If you are not already physically active, start slow and gradually work up to 30 minutes of moderate physical activity.  Try walking, dancing, bike riding, swimming, etc.  Eat a healthy diet- Eat a variety of healthy foods such as fruits, vegetables, whole grains, low fat milk, low fat cheeses, yogurt, lean meats, chicken, fish, eggs, dried beans, tofu, etc.  For more information go to www.thenutritionsource.org  Dental visit- Brush and floss teeth twice daily; visit your dentist twice a year.  Eye exam- Visit your Optometrist or Ophthalmologist yearly.  Drink alcohol in moderation- Limit alcohol intake to one drink or less a day.  Never drink and drive.  Depression- Your emotional health is as important as your physical health.  If you're feeling down or losing interest in things you normally enjoy, please talk to your healthcare provider.  Seat Belts- can save your life; always wear one  Smoke/Carbon Monoxide detectors- These detectors need to be installed on the appropriate level  of your home.  Replace batteries at least once a year.  Violence- If anyone is threatening or hurting you, please tell your healthcare provider.  Living Will/ Health care power of attorney- Discuss with your healthcare provider and family.

## 2012-12-25 LAB — PAP IG W/ RFLX HPV ASCU

## 2012-12-25 LAB — LIPID PANEL
Cholesterol: 195 mg/dL (ref 0–200)
Triglycerides: 69 mg/dL (ref <150)
VLDL: 14 mg/dL (ref 0–40)

## 2012-12-25 LAB — VITAMIN D 25 HYDROXY (VIT D DEFICIENCY, FRACTURES): Vit D, 25-Hydroxy: 36 ng/mL (ref 30–89)

## 2012-12-25 LAB — BASIC METABOLIC PANEL
Chloride: 104 mEq/L (ref 96–112)
Creat: 0.89 mg/dL (ref 0.50–1.10)
Potassium: 4.1 mEq/L (ref 3.5–5.3)

## 2012-12-29 NOTE — Progress Notes (Signed)
Quick Note:  Please notify pt that results are normal.   Provide pt with copy of labs. ______ 

## 2012-12-30 ENCOUNTER — Other Ambulatory Visit: Payer: Self-pay

## 2012-12-30 DIAGNOSIS — M542 Cervicalgia: Secondary | ICD-10-CM

## 2012-12-30 MED ORDER — LISINOPRIL 20 MG PO TABS: 20.0000 mg | ORAL_TABLET | Freq: Every day | ORAL | Status: DC

## 2012-12-30 MED ORDER — AMLODIPINE BESYLATE 10 MG PO TABS: 10.0000 mg | ORAL_TABLET | Freq: Every day | ORAL | Status: DC

## 2012-12-30 MED ORDER — GABAPENTIN 100 MG PO CAPS: ORAL_CAPSULE | ORAL | Status: DC

## 2012-12-30 MED ORDER — FLUOXETINE HCL 10 MG PO TABS: 10.0000 mg | ORAL_TABLET | Freq: Two times a day (BID) | ORAL | Status: DC

## 2012-12-30 MED ORDER — MELOXICAM 7.5 MG PO TABS: 7.5000 mg | ORAL_TABLET | Freq: Every day | ORAL | Status: DC

## 2013-01-08 ENCOUNTER — Ambulatory Visit (HOSPITAL_COMMUNITY)
Admission: RE | Admit: 2013-01-08 | Discharge: 2013-01-08 | Disposition: A | Discharge status: Home / Self Care | Source: Ambulatory Visit | Attending: Family Medicine | Admitting: Family Medicine

## 2013-01-08 DIAGNOSIS — Z1231 Encounter for screening mammogram for malignant neoplasm of breast: Secondary | ICD-10-CM | POA: Insufficient documentation

## 2013-01-11 ENCOUNTER — Other Ambulatory Visit: Payer: Self-pay | Admitting: Family Medicine

## 2013-01-11 DIAGNOSIS — R928 Other abnormal and inconclusive findings on diagnostic imaging of breast: Secondary | ICD-10-CM

## 2013-01-27 ENCOUNTER — Other Ambulatory Visit

## 2013-01-27 ENCOUNTER — Encounter

## 2013-02-04 ENCOUNTER — Ambulatory Visit
Admission: RE | Admit: 2013-02-04 | Discharge: 2013-02-04 | Disposition: A | Discharge status: Home / Self Care | Source: Ambulatory Visit | Attending: Family Medicine | Admitting: Family Medicine

## 2013-02-04 DIAGNOSIS — R928 Other abnormal and inconclusive findings on diagnostic imaging of breast: Secondary | ICD-10-CM

## 2013-04-07 ENCOUNTER — Ambulatory Visit: Admitting: Family Medicine

## 2013-06-08 ENCOUNTER — Telehealth: Payer: Self-pay

## 2013-06-08 NOTE — Telephone Encounter (Signed)
Pt states that the hydrocodone is not helping for her back and shoulder pain, she has been seen at [facility] and they informed her that she would need to contact us to get something else prescribed for pain. Best# [phone]

## 2013-06-09 MED ORDER — HYDROCODONE-ACETAMINOPHEN 5-325 MG PO TABS: ORAL_TABLET | ORAL | Status: DC

## 2013-06-09 NOTE — Telephone Encounter (Signed)
Hydrocodone- APAP 5-325 mg is prescribed 1/2 tablet every 6 hours prn pain. I will refill this med and she should try taking 1 tablet every 6-8 hours for pain. RX can be picked up at [facility].

## 2013-06-10 NOTE — Telephone Encounter (Signed)
Pt advised this is ready for pick up. 

## 2013-06-28 ENCOUNTER — Other Ambulatory Visit: Payer: Self-pay | Admitting: Family Medicine

## 2013-10-06 ENCOUNTER — Ambulatory Visit: Admitting: Cardiology

## 2013-11-02 ENCOUNTER — Emergency Department (HOSPITAL_COMMUNITY)
Admission: EM | Admit: 2013-11-02 | Discharge: 2013-11-02 | Disposition: A | Discharge status: Home / Self Care | Attending: Emergency Medicine | Admitting: Emergency Medicine

## 2013-11-02 ENCOUNTER — Encounter (HOSPITAL_COMMUNITY): Payer: Self-pay | Admitting: Emergency Medicine

## 2013-11-02 DIAGNOSIS — M129 Arthropathy, unspecified: Secondary | ICD-10-CM | POA: Insufficient documentation

## 2013-11-02 DIAGNOSIS — R011 Cardiac murmur, unspecified: Secondary | ICD-10-CM | POA: Insufficient documentation

## 2013-11-02 DIAGNOSIS — Z862 Personal history of diseases of the blood and blood-forming organs and certain disorders involving the immune mechanism: Secondary | ICD-10-CM | POA: Insufficient documentation

## 2013-11-02 DIAGNOSIS — W108XXA Fall (on) (from) other stairs and steps, initial encounter: Secondary | ICD-10-CM | POA: Insufficient documentation

## 2013-11-02 DIAGNOSIS — S20213A Contusion of bilateral front wall of thorax, initial encounter: Secondary | ICD-10-CM

## 2013-11-02 DIAGNOSIS — Y929 Unspecified place or not applicable: Secondary | ICD-10-CM | POA: Insufficient documentation

## 2013-11-02 DIAGNOSIS — Z8701 Personal history of pneumonia (recurrent): Secondary | ICD-10-CM | POA: Insufficient documentation

## 2013-11-02 DIAGNOSIS — F3289 Other specified depressive episodes: Secondary | ICD-10-CM | POA: Insufficient documentation

## 2013-11-02 DIAGNOSIS — Y939 Activity, unspecified: Secondary | ICD-10-CM | POA: Insufficient documentation

## 2013-11-02 DIAGNOSIS — Z87891 Personal history of nicotine dependence: Secondary | ICD-10-CM | POA: Insufficient documentation

## 2013-11-02 DIAGNOSIS — I10 Essential (primary) hypertension: Secondary | ICD-10-CM | POA: Insufficient documentation

## 2013-11-02 DIAGNOSIS — Z7982 Long term (current) use of aspirin: Secondary | ICD-10-CM | POA: Insufficient documentation

## 2013-11-02 DIAGNOSIS — Z79899 Other long term (current) drug therapy: Secondary | ICD-10-CM | POA: Insufficient documentation

## 2013-11-02 DIAGNOSIS — F411 Generalized anxiety disorder: Secondary | ICD-10-CM | POA: Insufficient documentation

## 2013-11-02 DIAGNOSIS — IMO0002 Reserved for concepts with insufficient information to code with codable children: Secondary | ICD-10-CM | POA: Insufficient documentation

## 2013-11-02 DIAGNOSIS — F329 Major depressive disorder, single episode, unspecified: Secondary | ICD-10-CM | POA: Insufficient documentation

## 2013-11-02 DIAGNOSIS — Z791 Long term (current) use of non-steroidal anti-inflammatories (NSAID): Secondary | ICD-10-CM | POA: Insufficient documentation

## 2013-11-02 DIAGNOSIS — S20219A Contusion of unspecified front wall of thorax, initial encounter: Secondary | ICD-10-CM | POA: Insufficient documentation

## 2013-11-02 MED ORDER — CYCLOBENZAPRINE HCL 5 MG PO TABS: 5.0000 mg | ORAL_TABLET | Freq: Three times a day (TID) | ORAL | Status: DC | PRN

## 2013-11-02 NOTE — Discharge Instructions (Signed)
Chest Contusion °A chest contusion is a deep bruise on your chest area. Contusions are the result of an injury that caused bleeding under the skin. A chest contusion may involve bruising of the skin, muscles, or ribs. The contusion may turn blue, purple, or yellow. Minor injuries will give you a painless contusion, but more severe contusions may stay painful and swollen for a few weeks. °CAUSES  °A contusion is usually caused by a blow, trauma, or direct force to an area of the body. °SYMPTOMS  °· Swelling and redness of the injured area. °· Discoloration of the injured area. °· Tenderness and soreness of the injured area. °· Pain. °DIAGNOSIS  °The diagnosis can be made by taking a history and performing a physical exam. An X-ray, CT scan, or MRI may be needed to determine if there were any associated injuries, such as broken bones (fractures) or internal injuries. °TREATMENT  °Often, the best treatment for a chest contusion is resting, icing, and applying cold compresses to the injured area. Deep breathing exercises may be recommended to reduce the risk of pneumonia. Over-the-counter medicines may also be recommended for pain control. °HOME CARE INSTRUCTIONS  °· Put ice on the injured area. °· Put ice in a plastic bag. °· Place a towel between your skin and the bag. °· Leave the ice on for 15-20 minutes, 03-04 times a day. °· Only take over-the-counter or prescription medicines as directed by your caregiver. Your caregiver may recommend avoiding anti-inflammatory medicines (aspirin, ibuprofen, and naproxen) for 48 hours because these medicines may increase bruising. °· Rest the injured area. °· Perform deep-breathing exercises as directed by your caregiver. °· Stop smoking if you smoke. °· Do not lift objects over 5 pounds (2.3 kg) for 3 days or longer if recommended by your caregiver. °SEEK IMMEDIATE MEDICAL CARE IF:  °· You have increased bruising or swelling. °· You have pain that is getting worse. °· You have  difficulty breathing. °· You have dizziness, weakness, or fainting. °· You have blood in your urine or stool. °· You cough up or vomit blood. °· Your swelling or pain is not relieved with medicines. °MAKE SURE YOU:  °· Understand these instructions. °· Will watch your condition. °· Will get help right away if you are not doing well or get worse. °Document Released: 02/19/2001 Document Revised: 02/19/2012 Document Reviewed: 11/18/2011 °[employer]® Patient Information ©2014 [employer]. ° °

## 2013-11-02 NOTE — ED Notes (Signed)
Pt alert, arrives from home, c/o low back pain, onset several days ago after fall, seen in another ED, treated, returns today with cont pain, resp even unlabored, skin pwd

## 2013-11-02 NOTE — ED Provider Notes (Signed)
CSN: [id number]     Arrival date & time 11/02/13  3382 History   First MD Initiated Contact with Patient 11/02/13 [phone]     Chief Complaint  Patient presents with  . Back Pain   HPI Pt fell down several steps on Friday.  Landed directly on her back.  The pain began immediately after the fall but was worse the next day.  She was seen in another emergency room Sunday morning in [place].  She had x-rays of her back at that time.  Pt was told that everything was ok on the xrays.  She was given ibuprofen and vicodin.  She continues to hurt and feels no better.  This am she needed assistance getting out of the bed so she came to be evaluated.   No numbness or weakness.      No trouble urinating.  No abdominal pain. The pain is primarily in her admitted back on both sides. Pain is sharp. When she moves certain ways will make her catch her breath. She denies any shortness of breath at rest. She has not been coughing up any blood.  Past Medical History  Diagnosis Date  . Hypertension   . Pneumonia     20 08 or 2009  . Depression   . Anxiety   . Arthritis   . Iron deficiency anemia, unspecified   . Attention deficit disorder without mention of hyperactivity   . Esophageal reflux   . Myalgia and myositis, unspecified   . Undiagnosed cardiac murmurs   . Unspecified vitamin D deficiency    History reviewed. No pertinent past surgical history. Family History  Problem Relation Age of Onset  . Hypertension Mother   . Diabetes Mother   . Arthritis Mother   . Hypertension Sister   . Lung cancer Father   . Heart attack Father   . Diabetes Brother    History  Substance Use Topics  . Smoking status: Never Smoker   . Smokeless tobacco: Former Systems developer    Types: Snuff    Quit date: 07/24/2004  . Alcohol Use: Yes     Comment: occasionally   OB History   Grav Para Term Preterm Abortions TAB SAB Ect Mult Living                 Review of Systems  All other systems reviewed and are  negative.     Allergies  Review of patient's allergies indicates no known allergies.  Home Medications   Prior to Admission medications   Medication Sig Start Date End Date Taking? Authorizing Provider  amLODipine (NORVASC) 10 MG tablet Take 1 tablet (10 mg total) by mouth daily. PATIENT NEEDS OFFICE VISIT FOR ADDITIONAL REFILLS 06/28/13   Barton Fanny, MD  aspirin EC 81 MG tablet Take 1 tablet (81 mg total) by mouth daily. 09/23/12   Larey Dresser, MD  FLUoxetine (PROZAC) 10 MG tablet Take 1 tablet (10 mg total) by mouth 2 (two) times daily. PATIENT NEEDS OFFICE VISIT FOR ADDITIONAL REFILLS 06/28/13   Barton Fanny, MD  fluticasone [facility]) 50 MCG/ACT nasal spray Place 2 sprays into the nose daily. 08/18/12   Thao P Le, DO  gabapentin (NEURONTIN) 100 MG capsule Take 2 capsules at night as needed for neck pain if tolerated can increase to 2 capsules in the morning 2 capsules at night 12/30/12   Barton Fanny, MD  HYDROcodone-acetaminophen (NORCO) 5-325 MG per tablet Take 1 tablet every 6 hours as needed for pain.  06/09/13   Barton Fanny, MD  lisinopril (PRINIVIL,ZESTRIL) 20 MG tablet Take 1 tablet (20 mg total) by mouth daily. PATIENT NEEDS OFFICE VISIT FOR ADDITIONAL REFILLS 06/28/13   Barton Fanny, MD  meloxicam (MOBIC) 7.5 MG tablet Take 1 tablet (7.5 mg total) by mouth daily. 12/30/12   Barton Fanny, MD  Multiple Vitamin (MULTIVITAMIN WITH MINERALS) TABS Take 1 tablet by mouth daily.    Historical Provider, MD   BP 176/78  Pulse 70  Temp(Src) 98 F (36.7 C) (Oral)  Resp 16  Wt 171 lb (77.565 kg)  SpO2 96% Physical Exam  Nursing note and vitals reviewed. Constitutional: She appears well-developed and well-nourished. No distress.  HENT:  Head: Normocephalic and atraumatic.  Right Ear: External ear normal.  Left Ear: External ear normal.  Eyes: Conjunctivae are normal. Right eye exhibits no discharge. Left eye exhibits no discharge. No scleral  icterus.  Neck: Neck supple. No tracheal deviation present.  Cardiovascular: Normal rate, regular rhythm and intact distal pulses.   Pulmonary/Chest: Effort normal and breath sounds normal. No accessory muscle usage or stridor. No respiratory distress. She has no decreased breath sounds. She has no wheezes. She has no rales. She exhibits tenderness and bony tenderness. She exhibits no laceration and no crepitus.  Tenderness to bilateral midthoracic ribs posteriorly  Abdominal: Soft. Bowel sounds are normal. She exhibits no distension. There is no tenderness. There is no rebound and no guarding.  Musculoskeletal: She exhibits no edema and no tenderness.  Neurological: She is alert. She has normal strength. No cranial nerve deficit (no facial droop, extraocular movements intact, no slurred speech) or sensory deficit. She exhibits normal muscle tone. She displays no seizure activity. Coordination normal.  Skin: Skin is warm and dry. No rash noted.  Psychiatric: She has a normal mood and affect.    ED Course  Procedures (including critical care time)  MDM   Final diagnoses:  Rib contusion  Bilateral contusion of ribs    I am unable to review the x-rays that the patient had at the other facility but she reports that they were normal. I offered to repeat x-rays today of her rib area. Patient declined. I explained to the patient that her is located in the rib area. Possible she could have an occult rib fracture I doubt more serious injury such as pneumothorax pulmonary contusion. I discussed the treatment for even rib fracture would be pain management. The patient was given a prescription for ibuprofen and Vicodin. She has not been taking the Vicodin because she did not want to take strong pain medications. Patient was concerned she still had pain after her injury on Friday. I reassured the patient this is not out of the ordinary to still be having pain.  She did not want any pain medications here in  the emergency department. I discussed continued outpatient treatment and followup with her doctor if symptoms are not improving.    Dorie Rank, MD 11/02/13 1021

## 2013-11-04 ENCOUNTER — Other Ambulatory Visit: Payer: Self-pay | Admitting: Emergency Medicine

## 2013-11-04 ENCOUNTER — Ambulatory Visit

## 2013-11-04 ENCOUNTER — Ambulatory Visit (INDEPENDENT_AMBULATORY_CARE_PROVIDER_SITE_OTHER): Admitting: Emergency Medicine

## 2013-11-04 VITALS — BP 136/80 | HR 70 | Temp 98.2°F | Resp 16 | Ht 67.0 in | Wt 188.0 lb

## 2013-11-04 DIAGNOSIS — M549 Dorsalgia, unspecified: Secondary | ICD-10-CM

## 2013-11-04 DIAGNOSIS — M5412 Radiculopathy, cervical region: Secondary | ICD-10-CM

## 2013-11-04 DIAGNOSIS — S22000A Wedge compression fracture of unspecified thoracic vertebra, initial encounter for closed fracture: Secondary | ICD-10-CM

## 2013-11-04 MED ORDER — CYCLOBENZAPRINE HCL 5 MG PO TABS: 5.0000 mg | ORAL_TABLET | Freq: Three times a day (TID) | ORAL | Status: DC | PRN

## 2013-11-04 MED ORDER — NAPROXEN SODIUM 550 MG PO TABS: 550.0000 mg | ORAL_TABLET | Freq: Two times a day (BID) | ORAL | Status: DC

## 2013-11-04 MED ORDER — HYDROCODONE-ACETAMINOPHEN 5-325 MG PO TABS: 1.0000 | ORAL_TABLET | ORAL | Status: DC | PRN

## 2013-11-04 NOTE — Progress Notes (Addendum)
[facility] [address] [place] [address] [phone]  Date:  11/04/2013   Name:  Emily Lopez   DOB:  December 16, 1955   MRN:  [id number]  PCP:  Kennon Portela, MD    Chief Complaint: Back Pain   History of Present Illness:  Emily Lopez is a 58 y.o. very pleasant female patient who presents with the following:  Injured Saturday when she slipped on the stairway. She was seen in the ER at [facility] for the injury and discharged with reportedly negative xrays per the patient. She was discharged on motrin, flexeril and vicodin. She claims pain in upper back between her shoulders.  Has tingiling in fingertips of right third and fourth fingers since.  No other neuro symptoms.  Says her low back pain is resolved.  No improvement with over the counter medications or other home remedies. Denies other complaint or health concern today.    Patient Active Problem List   Diagnosis Date Noted  . WRIST PAIN, LEFT 01/19/2010  . LEG PAIN, BILATERAL 01/19/2010  . MUSCLE PAIN 12/25/2009  . VITAMIN D DEFICIENCY 03/13/2009  . DEPRESSIVE DISORDER 08/24/2008  . OVERWEIGHT 07/25/2008  . ATTENTION DEFICIT DISORDER, ADULT 07/25/2008  . SYSTOLIC MURMUR 66/11/3014  . GERD 07/20/2007  . ANEMIA, IRON DEFICIENCY, UNSPEC. 08/07/2006  . HYPERTENSION, BENIGN SYSTEMIC 08/07/2006    Past Medical History  Diagnosis Date  . Hypertension   . Pneumonia     2008 or 2009  . Depression   . Anxiety   . Arthritis   . Iron deficiency anemia, unspecified   . Attention deficit disorder without mention of hyperactivity   . Esophageal reflux   . Myalgia and myositis, unspecified   . Undiagnosed cardiac murmurs   . Unspecified vitamin D deficiency     No past surgical history on file.  History  Substance Use Topics  . Smoking status: Never Smoker   . Smokeless tobacco: Former Systems developer    Types: Snuff    Quit date: 07/24/2004  . Alcohol Use: Yes     Comment: occasionally     Family History  Problem Relation Age of Onset  . Hypertension Mother   . Diabetes Mother   . Arthritis Mother   . Hypertension Sister   . Lung cancer Father   . Heart attack Father   . Diabetes Brother     Allergies  Allergen Reactions  . No Known Allergies     Medication list has been reviewed and updated.  Current Outpatient Prescriptions on File Prior to Visit  Medication Sig Dispense Refill  . amLODipine (NORVASC) 10 MG tablet Take 1 tablet (10 mg total) by mouth daily. PATIENT NEEDS OFFICE VISIT FOR ADDITIONAL REFILLS  30 tablet  0  . cyclobenzaprine (FLEXERIL) 5 MG tablet Take 1 tablet (5 mg total) by mouth 3 (three) times daily as needed for muscle spasms.  21 tablet  0  . lisinopril (PRINIVIL,ZESTRIL) 20 MG tablet Take 1 tablet (20 mg total) by mouth daily. PATIENT NEEDS OFFICE VISIT FOR ADDITIONAL REFILLS  30 tablet  0  . Multiple Vitamin (MULTIVITAMIN WITH MINERALS) TABS Take 1 tablet by mouth daily.       No current facility-administered medications on file prior to visit.    Review of Systems:  As per HPI, otherwise negative.   Physical Examination: Filed Vitals:   11/04/13 1338  BP: 136/80  Pulse: 70  Temp: 98.2 F (36.8 C)  Resp: 16  Filed Vitals:   11/04/13 1338  Height: 5\' 7"  (1.702 m)  Weight: 188 lb (85.276 kg)   Body mass index is 29.44 kg/(m^2). Ideal Body Weight: Weight in (lb) to have BMI = 25: 159.3  GEN: WDWN, NAD, Non-toxic, A & O x 3 HEENT: Atraumatic, Normocephalic. Neck supple. No masses, No LAD. Ears and Nose: No external deformity. CV: RRR, No M/G/R. No JVD. No thrill. No extra heart sounds. PULM: CTA B, no wheezes, crackles, rhonchi. No retractions. No resp. distress. No accessory muscle use. ABD: S, NT, ND, +BS. No rebound. No HSM. EXTR: No c/c/e NEURO Normal gait.  PSYCH: Normally interactive. Conversant. Not depressed or anxious appearing.  Calm demeanor.  Back:  Tender thoracic spine between scapula  Assessment and  Plan: Cervical neuritis Back in a week if not improved  Signed,  Ellison Carwin, MD   [facility] reading (PRIMARY) by  Dr. Eugenio Hoes negative.  [facility] reading (PRIMARY) by  Dr. Ouida Sills  C spine DJD .

## 2013-11-04 NOTE — Patient Instructions (Signed)
Cervical Radiculopathy  Cervical radiculopathy happens when a nerve in the neck is pinched or bruised by a slipped (herniated) disk or by arthritic changes in the bones of the cervical spine. This can occur due to an injury or as part of the normal aging process. Pressure on the cervical nerves can cause pain or numbness that runs from your neck all the way down into your arm and fingers.  CAUSES   There are many possible causes, including:  · Injury.  · Muscle tightness in the neck from overuse.  · Swollen, painful joints (arthritis).  · Breakdown or degeneration in the bones and joints of the spine (spondylosis) due to aging.  · Bone spurs that may develop near the cervical nerves.  SYMPTOMS   Symptoms include pain, weakness, or numbness in the affected arm and hand. Pain can be severe or irritating. Symptoms may be worse when extending or turning the neck.  DIAGNOSIS   Your caregiver will ask about your symptoms and do a physical exam. He or she may test your strength and reflexes. X-rays, CT scans, and MRI scans may be needed in cases of injury or if the symptoms do not go away after a period of time. Electromyography (EMG) or nerve conduction testing may be done to study how your nerves and muscles are working.  TREATMENT   Your caregiver may recommend certain exercises to help relieve your symptoms. Cervical radiculopathy can, and often does, get better with time and treatment. If your problems continue, treatment options may include:  · Wearing a soft collar for short periods of time.  · Physical therapy to strengthen the neck muscles.  · Medicines, such as nonsteroidal anti-inflammatory drugs (NSAIDs), oral corticosteroids, or spinal injections.  · Surgery. Different types of surgery may be done depending on the cause of your problems.  HOME CARE INSTRUCTIONS   · Put ice on the affected area.  · Put ice in a plastic bag.  · Place a towel between your skin and the bag.  · Leave the ice on for 15-20 minutes,  03-04 times a day or as directed by your caregiver.  · If ice does not help, you can try using heat. Take a warm shower or bath, or use a hot water bottle as directed by your caregiver.  · You may try a gentle neck and shoulder massage.  · Use a flat pillow when you sleep.  · Only take over-the-counter or prescription medicines for pain, discomfort, or fever as directed by your caregiver.  · If physical therapy was prescribed, follow your caregiver's directions.  · If a soft collar was prescribed, use it as directed.  SEEK IMMEDIATE MEDICAL CARE IF:   · Your pain gets much worse and cannot be controlled with medicines.  · You have weakness or numbness in your hand, arm, face, or leg.  · You have a high fever or a stiff, rigid neck.  · You lose bowel or bladder control (incontinence).  · You have trouble with walking, balance, or speaking.  MAKE SURE YOU:   · Understand these instructions.  · Will watch your condition.  · Will get help right away if you are not doing well or get worse.  Document Released: 02/19/2001 Document Revised: 08/19/2011 Document Reviewed: 01/08/2011  [employer]® Patient Information ©2014 [employer].

## 2013-12-03 ENCOUNTER — Ambulatory Visit: Admitting: Family Medicine

## 2013-12-03 ENCOUNTER — Ambulatory Visit (INDEPENDENT_AMBULATORY_CARE_PROVIDER_SITE_OTHER): Admitting: Family Medicine

## 2013-12-03 VITALS — BP 130/76 | HR 69 | Temp 98.7°F | Resp 18 | Ht 67.0 in | Wt 187.4 lb

## 2013-12-03 DIAGNOSIS — Q619 Cystic kidney disease, unspecified: Secondary | ICD-10-CM

## 2013-12-03 DIAGNOSIS — M546 Pain in thoracic spine: Secondary | ICD-10-CM

## 2013-12-03 DIAGNOSIS — D259 Leiomyoma of uterus, unspecified: Secondary | ICD-10-CM

## 2013-12-03 DIAGNOSIS — N281 Cyst of kidney, acquired: Secondary | ICD-10-CM

## 2013-12-03 DIAGNOSIS — I10 Essential (primary) hypertension: Secondary | ICD-10-CM

## 2013-12-03 DIAGNOSIS — Z862 Personal history of diseases of the blood and blood-forming organs and certain disorders involving the immune mechanism: Secondary | ICD-10-CM

## 2013-12-03 DIAGNOSIS — D509 Iron deficiency anemia, unspecified: Secondary | ICD-10-CM

## 2013-12-03 DIAGNOSIS — Z8639 Personal history of other endocrine, nutritional and metabolic disease: Secondary | ICD-10-CM

## 2013-12-03 LAB — CBC
HCT: 35.1 % — ABNORMAL LOW (ref 36.0–46.0)
HEMOGLOBIN: 12 g/dL (ref 12.0–15.0)
MCH: 26.4 pg (ref 26.0–34.0)
MCHC: 34.2 g/dL (ref 30.0–36.0)
MCV: 77.3 fL — ABNORMAL LOW (ref 78.0–100.0)
Platelets: 313 10*3/uL (ref 150–400)
RBC: 4.54 MIL/uL (ref 3.87–5.11)
RDW: 16.8 % — ABNORMAL HIGH (ref 11.5–15.5)
WBC: 5.6 10*3/uL (ref 4.0–10.5)

## 2013-12-03 LAB — COMPREHENSIVE METABOLIC PANEL
ALBUMIN: 4.5 g/dL (ref 3.5–5.2)
ALK PHOS: 75 U/L (ref 39–117)
ALT: 12 U/L (ref 0–35)
AST: 17 U/L (ref 0–37)
BUN: 13 mg/dL (ref 6–23)
CO2: 29 mEq/L (ref 19–32)
Calcium: 9.7 mg/dL (ref 8.4–10.5)
Chloride: 102 mEq/L (ref 96–112)
Creat: 0.92 mg/dL (ref 0.50–1.10)
GLUCOSE: 119 mg/dL — AB (ref 70–99)
POTASSIUM: 4.2 meq/L (ref 3.5–5.3)
Sodium: 141 mEq/L (ref 135–145)
TOTAL PROTEIN: 7.9 g/dL (ref 6.0–8.3)
Total Bilirubin: 0.9 mg/dL (ref 0.2–1.2)

## 2013-12-03 LAB — LIPID PANEL
Cholesterol: 189 mg/dL (ref 0–200)
HDL: 77 mg/dL (ref >39)
LDL CALC: 94 mg/dL (ref 0–99)
TRIGLYCERIDES: 88 mg/dL (ref <150)
Total CHOL/HDL Ratio: 2.5 Ratio
VLDL: 18 mg/dL (ref 0–40)

## 2013-12-03 NOTE — Patient Instructions (Signed)
Stretches of the heel as discussed  An ultrasound of your pelvis and abdomen will be scheduled to evaluate the cyst on the kidneys and the fibroids in the uterus.  Labs are pending, and results will be sent or called to you in the next week or so.  Followup with Dr. Leward Quan in July

## 2013-12-03 NOTE — Progress Notes (Signed)
Subjective: 58 year old lady who fell down some steps month ago and had a fracture in her back. She has seen orthopedist for that and wears a brace. She is doing well without a lot of pain, does take regular medications for it. She was supposed to see Dr. Leward Quan back in the other office today, but she missed her appointment. She was told she could come over here and get checked and get her labs done. Apparently a week ago she turned in the MRI report toDr. McPherson's office, but I don't have that report here. She is on antihypertensive medications and her blood pressure is in good control. She has had a little elevation of cholesterol in the past. She is fasting today for labs.  Incidentally the patient complained of her right heel hurting her. No known trauma. It hurts less when she wears high heels.  Objective: Pleasant lady in no major distress. TMs normal. Throat clear. Neck supple without nodes. Chest is clear to auscultation. Heart regular without murmurs. Abdomen has the back brace in the way, but is soft without masses. Extremities are normal. She is able to move around adequately despite the back brace.  Tender at the attachment of the Achilles tendon into the heel.  Assessment: Back fracture, recent, with back pain Hypertension controlled Hx of anemia Heel pain

## 2013-12-04 ENCOUNTER — Encounter: Payer: Self-pay | Admitting: *Deleted

## 2013-12-06 ENCOUNTER — Other Ambulatory Visit: Payer: Self-pay | Admitting: Family Medicine

## 2013-12-06 DIAGNOSIS — D259 Leiomyoma of uterus, unspecified: Secondary | ICD-10-CM

## 2013-12-17 ENCOUNTER — Ambulatory Visit
Admission: RE | Admit: 2013-12-17 | Discharge: 2013-12-17 | Disposition: A | Discharge status: Home / Self Care | Source: Ambulatory Visit | Attending: Family Medicine | Admitting: Family Medicine

## 2013-12-17 ENCOUNTER — Encounter (INDEPENDENT_AMBULATORY_CARE_PROVIDER_SITE_OTHER): Payer: Self-pay

## 2013-12-17 DIAGNOSIS — D259 Leiomyoma of uterus, unspecified: Secondary | ICD-10-CM

## 2013-12-17 DIAGNOSIS — N281 Cyst of kidney, acquired: Secondary | ICD-10-CM

## 2013-12-24 ENCOUNTER — Encounter: Payer: Self-pay | Admitting: Family Medicine

## 2013-12-24 ENCOUNTER — Ambulatory Visit (INDEPENDENT_AMBULATORY_CARE_PROVIDER_SITE_OTHER): Admitting: Family Medicine

## 2013-12-24 VITALS — BP 164/98 | HR 89 | Temp 99.0°F | Resp 16 | Ht 66.0 in | Wt 183.0 lb

## 2013-12-24 DIAGNOSIS — I10 Essential (primary) hypertension: Secondary | ICD-10-CM

## 2013-12-24 DIAGNOSIS — Z23 Encounter for immunization: Secondary | ICD-10-CM

## 2013-12-24 NOTE — Patient Instructions (Signed)
Tetanus, Diphtheria, Pertussis (Tdap) Vaccine What You Need to Know WHY GET VACCINATED? Tetanus, diphtheria and pertussis can be very serious diseases, even for adolescents and adults. Tdap vaccine can protect [place] from these diseases. TETANUS (Lockjaw) causes painful muscle tightening and stiffness, usually all over the body. It can lead to tightening of muscles in the head and neck so you can't open your mouth, swallow, or sometimes even breathe. Tetanus kills about 1 out of 5 people who are infected. DIPHTHERIA can cause a thick coating to form in the back of the throat. It can lead to breathing problems, paralysis, heart failure, and death. PERTUSSIS (Whooping Cough) causes severe coughing spells, which can cause difficulty breathing, vomiting and disturbed sleep. It can also lead to weight loss, incontinence, and rib fractures. Up to 2 in 100 adolescents and 5 in 100 adults with pertussis are hospitalized or have complications, which could include pneumonia and death. These diseases are caused by bacteria. Diphtheria and pertussis are spread from person to person through coughing or sneezing. Tetanus enters the body through cuts, scratches, or wounds. Before vaccines, the [place] saw as many as 200,000 cases a year of diphtheria and pertussis, and hundreds of cases of tetanus. Since vaccination began, tetanus and diphtheria have dropped by about 99% and pertussis by about 80%. TDAP VACCINE Tdap vaccine can protect adolescents and adults from tetanus, diphtheria, and pertussis. One dose of Tdap is routinely given at age 66 or 22. People who did not get Tdap at that age should get it as soon as possible. Tdap is especially important for health care professionals and anyone having close contact with a baby younger than 12 months. Pregnant women should get a dose of Tdap during every pregnancy, to protect the newborn from pertussis. Infants are most at risk for severe, life-threatening  complications from pertussis. A similar vaccine, called Td, protects from tetanus and diphtheria, but not pertussis. A Td booster should be given every 10 years. Tdap may be given as one of these boosters if you have not already gotten a dose. Tdap may also be given after a severe cut or burn to prevent tetanus infection. Your doctor can give you more information. Tdap may safely be given at the same time as other vaccines. SOME PEOPLE SHOULD NOT GET THIS VACCINE If you ever had a life-threatening allergic reaction after a dose of any tetanus, diphtheria, or pertussis containing vaccine, OR if you have a severe allergy to any part of this vaccine, you should not get Tdap. Tell your doctor if you have any severe allergies. If you had a coma, or long or multiple seizures within 7 days after a childhood dose of DTP or DTaP, you should not get Tdap, unless a cause other than the vaccine was found. You can still get Td. Talk to your doctor if you: have epilepsy or another nervous system problem, had severe pain or swelling after any vaccine containing diphtheria, tetanus or pertussis, ever had Guillain-Barr Syndrome (GBS), aren't feeling well on the day the shot is scheduled. RISKS OF A VACCINE REACTION With any medicine, including vaccines, there is a chance of side effects. These are usually mild and go away on their own, but serious reactions are also possible. Brief fainting spells can follow a vaccination, leading to injuries from falling. Sitting or lying down for about 15 minutes can help prevent these. Tell your doctor if you feel dizzy or light-headed, or have vision changes or ringing in the ears. Mild problems  following Tdap (Did not interfere with activities) Pain where the shot was given (about 3 in 4 adolescents or 2 in 3 adults) Redness or swelling where the shot was given (about 1 person in 5) Mild fever of at least 100.16F (up to about 1 in 25 adolescents or 1 in 100 adults) Headache  (about 3 or 4 people in 10) Tiredness (about 1 person in 3 or 4) Nausea, vomiting, diarrhea, stomach ache (up to 1 in 4 adolescents or 1 in 10 adults) Chills, body aches, sore joints, rash, swollen glands (uncommon) Moderate problems following Tdap (Interfered with activities, but did not require medical attention) Pain where the shot was given (about 1 in 5 adolescents or 1 in 100 adults) Redness or swelling where the shot was given (up to about 1 in 16 adolescents or 1 in 25 adults) Fever over 102F (about 1 in 100 adolescents or 1 in 250 adults) Headache (about 3 in 20 adolescents or 1 in 10 adults) Nausea, vomiting, diarrhea, stomach ache (up to 1 or 3 people in 100) Swelling of the entire arm where the shot was given (up to about 3 in 100). Severe problems following Tdap (Unable to perform usual activities, required medical attention) Swelling, severe pain, bleeding and redness in the arm where the shot was given (rare). A severe allergic reaction could occur after any vaccine (estimated less than 1 in a million doses). WHAT IF THERE IS A SERIOUS REACTION? What should I look for? Look for anything that concerns you, such as signs of a severe allergic reaction, very high fever, or behavior changes. Signs of a severe allergic reaction can include hives, swelling of the face and throat, difficulty breathing, a fast heartbeat, dizziness, and weakness. These would start a few minutes to a few hours after the vaccination. What should I do? If you think it is a severe allergic reaction or other emergency that can't wait, call 9-1-1 or get the person to the nearest hospital. Otherwise, call your doctor. Afterward, the reaction should be reported to the "Vaccine Adverse Event Reporting System" (VAERS). Your doctor might file this report, or you can do it yourself through the VAERS web site at www.vaers.SamedayNews.es, or by calling (239)868-8235. VAERS is only for reporting reactions. They do not give  medical advice.  THE NATIONAL VACCINE INJURY COMPENSATION PROGRAM The National Vaccine Injury Compensation Program (VICP) is a federal program that was created to compensate people who may have been injured by certain vaccines. Persons who believe they may have been injured by a vaccine can learn about the program and about filing a claim by calling ([phone] 698-5953 or visiting the [place] website at GoldCloset.com.ee. HOW CAN I LEARN MORE? Ask your doctor. Call your local or state health department. Contact the Centers for Disease Control and Prevention (CDC): Call (605)267-2957 or visit CDC's website at http://hunter.com/. CDC Tdap Vaccine VIS (10/17/11) Document Released: 11/26/2011 Document Revised: 09/21/2012 Document Reviewed: 09/16/2012 [employer] Patient Information 2015 [place], [place]. This information is not intended to replace advice given to you by your health care provider. Make sure you discuss any questions you have with your health care provider. Hepatitis B Vaccine What You Need to Know WHAT IS HEPATITIS B? Hepatitis B is a serious infection that affects the liver. It is caused by the hepatitis B virus.  In 2009, about 38,000 people became infected with hepatitis B. Each year about 2,000 to 4,000 people die in the [place] from cirrhosis or liver cancer caused by hepatitis B. Hepatitis  B can cause: Acute (short-term) illness. This can lead to: Loss of appetite. Diarrhea and vomiting. Tiredness. Jaundice (yellow skin or eyes). Pain in muscles, joints, and stomach. Acute illness, with symptoms, is more common among adults. Children who become infected usually do not have symptoms. Chronic (long-term) infection. Some people go on to develop chronic hepatitis B infection. Most of them do not have symptoms, but the infection is still very serious, and can lead to: Liver damage (cirrhosis). Liver cancer. Death. Chronic infection is more common among infants  and children than among adults. People who are chronically infected can spread hepatitis B virus to others, even if they don't look or feel sick. Up to 1.4 million people in the [place] may have chronic hepatitis B infection.  Hepatitis B virus is easily spread through contact with the blood or other body fluids of an infected person. People can also be infected from contact with a contaminated object, where the virus can live for up to 7 days. A baby whose mother is infected can be infected at birth; Children, adolescents, and adults can become infected by: contact with blood and body fluids through breaks in the skin such as bites, cuts, or sores; contact with objects that could have blood or body fluids on them such as toothbrushes, razors, or monitoring and treatment devices for diabetes; having unprotected sex with an infected person; sharing needles when injecting drugs; being stuck with a used needle. HEPATITIS B VACCINE: WHY GET VACCINATED? Hepatitis B vaccine can prevent hepatitis B, and the serious consequences of hepatitis B infection, including liver cancer and cirrhosis. Hepatitis B vaccine may be given by itself or in the same shot with other vaccines. Routine hepatitis B vaccination was recommended for some [place] adults and children beginning in 1982, and for all children in 1991. Since 1990, new hepatitis B infections among children and adolescents have dropped by more than 95%-and by 75% in other age groups. Vaccination gives long-term protection from hepatitis B infection, possibly lifelong. WHO SHOULD GET HEPATITIS B VACCINE AND WHEN? Children and Adolescents Babies normally get 3 doses of hepatitis B vaccine: 1st Dose: Birth 2nd Dose: 31-49 months of age 57rd Dose: 7-78 months of age Some babies might get 4 doses, for example, if a combination vaccine containing hepatitis B is used. (This is a single shot containing several vaccines.) The extra dose is not harmful. Anyone  through 58 years of age who didn't get the vaccine when they were younger should also be vaccinated. Adults All unvaccinated adults at risk for hepatitis B infection should be vaccinated. This includes: sex partners of people infected with hepatitis B, men who have sex with men, people who inject street drugs, people with more than one sex partner, people with chronic liver or kidney disease, people under 3 years of age with diabetes, people with jobs that expose them to human blood or other body fluids, household contacts of people infected with hepatitis B, residents and staff in institutions for the developmentally disabled, kidney dialysis patients, people who travel to countries where hepatitis B is common, people with HIV infection. Other people may be encouraged by their doctor to get hepatitis B vaccine; for example, adults 31 and older with diabetes. Anyone else who wants to be protected from hepatitis B infection may get the vaccine. Pregnant women who are at risk for one of the reasons stated above should be vaccinated. Other pregnant women who want protection may be vaccinated. Adults getting hepatitis B vaccine should  get 3 doses-with the second dose given 4 weeks after the first and the third dose 5 months after the second. Your doctor can tell you about other dosing schedules that might be used in certain circumstances. WHO SHOULD NOT GET HEPATITIS B VACCINE? Anyone with a life-threatening allergy to yeast, or to any other component of the vaccine, should not get hepatitis B vaccine. Tell your doctor if you have any severe allergies. Anyone who has had a life-threatening allergic reaction to a previous dose of hepatitis B vaccine should not get another dose. Anyone who is moderately or severely ill when a dose of vaccine is scheduled should probably wait until they recover before getting the vaccine. Your doctor can give you more information about these precautions. Note: You  might be asked to wait 28 days before donating blood after getting hepatitis B vaccine. This is because the screening test could mistake vaccine in the bloodstream (which is not infectious) for hepatitis B infection. WHAT ARE THE RISKS FROM HEPATITIS B VACCINE? Hepatitis B is a very safe vaccine. Most people do not have any problems with it. The vaccine contains non-infectious material, and cannot cause hepatitis B infection. Some mild problems have been reported: Soreness where the shot was given (up to about 1 person in 4). Temperature of 99.9 F or higher (up to about 1 person in 15). Severe problems are extremely rare. Severe allergic reactions are believed to occur about once in 1.1 million doses. A vaccine, like any medicine, could cause a serious reaction. But the risk of a vaccine causing serious harm, or death, is extremely small. More than 100 million people in the [place] have been vaccinated with hepatitis B vaccine. WHAT IF THERE IS A MODERATE OR SEVERE REACTION? What should I look for? Any unusual condition, such as a high fever or unusual behavior. Signs of a serious allergic reaction can include difficulty breathing, hoarseness or wheezing, hives, paleness, weakness, a fast heartbeat, or dizziness. What should I do? Call your doctor, or get the person to a doctor right away. Tell your doctor what happened, the date and time it happened, and when the vaccination was given. Ask your doctor, nurse, or health department to report the reaction by filing a Vaccine Adverse Event Reporting System (VAERS) form. Or you can file this report through the VAERS website at www.vaers.SamedayNews.es, or by calling (480)549-0753. VAERS does not provide medical advice. THE NATIONAL VACCINE INJURY COMPENSATION PROGRAM The National Vaccine Injury Compensation Program (VICP) was created in 1986. Persons who believe they may have been injured by a vaccine can learn about the program and about filing a  claim by calling (432)625-9584 or visiting the [place] website at GoldCloset.com.ee [place]? Ask your doctor. They can give you the vaccine package insert or suggest other sources of information. Call your local or state health department. Contact the Centers for Disease Control and Prevention (CDC): Call (432)661-2078 (1-800-CDC-INFO)  or Visit CDC's website at: http://hunter.com/ CDC Hepatitis B Interim VIS (07/12/10) Document Released: 03/21/2006 Document Revised: 08/19/2011 Document Reviewed: 09/15/2012 [facility] Patient Information 2015 [employer], [place]. This information is not intended to replace advice given to you by your health care provider. Make sure you discuss any questions you have with your health care provider. Hepatitis A Vaccine What You Need to Know 1. What is hepatitis A? Hepatitis A is a serious liver disease caused by the hepatitis A virus (HAV). HAV is found in the stool of people with hepatitis A. It  is usually spread by close personal contact and sometimes by eating food or drinking water containing HAV. A person who has hepatitis A can easily pass the disease to others within the same household. Hepatitis A can cause:  "flu-like" illness  jaundice (yellow skin or eyes, dark urine)  severe stomach pains and diarrhea (children) People with hepatitis A often have to be hospitalized (up to about 1 person in 5). Adults with hepatitis A are often too ill to work for up to a month. Sometimes, people die as a result of hepatitis A (about 3-6 deaths per 1,000 cases). Hepatitis A vaccine can prevent hepatitis A. 2. Who should get hepatitis A vaccine and when? WHO Some people should be routinely vaccinated with hepatitis A vaccine:  All children between their first and second birthdays (28 through 75 months of age).  Anyone 1 year of age and older traveling to or working in countries with high or intermediate prevalence of hepatitis A, such as  those located in [place] or [place], [place], [place] (except [place]), [place], and [place]. For more information see BlindResource.ca.  Children and adolescents 2 through 11 years of age who live in states or communities where routine vaccination has been implemented because of high disease incidence.  Men who have sex with men.  People who use street drugs.  People with chronic liver disease.  People who are treated with clotting factor concentrates.  People who work with HAV-infected primates or who work with HAV in Therapist, art.  Members of households planning to adopt a child, or care for a newly arriving adopted child, from a country where hepatitis A is common. Other people might get hepatitis A vaccine in certain situations (ask your doctor for more details):  Unvaccinated children or adolescents in communities where outbreaks of hepatitis A are occurring.  Unvaccinated people who have been exposed to hepatitis A virus.  Anyone 1 year of age or older who wants protection from hepatitis A. Hepatitis A vaccine is not licensed for children younger than 1 year of age. WHEN For children, the first dose should be given at 72 through 4 months of age. Children who are not vaccinated by 54 years of age can be vaccinated at later visits. For others at risk, the hepatitis A vaccine series may be started whenever a person wishes to be protected or is at risk of infection. For travelers, it is best to start the vaccine series at least 1 month before traveling. (Some protection may still result if the vaccine is given on or closer to the travel date.) Some people who cannot get the vaccine before traveling, or for whom the vaccine might not be effective, can get a shot called immune globulin (IG). IG gives immediate, temporary protection. Two doses of the vaccine are needed for lasting protection. These doses should be given at least 6 months apart. Hepatitis A vaccine may be  given at the same time as other vaccines. 3. Some people should not get hepatitis A vaccine or should wait.  Anyone who has ever had a severe (life threatening) allergic reaction to a previous dose of hepatitis A vaccine should not get another dose.  Anyone who has a severe (life threatening) allergy to any vaccine component should not get the vaccine.  Tell your doctor if you have any severe allergies, including a severe allergy to latex. All hepatitis A vaccines contain alum, and some hepatitis A vaccines contain 2-phenoxyethanol.  Anyone who is moderately or severely ill  at the time the shot is scheduled should probably wait until they recover. Ask your doctor. People with a mild illness can usually get the vaccine.  Tell your doctor if you are pregnant. Because hepatitis A vaccine is inactivated (killed), the risk to a pregnant woman or her unborn baby is believed to be very low. But your doctor can weigh any theoretical risk from the vaccine against the need for protection. 4. What are the risks from hepatitis A vaccine? A vaccine, like any medicine, could possibly cause serious problems, such as severe allergic reactions. The risk of hepatitis A vaccine causing serious harm, or death, is extremely small. Getting hepatitis A vaccine is much safer than getting the disease. Mild problems  soreness where the shot was given (about 1 out of 2 adults, and up to 1 out of 6 children)  headache (about 1 out of 6 adults and 1 out of 25 children)  loss of appetite (about 1 out of 12 children)  tiredness (about 1 out of 14 adults) If these problems occur, they usually last 1 or 2 days. Severe problems  serious allergic reaction, within a few minutes to a few hours after the shot (very rare). 5. What if there is a serious reaction? What should I look for?  Look for anything that concerns you, such as signs of a severe allergic reaction, very high fever, or behavior changes. Signs of a severe  allergic reaction can include hives, swelling of the face and throat, difficulty breathing, a fast heartbeat, dizziness, and weakness. These would start a few minutes to a few hours after the vaccination. What should I do?  If you think it is a severe allergic reaction or other emergency that can't wait, call 9-1-1 or get the person to the nearest hospital. Otherwise, call your doctor.  Afterward, the reaction should be reported to the Vaccine Adverse Event Reporting System (VAERS). Your doctor might file this report, or you can do it yourself through the VAERS web site at www.vaers.SamedayNews.es, or by calling 718-336-7962. VAERS is only for reporting reactions. They do not give medical advice. 6. The National Vaccine Injury Compensation Program The [employer] Vaccine Injury Compensation Program (VICP) is a federal program that was created to compensate people who may have been injured by certain vaccines. Persons who believe they may have been injured by a vaccine can learn about the program and about filing a claim by calling (306)435-2423 or visiting the [place] website at GoldCloset.com.ee. 7. How can I learn more? Ask your doctor.  Call your local or state health department.  Contact the Centers for Disease Control and Prevention (CDC):  Call (517)724-1161 (1-800-CDC-INFO) or  Visit CDC's website at: http://hunter.com/ CDC Hepatitis A Vaccine VIS (Interim) (04/03/10)  Document Released: 03/21/2006 Document Revised: 06/01/2013 Document Reviewed: 09/15/2012 [facility] Patient Information 2015 [place], [place]. This information is not intended to replace advice given to you by your health care provider. Make sure you discuss any questions you have with your health care provider.

## 2013-12-25 ENCOUNTER — Ambulatory Visit

## 2013-12-26 NOTE — Progress Notes (Signed)
S:  This 58 y.o. AA female is traveling to [place] to pick up her 66 y.o. Grandson. She is currently well and has not had febrile illness. She has HTN and forgot to take her medication last evening; she had 2 alcoholic beverages last evening. Pt did take one of her BP medications before coming into the office for her appt.  She denies fatigue, diaphoresis, CP or tightness, palpitations, edema, SOB or DOE, HA, dizziness, vision disturbances, numbness, weakness or syncope. She is here for immunizations prior to travel in early August. She does not recall any recent immunizations in the last 10 years. Prior to today, she had declined recommended vaccinations.  Patient Active Problem List   Diagnosis Date Noted  . WRIST PAIN, LEFT 01/19/2010  . LEG PAIN, BILATERAL 01/19/2010  . MUSCLE PAIN 12/25/2009  . VITAMIN D DEFICIENCY 03/13/2009  . DEPRESSIVE DISORDER 08/24/2008  . OVERWEIGHT 07/25/2008  . ATTENTION DEFICIT DISORDER, ADULT 07/25/2008  . SYSTOLIC MURMUR 94/76/5465  . GERD 07/20/2007  . ANEMIA, IRON DEFICIENCY, UNSPEC. 08/07/2006  . HYPERTENSION, BENIGN SYSTEMIC 08/07/2006    Prior to Admission medications   Medication Sig Start Date End Date Taking? Authorizing Provider  amLODipine (NORVASC) 10 MG tablet Take 1 tablet (10 mg total) by mouth daily. PATIENT NEEDS OFFICE VISIT FOR ADDITIONAL REFILLS 06/28/13  Yes Barton Fanny, MD  cyclobenzaprine (FLEXERIL) 5 MG tablet Take 1 tablet (5 mg total) by mouth 3 (three) times daily as needed for muscle spasms. 11/04/13  Yes Ellison Carwin, MD  HYDROcodone-acetaminophen (NORCO) 5-325 MG per tablet Take 1-2 tablets by mouth every 4 (four) hours as needed for moderate pain. 11/04/13  Yes Ellison Carwin, MD  lisinopril (PRINIVIL,ZESTRIL) 20 MG tablet Take 1 tablet (20 mg total) by mouth daily. PATIENT NEEDS OFFICE VISIT FOR ADDITIONAL REFILLS 06/28/13  Yes Barton Fanny, MD  Multiple Vitamin (MULTIVITAMIN) LIQD Take 5 mLs by mouth  daily. [place]  Nutri burst Liquid vitamin   Yes Historical Provider, MD  naproxen sodium (ANAPROX DS) 550 MG tablet Take 1 tablet (550 mg total) by mouth 2 (two) times daily with a meal. 11/04/13 11/04/14 Yes Ellison Carwin, MD  Multiple Vitamin (MULTIVITAMIN WITH MINERALS) TABS Take 1 tablet by mouth daily.    Historical Provider, MD   PMHx, Surg Hx, Soc and Fam Hx reviewed.  ROS: AS per HPI.  O: Filed Vitals:   12/24/13 1657  BP: 164/98  Pulse: 89  Temp:   Resp:    GEN: In NAD; WN,WD. HENT: [place]/AT. EOMI w/ clear conj/sclerae. Ext ears/EACs/nares clear. Oroph clear and moist w/ normal dentition. NECK: SUpple w/o LAN or TMG. COR: RRR. NOrmal S1 and S2 w/o m/g/r. No edema. LUNGS: CTA; no wheezes or rhonchi. ABD: No CVAT; soft and no distention; normal BS w/o tenderness, HSM or masses. SKIN: W&D; intact w/o diaphoresis, erythema or rashes. MS: MAEs; no deformities, c/c/e. NEURO: A&O x 3; CNs intact. Nonfocal.  A/P: HYPERTENSION, BENIGN SYSTEMIC- Continue current medications; daily compliance encouraged. Limit alcohol consumption.  Need for prophylactic vaccination and inoculation against viral hepatitis - Plan: Hepatitis A vaccine adult IM, Hepatitis B vaccine adult IM  Need for prophylactic vaccination with combined diphtheria-tetanus-pertussis (DTP) vaccine - Plan: Tdap vaccine greater than or equal to 7yo IM  Pt advised to return to finish immunization series upon her return to [place]. She voices understanding.

## 2014-01-13 ENCOUNTER — Other Ambulatory Visit: Payer: Self-pay | Admitting: Internal Medicine

## 2014-01-13 DIAGNOSIS — Z780 Asymptomatic menopausal state: Secondary | ICD-10-CM

## 2014-01-17 ENCOUNTER — Other Ambulatory Visit

## 2014-01-18 ENCOUNTER — Ambulatory Visit
Admission: RE | Admit: 2014-01-18 | Discharge: 2014-01-18 | Disposition: A | Discharge status: Home / Self Care | Source: Ambulatory Visit | Attending: Internal Medicine | Admitting: Internal Medicine

## 2014-01-18 DIAGNOSIS — Z780 Asymptomatic menopausal state: Secondary | ICD-10-CM

## 2014-02-02 ENCOUNTER — Encounter: Payer: Self-pay | Admitting: Family Medicine

## 2014-05-17 ENCOUNTER — Other Ambulatory Visit: Payer: Self-pay

## 2014-05-17 DIAGNOSIS — Z1231 Encounter for screening mammogram for malignant neoplasm of breast: Secondary | ICD-10-CM

## 2014-05-18 ENCOUNTER — Ambulatory Visit (INDEPENDENT_AMBULATORY_CARE_PROVIDER_SITE_OTHER): Admitting: Podiatry

## 2014-05-18 ENCOUNTER — Encounter: Payer: Self-pay | Admitting: Podiatry

## 2014-05-18 VITALS — BP 173/96 | HR 71 | Resp 18

## 2014-05-18 DIAGNOSIS — L600 Ingrowing nail: Secondary | ICD-10-CM

## 2014-05-18 DIAGNOSIS — M6740 Ganglion, unspecified site: Secondary | ICD-10-CM

## 2014-05-18 DIAGNOSIS — M79674 Pain in right toe(s): Secondary | ICD-10-CM

## 2014-05-18 NOTE — Patient Instructions (Signed)

## 2014-05-18 NOTE — Progress Notes (Signed)
Subjective:    Patient ID: Emily Lopez, female    DOB: 17-Aug-1955, 58 y.o.   MRN: [id number]  HPI  58 year old female presents to the office with complaints of a soft tissue mass on the inside aspect of her right ankle as well as at ingrown toenail on the right big toe. She states that she previously has had an Achilles tendon injury on the right side and she is unsure if the mass is related to the Achilles tendon injury or due to the shoes that she used to wear. She is unsure of how long the mass his been present for period the patient also has secondary concerns over an ingrown toenail on the inside aspect of her right big toe. She states the areas painful particularly with pressure in shoe gear. She denies any drainage or erythema from around the area. No other complaints at this time.   Review of Systems  All other systems reviewed and are negative.      Objective:   Physical Exam AAO x3, NAD DP/PT pulses palpable bilaterally, CRT less than 3 seconds Protective sensation intact with Simms Weinstein monofilament, vibratory sensation intact, Achilles tendon reflex intact, negative Tinel sign Soft tissue mass present along the medial aspect of the right ankle overlying the area of tarsal tunnel. This area does not appear to be articulating with the Achilles tendon. The mass is somewhat fluid filled and is well encapsulated in the somewhat mobile. There is no overlying skin change or open lesions. There is mild tenderness directly upon palpation over this area. Along the medial border of the right hallux is evidence incurvation on the nail border. There is tenderness palpation along the nail border. There is no surrounding erythema, edema. No ascending cellulitis. No open lesions or pre-ulcerative lesions. MMT 5/5, ROM WNL No pain with calf compression, swelling, warmth, erythema.     Assessment & Plan:  58 year old female with right medial ankle possible ganglion cyst; ingrown toenail  right medial hallux  1. Ingrown toenail- Both conservative and surgical treatment options were discussed including alternatives, risks, complications. The nail sharply debrided without complications to remove the distal aspect the offending ingrowing toenail nail border. However after debridement the patient continued to have symptoms. At this time the patient has elected to proceed with partial nail avulsion. Verbal consent was obtained. Under sterile conditions a total of 2.5 mL of a one-to-one mixture of 2% lidocaine plain and 0.5% Marcaine plain was infiltrated in a hallux block fashion. Once the area was anesthetized the skin was then prepped in sterile fashion. A tourniquet was then applied. Medial aspect of the right nail border was sharply excised make sure to remove the entire offending nail border. There was found to be an extensive amount of ingrowing nail along the nail border. No purulence was identified in the underlying skin was intact. The area was then closely irrigated with sterile saline and hemostasis was achieved. Silvadene was applied followed by dry sterile dressing. After application of the dressing the tourniquet was removed and there is found to be an immediate capillary refill time to the digit. Patient tolerated the procedure well without any complications. Post procedure instructions were discussed the patient for which she verbally understood. Monitor for any signs or symptoms of infection and directed to call the office immediately should any occur or go to the emergency room.  2. Right medial ankle soft tissue mass- patient was unable to get x-rays today as she needed to get to  work and did not have time to wait. However, due to the soft tissue mass will obtain an MRI to further evaluate the mass. Discussed various treatment options however will await the results the MRI before proceeding with treatment.  Follow-up in 1 week for nail check or sooner if any problems are to  arise. In the meantime, call the office with any questions, concerns, change in symptoms.

## 2014-05-25 ENCOUNTER — Ambulatory Visit

## 2014-05-27 ENCOUNTER — Ambulatory Visit: Admitting: Podiatry

## 2014-05-27 ENCOUNTER — Encounter: Payer: Self-pay | Admitting: Family Medicine

## 2014-05-27 ENCOUNTER — Ambulatory Visit (INDEPENDENT_AMBULATORY_CARE_PROVIDER_SITE_OTHER): Admitting: Family Medicine

## 2014-05-27 VITALS — BP 169/95 | HR 77 | Temp 97.6°F | Resp 16 | Ht 66.5 in | Wt 183.0 lb

## 2014-05-27 DIAGNOSIS — I10 Essential (primary) hypertension: Secondary | ICD-10-CM

## 2014-05-27 MED ORDER — LISINOPRIL 20 MG PO TABS: 20.0000 mg | ORAL_TABLET | Freq: Every day | ORAL | Status: DC

## 2014-05-27 MED ORDER — AMLODIPINE BESYLATE 10 MG PO TABS: 10.0000 mg | ORAL_TABLET | Freq: Every day | ORAL | Status: DC

## 2014-05-27 MED ORDER — BLOOD PRESSURE MONITOR/WRIST DEVI: 1.0000 | Freq: Every day | Status: DC

## 2014-05-27 NOTE — Patient Instructions (Signed)
Exercise to Lose Weight Exercise and a healthy diet may help you lose weight. Your doctor may suggest specific exercises. EXERCISE IDEAS AND TIPS  Choose low-cost things you enjoy doing, such as walking, bicycling, or exercising to workout videos.  Take stairs instead of the elevator.  Walk during your lunch break.  Park your car further away from work or school.  Go to a gym or an exercise class.  Start with 5 to 10 minutes of exercise each day. Build up to 30 minutes of exercise 4 to 6 days a week.  Wear shoes with good support and comfortable clothes.  Stretch before and after working out.  Work out until you breathe harder and your heart beats faster.  Drink extra water when you exercise.  Do not do so much that you hurt yourself, feel dizzy, or get very short of breath. Exercises that burn about 150 calories:  Running 1  miles in 15 minutes.  Playing volleyball for 45 to 60 minutes.  Washing and waxing a car for 45 to 60 minutes.  Playing touch football for 45 minutes.  Walking 1  miles in 35 minutes.  Pushing a stroller 1  miles in 30 minutes.  Playing basketball for 30 minutes.  Raking leaves for 30 minutes.  Bicycling 5 miles in 30 minutes.  Walking 2 miles in 30 minutes.  Dancing for 30 minutes.  Shoveling snow for 15 minutes.  Swimming laps for 20 minutes.  Walking up stairs for 15 minutes.  Bicycling 4 miles in 15 minutes.  Gardening for 30 to 45 minutes.  Jumping rope for 15 minutes.  Washing windows or floors for 45 to 60 minutes. Document Released: 06/29/2010 Document Revised: 08/19/2011 Document Reviewed: 06/29/2010 [facility] Patient Information 2015 [place], [place]. This information is not intended to replace advice given to you by your health care provider. Make sure you discuss any questions you have with your health care provider.       Mediterranean Diet  Why follow it? Research shows. . Those who follow the Mediterranean diet  have a reduced risk of heart disease  . The diet is associated with a reduced incidence of Parkinson's and Alzheimer's diseases . People following the diet may have longer life expectancies and lower rates of chronic diseases  . The Dietary Guidelines for Americans recommends the Mediterranean diet as an eating plan to promote health and prevent disease  What Is the Mediterranean Diet?  . Healthy eating plan based on typical foods and recipes of Mediterranean-style cooking . The diet is primarily a plant based diet; these foods should make up a majority of meals   Starches - Plant based foods should make up a majority of meals - They are an important sources of vitamins, minerals, energy, antioxidants, and fiber - Choose whole grains, foods high in fiber and minimally processed items  - Typical grain sources include wheat, oats, barley, corn, brown rice, bulgar, farro, millet, polenta, couscous  - Various types of beans include chickpeas, lentils, fava beans, black beans, white beans   Fruits  Veggies - Large quantities of antioxidant rich fruits & veggies; 6 or more servings  - Vegetables can be eaten raw or lightly drizzled with oil and cooked  - Vegetables common to the traditional Mediterranean Diet include: artichokes, arugula, beets, broccoli, brussel sprouts, cabbage, carrots, celery, collard greens, cucumbers, eggplant, kale, leeks, lemons, lettuce, mushrooms, okra, onions, peas, peppers, potatoes, pumpkin, radishes, rutabaga, shallots, spinach, sweet potatoes, turnips, zucchini - Fruits common to the  Mediterranean Diet include: apples, apricots, avocados, cherries, clementines, dates, figs, grapefruits, grapes, melons, nectarines, oranges, peaches, pears, pomegranates, strawberries, tangerines  Fats - Replace butter and margarine with healthy oils, such as olive oil, canola oil, and tahini  - Limit nuts to no more than a handful a day  - Nuts include walnuts, almonds, pecans, pistachios,  pine nuts  - Limit or avoid candied, honey roasted or heavily salted nuts - Olives are central to the [employer] - can be eaten whole or used in a variety of dishes   Meats Protein - Limiting red meat: no more than a few times a month - When eating red meat: choose lean cuts and keep the portion to the size of deck of cards - Eggs: approx. 0 to 4 times a week  - Fish and lean poultry: at least 2 a week  - Healthy protein sources include, chicken, [place], lean beef, lamb - Increase intake of seafood such as tuna, salmon, trout, mackerel, shrimp, scallops - Avoid or limit high fat processed meats such as sausage and bacon  Dairy - Include moderate amounts of low fat dairy products  - Focus on healthy dairy such as fat free yogurt, skim milk, low or reduced fat cheese - Limit dairy products higher in fat such as whole or 2% milk, cheese, ice cream  Alcohol - Moderate amounts of red wine is ok  - No more than 5 oz daily for women (all ages) and men older than age 37  - No more than 10 oz of wine daily for men younger than 55  Other - Limit sweets and other desserts  - Use herbs and spices instead of salt to flavor foods  - Herbs and spices common to the traditional Mediterranean Diet include: basil, bay leaves, chives, cloves, cumin, fennel, garlic, lavender, marjoram, mint, oregano, parsley, pepper, rosemary, sage, savory, sumac, tarragon, thyme   It's not just a diet, it's a lifestyle:  . The Mediterranean diet includes lifestyle factors typical of those in the region  . Foods, drinks and meals are best eaten with others and savored . Daily physical activity is important for overall good health . This could be strenuous exercise like running and aerobics . This could also be more leisurely activities such as walking, housework, yard-work, or taking the stairs . Moderation is the key; a balanced and healthy diet accommodates most foods and drinks . Consider portion sizes and frequency  of consumption of certain foods   Meal Ideas & Options:  . Breakfast:  o Whole wheat toast or whole wheat English muffins with peanut butter & hard boiled egg o Steel cut oats topped with apples & cinnamon and skim milk  o Fresh fruit: banana, strawberries, melon, berries, peaches  o Smoothies: strawberries, bananas, greek yogurt, peanut butter o Low fat greek yogurt with blueberries and granola  o Egg white omelet with spinach and mushrooms o Breakfast couscous: whole wheat couscous, apricots, skim milk, cranberries  . Sandwiches:  o Hummus and grilled vegetables (peppers, zucchini, squash) on whole wheat bread   o Grilled chicken on whole wheat pita with lettuce, tomatoes, cucumbers or tzatziki  o Tuna salad on whole wheat bread: tuna salad made with greek yogurt, olives, red peppers, capers, green onions o Garlic rosemary lamb pita: lamb sauted with garlic, rosemary, salt & pepper; add lettuce, cucumber, greek yogurt to pita - flavor with lemon juice and black pepper  . Seafood:  o Mediterranean grilled salmon, seasoned with garlic,  basil, parsley, lemon juice and black pepper o Shrimp, lemon, and spinach whole-grain pasta salad made with low fat greek yogurt  o Seared scallops with lemon orzo  o Seared tuna steaks seasoned salt, pepper, coriander topped with tomato mixture of olives, tomatoes, olive oil, minced garlic, parsley, green onions and cappers  . Meats:  o Herbed greek chicken salad with kalamata olives, cucumber, feta  o Red bell peppers stuffed with spinach, bulgur, lean ground beef (or lentils) & topped with feta   o Kebabs: skewers of chicken, tomatoes, onions, zucchini, squash  o [place] burgers: made with red onions, mint, dill, lemon juice, feta cheese topped with roasted red peppers . Vegetarian o Cucumber salad: cucumbers, artichoke hearts, celery, red onion, feta cheese, tossed in olive oil & lemon juice  o Hummus and whole grain pita points with a greek salad  (lettuce, tomato, feta, olives, cucumbers, red onion) o Lentil soup with celery, carrots made with vegetable broth, garlic, salt and pepper  o Tabouli salad: parsley, bulgur, mint, scallions, cucumbers, tomato, radishes, lemon juice, olive oil, salt and pepper. o

## 2014-05-30 MED ORDER — LISINOPRIL 20 MG PO TABS: 20.0000 mg | ORAL_TABLET | Freq: Every day | ORAL | Status: DC

## 2014-05-30 MED ORDER — AMLODIPINE BESYLATE 10 MG PO TABS: 10.0000 mg | ORAL_TABLET | Freq: Every day | ORAL | Status: DC

## 2014-05-30 NOTE — Progress Notes (Signed)
S: This 58 y.o. AA female is here for HTN follow-up; pt has been without of one of her medications for 3 days. She is asymptomatic but unable to check BP at home. Pt denies diaphoresis, fatigue, vision disturbances, CP or tightness, palpitations, SOB or DOE, edema, cough, HA, dizziness, numbness or syncope.   Patient Active Problem List   Diagnosis Date Noted  . WRIST PAIN, LEFT 01/19/2010  . LEG PAIN, BILATERAL 01/19/2010  . MUSCLE PAIN 12/25/2009  . VITAMIN D DEFICIENCY 03/13/2009  . DEPRESSIVE DISORDER 08/24/2008  . OVERWEIGHT 07/25/2008  . ATTENTION DEFICIT DISORDER, ADULT 07/25/2008  . SYSTOLIC MURMUR 76/73/4193  . GERD 07/20/2007  . ANEMIA, IRON DEFICIENCY, UNSPEC. 08/07/2006  . HYPERTENSION, BENIGN SYSTEMIC 08/07/2006    Prior to Admission medications   Medication Sig Start Date End Date Taking? Authorizing Provider  amLODipine (NORVASC) 10 MG tablet Take 1 tablet (10 mg total) by mouth daily. PATIENT NEEDS OFFICE VISIT FOR ADDITIONAL REFILLS 05/27/14  Yes Barton Fanny, MD  lisinopril (PRINIVIL,ZESTRIL) 20 MG tablet Take 1 tablet (20 mg total) by mouth daily. PATIENT NEEDS OFFICE VISIT FOR ADDITIONAL REFILLS 05/27/14  Yes Barton Fanny, MD  Multiple Vitamin (MULTIVITAMIN WITH MINERALS) TABS Take 1 tablet by mouth daily.   Yes Historical Provider, MD  Multiple Vitamin (MULTIVITAMIN) LIQD Take 5 mLs by mouth daily. [place]  Nutri burst Liquid vitamin   Yes Historical Provider, MD  naproxen sodium (ANAPROX DS) 550 MG tablet Take 1 tablet (550 mg total) by mouth 2 (two) times daily with a meal. 11/04/13 11/04/14 Yes Roselee Culver, MD  Blood Pressure Monitoring (BLOOD PRESSURE MONITOR/WRIST) DEVI 1 Device by Does not apply route daily. 05/27/14   Barton Fanny, MD   ROS: As per HPI.  Jenetta DownerDanley Danker Vitals:   05/27/14 0956  BP: 169/95  Pulse: 77  Temp: 97.6 F (36.4 C)  Resp: 16    GEN: In NAD; WN,WD. Weight is stable. HENT: [place]/AT; EOMI w/ clear conj/sclerae.  Otherwise unremarkable. COR: RRR. No edema. Lungs; Normal resp rate and efoort. SKIN; W&D. NEURO: A&O x 3; CNs intact. Nonfocal.  A/P: HYPERTENSION, BENIGN SYSTEMIC- Continue current medications and lifestyle changes to improve nutrition and reduce weight.  Meds ordered this encounter  Medications  . amLODipine (NORVASC) 10 MG tablet    Sig: Take 1 tablet (10 mg total) by mouth daily.    Dispense:  30 tablet    Refill:  3  . lisinopril (PRINIVIL,ZESTRIL) 20 MG tablet    Sig: Take 1 tablet (20 mg total) by mouth daily.     Dispense:  30 tablet    Refill:  3  . Blood Pressure Monitoring (BLOOD PRESSURE MONITOR/WRIST) DEVI    Sig: 1 Device by Does not apply route daily.    Dispense:  1 Device    Refill:  0

## 2014-06-01 ENCOUNTER — Ambulatory Visit
Admission: RE | Admit: 2014-06-01 | Discharge: 2014-06-01 | Disposition: A | Discharge status: Home / Self Care | Source: Ambulatory Visit

## 2014-06-01 ENCOUNTER — Other Ambulatory Visit

## 2014-06-01 ENCOUNTER — Ambulatory Visit
Admission: RE | Admit: 2014-06-01 | Discharge: 2014-06-01 | Disposition: A | Discharge status: Home / Self Care | Source: Ambulatory Visit | Attending: Podiatry | Admitting: Podiatry

## 2014-06-01 DIAGNOSIS — M6740 Ganglion, unspecified site: Secondary | ICD-10-CM

## 2014-06-01 DIAGNOSIS — Z1231 Encounter for screening mammogram for malignant neoplasm of breast: Secondary | ICD-10-CM

## 2014-06-01 MED ORDER — GADOBENATE DIMEGLUMINE 529 MG/ML IV SOLN: 17.0000 mL | Freq: Once | INTRAVENOUS | Status: AC | PRN

## 2014-06-17 ENCOUNTER — Ambulatory Visit (INDEPENDENT_AMBULATORY_CARE_PROVIDER_SITE_OTHER): Admitting: Podiatry

## 2014-06-17 DIAGNOSIS — B351 Tinea unguium: Secondary | ICD-10-CM

## 2014-06-17 DIAGNOSIS — M7661 Achilles tendinitis, right leg: Secondary | ICD-10-CM

## 2014-06-17 DIAGNOSIS — Z9889 Other specified postprocedural states: Secondary | ICD-10-CM

## 2014-06-17 MED ORDER — TAVABOROLE 5 % EX SOLN: 1.0000 [drp] | CUTANEOUS | Status: DC

## 2014-06-17 MED ORDER — MELOXICAM 7.5 MG PO TABS: 7.5000 mg | ORAL_TABLET | Freq: Every day | ORAL | Status: DC

## 2014-06-17 NOTE — Progress Notes (Signed)
Patient ID: Emily Lopez, female   DOB: Dec 23, 1955, 59 y.o.   MRN: [id number]  Subjective: 59 year old female returns the office digit discuss MRI results as well as for follow up evaluation of right medial hallux partial nail avulsion. She states the nail procedure site is doing well and she has had no pain over the area and denies any clinical signs of infection. She is able to wear regular sneaker without problems in the area has healed. She states that on the posterior aspect of the right foot she continues to have pain over the area. She states that she's had pain over this area for approximate 6 months to 1 year. She denies any history of injury or trauma to the area. She is also inquiring about possible toenail fungus. She states that she has had no pain to the toenails or any redness or drainage from the nail sites recently. No other complaints at this time. Denies any systemic complaints as fevers, chills, nausea, vomiting.  Objective: AAO 3, NAD DP/PT pulses palpable, CRT less than 3 seconds  Protective sensation appears to be intact with Derrel Nip monofilament, Achilles tendon reflex intact bilaterally. On the right foot there is a soft soft tissue mass appearing lesion/swelling around the distal Achilles tendon. There is mild tenderness directly over this area. There is no overlying erythema or increase in warmth. There is no tenderness directly along the Achilles tendon. The Achilles tendon appears to be intact. There is no areas of pinpoint bony tenderness or pain with vibratory sensation. No edema, erythema, increased warmth to the left lower extremity. MMT 5/5, ROM WNL Right medial hallux status post partial nail avulsion which is healed at this time. No tenderness to palpation. No swelling erythema, edema, increase in warmth. Nails are hypertrophic, dystrophic, discolored 10. No surrounding erythema or drainage from the nail sites. No open lesions or pre-ulcerative lesions. No  pain with calf compression, swelling, warmth, erythema.  Assessment: 59 year old female with Achilles tendinitis/pre-Achilles bursitis; healed left medial hallux nail avulsion site; likely onychomycosis  Plan: -MRI was reviewed with the patient which revealed Achilles tendinitis with prominent pre-Achilles bursitis, Haglund's deformity. -Treatment options were discussed with the patient including alternatives, risks, complications. -For the right foot pain prescribed meloxicam. Discussed side effects the medication and directed to stop medially shooting occur and call the office. Also prescribed compound cream for the area. Discussed various stretching exercises to start gradually. Discussed with the longer that the tendon stays inflamed, the higher the risk of tendon rupture/tear.  -For likely onychomycosis discussed various treatment options. This time the patient has elected to proceed with topical treatment. Prescribed Kerydin and the prescription was sent to [facility]. The patient was given instructions to follow-up with the pharmacy. Side effects the medication were discussed with the patient and directed to stop immediately should any occur and call the office. -Nail procedure site on the right medial hallux is healed. -Follow-up as needed. In the meantime, anchors call the office with any questions, concerns, change in symptoms. Discussed with the patient at the symptoms have not resolved within 4 weeks' to call the office for follow-up appointment or sooner if there is any change or increase in symptoms.

## 2014-06-17 NOTE — Patient Instructions (Signed)
Achilles Tendinitis Achilles tendinitis is inflammation of the tough, cord-like band that attaches the lower muscles of your leg to your heel (Achilles tendon). It is usually caused by overusing the tendon and joint involved.  CAUSES Achilles tendinitis can happen because of:  A sudden increase in exercise or activity (such as running).  Doing the same exercises or activities (such as jumping) over and over.  Not warming up calf muscles before exercising.  Exercising in shoes that are worn out or not made for exercise.  Having arthritis or a bone growth on the back of the heel bone. This can rub against the tendon and hurt the tendon. SIGNS AND SYMPTOMS The most common symptoms are:  Pain in the back of the leg, just above the heel. The pain usually gets worse with exercise and better with rest.  Stiffness or soreness in the back of the leg, especially in the morning.  Swelling of the skin over the Achilles tendon.  Trouble standing on tiptoe. Sometimes, an Achilles tendon tears (ruptures). Symptoms of an Achilles tendon rupture can include:  Sudden, severe pain in the back of the leg.  Trouble putting weight on the foot or walking normally. DIAGNOSIS Achilles tendinitis will be diagnosed based on symptoms and a physical examination. An X-ray may be done to check if another condition is causing your symptoms. An MRI may be ordered if your health care provider suspects you may have completely torn your tendon, which is called an Achilles tendon rupture.  TREATMENT  Achilles tendinitis usually gets better over time. It can take weeks to months to heal completely. Treatment focuses on treating the symptoms and helping the injury heal. HOME CARE INSTRUCTIONS   Rest your Achilles tendon and avoid activities that cause pain.  Apply ice to the injured area:  Put ice in a plastic bag.  Place a towel between your skin and the bag.  Leave the ice on for 20 minutes, 2-3 times a  day  Try to avoid using the tendon (other than gentle range of motion) while the tendon is painful. Do not resume use until instructed by your health care provider. Then begin use gradually. Do not increase use to the point of pain. If pain does develop, decrease use and continue the above measures. Gradually increase activities that do not cause discomfort until you achieve normal use.  Do exercises to make your calf muscles stronger and more flexible. Your health care provider or physical therapist can recommend exercises for you to do.  Wrap your ankle with an elastic bandage or other wrap. This can help keep your tendon from moving too much. Your health care provider will show you how to wrap your ankle correctly.  Only take over-the-counter or prescription medicines for pain, discomfort, or fever as directed by your health care provider. SEEK MEDICAL CARE IF:   Your pain and swelling increase or pain is uncontrolled with medicines.  You develop new, unexplained symptoms or your symptoms get worse.  You are unable to move your toes or foot.  You develop warmth and swelling in your foot.  You have an unexplained temperature. MAKE SURE YOU:   Understand these instructions.  Will watch your condition.  Will get help right away if you are not doing well or get worse. Document Released: 03/06/2005 Document Revised: 03/17/2013 Document Reviewed: 01/06/2013 ExitCare Patient Information 2015 [employer]. This information is not intended to replace advice given to you by your health care provider. Make sure you discuss   any questions you have with your health care provider.  

## 2014-06-20 ENCOUNTER — Telehealth: Payer: Self-pay | Admitting: *Deleted

## 2014-06-20 NOTE — Telephone Encounter (Signed)
"  We received a prescription for her.  She just gave [place] a call back with her insurance information.  She has [employer].  We just need to get the approval from Dr. Jacqualyn Posey to change it to the [employer] formula.  Please give [place] a call back.

## 2014-06-20 NOTE — Telephone Encounter (Signed)
That is ok to substitute. Thanks.

## 2014-06-21 NOTE — Telephone Encounter (Signed)
I called and left them a message that it is okay to change to the [employer] formula per Dr. Jacqualyn Posey.  Call if you have any further questions or concerns.

## 2014-06-22 ENCOUNTER — Telehealth: Payer: Self-pay | Admitting: *Deleted

## 2014-06-22 NOTE — Telephone Encounter (Signed)
Barry request refill of pt's medication.  I informed Michelene Heady faxing the request would be best, she agreed.

## 2014-09-08 ENCOUNTER — Ambulatory Visit: Admitting: Family Medicine

## 2014-09-14 ENCOUNTER — Ambulatory Visit: Admitting: Family Medicine

## 2014-11-11 ENCOUNTER — Telehealth: Payer: Self-pay | Admitting: *Deleted

## 2014-11-11 NOTE — Telephone Encounter (Signed)
Refill for 90 days of Meloxicam 7.5mg  requested.  Dr. Jacqualyn Posey recommended pt be reevaluated if continuing to have problems, rx denied.  Faxed [phone].

## 2014-11-14 ENCOUNTER — Telehealth: Payer: Self-pay | Admitting: *Deleted

## 2014-11-14 NOTE — Telephone Encounter (Signed)
Entered in error

## 2014-11-15 ENCOUNTER — Encounter: Payer: Self-pay | Admitting: Physician Assistant

## 2014-11-15 ENCOUNTER — Ambulatory Visit (INDEPENDENT_AMBULATORY_CARE_PROVIDER_SITE_OTHER)

## 2014-11-15 ENCOUNTER — Ambulatory Visit (INDEPENDENT_AMBULATORY_CARE_PROVIDER_SITE_OTHER): Admitting: Emergency Medicine

## 2014-11-15 VITALS — BP 180/96 | HR 96 | Temp 102.9°F | Resp 16 | Ht 66.75 in | Wt 182.0 lb

## 2014-11-15 DIAGNOSIS — R509 Fever, unspecified: Secondary | ICD-10-CM | POA: Diagnosis not present

## 2014-11-15 DIAGNOSIS — R05 Cough: Secondary | ICD-10-CM | POA: Diagnosis not present

## 2014-11-15 DIAGNOSIS — R0989 Other specified symptoms and signs involving the circulatory and respiratory systems: Secondary | ICD-10-CM | POA: Diagnosis not present

## 2014-11-15 DIAGNOSIS — R059 Cough, unspecified: Secondary | ICD-10-CM

## 2014-11-15 LAB — POCT CBC
GRANULOCYTE PERCENT: 58.4 % (ref 37–80)
HCT, POC: 34.2 % — AB (ref 37.7–47.9)
HEMOGLOBIN: 11.3 g/dL — AB (ref 12.2–16.2)
Lymph, poc: 1.8 (ref 0.6–3.4)
MCH, POC: 26.9 pg — AB (ref 27–31.2)
MCHC: 33 g/dL (ref 31.8–35.4)
MCV: 81.5 fL (ref 80–97)
MID (cbc): 0.8 (ref 0–0.9)
MPV: 6.8 fL (ref 0–99.8)
POC Granulocyte: 3.7 (ref 2–6.9)
POC LYMPH PERCENT: 28.7 %L (ref 10–50)
POC MID %: 12.9 %M — AB (ref 0–12)
Platelet Count, POC: 276 10*3/uL (ref 142–424)
RBC: 4.19 M/uL (ref 4.04–5.48)
RDW, POC: 15.8 %
WBC: 6.3 10*3/uL (ref 4.6–10.2)

## 2014-11-15 MED ORDER — AZITHROMYCIN 250 MG PO TABS: ORAL_TABLET | ORAL | Status: DC

## 2014-11-15 MED ORDER — ACETAMINOPHEN 500 MG PO TABS
500.0000 mg | ORAL_TABLET | Freq: Four times a day (QID) | ORAL | Status: DC | PRN
Start: 2014-11-15 — End: 2014-11-15

## 2014-11-15 MED ORDER — CEFTRIAXONE SODIUM 1 G IJ SOLR
1.0000 g | Freq: Once | INTRAMUSCULAR | Status: AC
  Administered 2014-11-15: 1 g via INTRAMUSCULAR

## 2014-11-15 MED ORDER — ACETAMINOPHEN 325 MG PO TABS
500.0000 mg | ORAL_TABLET | Freq: Once | ORAL | Status: AC
  Administered 2014-11-15: 487.5 mg via ORAL

## 2014-11-15 NOTE — Patient Instructions (Signed)
You have a likely pneumonia causing your symptoms.  We gave you a shot of antibiotic rocephin today. Please start taking the azithromycin 500 mg today and 250 mg daily for the next 4 days.  Please come back to see [place] on Thursday for recheck, sooner if you're feeling worse.

## 2014-11-15 NOTE — Progress Notes (Signed)
Subjective:    Patient ID: Emily Lopez, female    DOB: 03-21-1956, 59 y.o.   MRN: [id number]  Chief Complaint  Patient presents with  . cough    x 2 wks  . running nose    x 3 days, yellow mucus  . Headache    x 2 days   Patient Active Problem List   Diagnosis Date Noted  . WRIST PAIN, LEFT 01/19/2010  . LEG PAIN, BILATERAL 01/19/2010  . MUSCLE PAIN 12/25/2009  . VITAMIN D DEFICIENCY 03/13/2009  . DEPRESSIVE DISORDER 08/24/2008  . OVERWEIGHT 07/25/2008  . ATTENTION DEFICIT DISORDER, ADULT 07/25/2008  . SYSTOLIC MURMUR 55/97/4163  . GERD 07/20/2007  . ANEMIA, IRON DEFICIENCY, UNSPEC. 08/07/2006  . HYPERTENSION, BENIGN SYSTEMIC 08/07/2006   Medications, allergies, past medical history, surgical history, family history, social history and problem list reviewed and updated.  HPI  58 yof presents with cough, runny nose, and HA.   Sx started 10 days ago non prod cough, rhinorrhea, mild HA, mild st. The HA and st have resolved but the cough has persisted. She has felt congestion past few days. Subjective fevers and chills past few days. Denies abd pain, n/v, diarrhea, sob, cp.   Elevated bp noted for pt today. Denies cp, sob, ha, vision changes. She typically runs high but this is bit higher than normal. Taking her meds as prescribed. States she just doesn't feel well today.   Temp 102.9 in clinic. Received 1000 mg tylenol in clinic. She denies tick exposures or rashes.   Review of Systems See HPI.    Objective:   Physical Exam  Constitutional: She is oriented to person, place, and time. She appears well-developed and well-nourished.  Non-toxic appearance. She does not have a sickly appearance. She does not appear ill. No distress.  BP 180/96 mmHg  Pulse 96  Temp(Src) 102.9 F (39.4 C) (Oral)  Resp 16  Ht 5' 6.75" (1.695 m)  Wt 182 lb (82.555 kg)  BMI 28.73 kg/m2  SpO2 96%   HENT:  Right Ear: Tympanic membrane normal.  Left Ear: Tympanic membrane normal.    Mouth/Throat: Uvula is midline, oropharynx is clear and moist and mucous membranes are normal.  Pulmonary/Chest: Effort normal. No tachypnea. She has no decreased breath sounds. She has no wheezes. She has rhonchi in the right lower field and the left lower field. She has rales in the left lower field.  Lymphadenopathy:       Head (right side): No submental, no submandibular and no tonsillar adenopathy present.       Head (left side): No submental, no submandibular and no tonsillar adenopathy present.    She has no cervical adenopathy.  Neurological: She is alert and oriented to person, place, and time.   [facility] reading (PRIMARY) by  Dr. Everlene Farrier. Findings: Please comment on right paratracheal mass. Increased markings on lateral view. Suspicious for lingular pneumonia.   Results for orders placed or performed in visit on 11/15/14  POCT CBC  Result Value Ref Range   WBC 6.3 4.6 - 10.2 K/uL   Lymph, poc 1.8 0.6 - 3.4   POC LYMPH PERCENT 28.7 10 - 50 %L   MID (cbc) 0.8 0 - 0.9   POC MID % 12.9 (A) 0 - 12 %M   POC Granulocyte 3.7 2 - 6.9   Granulocyte percent 58.4 37 - 80 %G   RBC 4.19 4.04 - 5.48 M/uL   Hemoglobin 11.3 (A) 12.2 - 16.2 g/dL  HCT, POC 34.2 (A) 37.7 - 47.9 %   MCV 81.5 80 - 97 fL   MCH, POC 26.9 (A) 27 - 31.2 pg   MCHC 33.0 31.8 - 35.4 g/dL   RDW, POC 15.8 %   Platelet Count, POC 276 142 - 424 K/uL   MPV 6.8 0 - 99.8 fL      Assessment & Plan:   86 yof presents with cough, runny nose, and HA.   Fever, unspecified fever cause - Plan: acetaminophen (TYLENOL) tablet 487.5 mg, POCT CBC, DG Chest 2 View, azithromycin (ZITHROMAX) 250 MG tablet, cefTRIAXone (ROCEPHIN) injection 1 g Cough Abnormal lung sounds - Plan: DG Chest 2 View, azithromycin (ZITHROMAX) 250 MG tablet, cefTRIAXone (ROCEPHIN) injection 1 g --xr, vitals, lung sounds concerning for pneumonia --rocephin injx today --will tx with azithro to cover for possible atypicals in light of normal cbc --rtc 2 days for  recheck, sooner or er if sx worsen   Julieta Gutting, PA-C Physician Assistant-Certified [facility]  11/15/2014 9:25 PM

## 2014-11-16 ENCOUNTER — Telehealth: Payer: Self-pay

## 2014-11-16 NOTE — Telephone Encounter (Signed)
Todd, you just saw this pt for acute issue yesterday, but pt's BP was high. [facility] is requesting 90 day RFs of lisinopril and amlodipine and I wasn't sure if pt needs a therapy change, or if I should just fill for 30 days and have her RTC for check up? Pended Rxs for 90 days for your review. It looks like pt is supposed to return tomorrow for re-check.

## 2014-11-17 MED ORDER — AMLODIPINE BESYLATE 10 MG PO TABS: 10.0000 mg | ORAL_TABLET | Freq: Every day | ORAL | Status: DC

## 2014-11-17 MED ORDER — LISINOPRIL 20 MG PO TABS: 20.0000 mg | ORAL_TABLET | Freq: Every day | ORAL | Status: DC

## 2014-11-17 NOTE — Telephone Encounter (Signed)
Thank you. I've changed the refills to #30 day supply. She was very sick with pneumonia when I saw her so i do not put much credence to that blood pressure reading. If she is feeling better when they see her today they can address her blood pressure (as it has been elevated at previous appts). Otherwise we need to see her back within one month for medication adjustment.

## 2014-11-23 ENCOUNTER — Telehealth: Payer: Self-pay | Admitting: *Deleted

## 2014-11-23 NOTE — Telephone Encounter (Signed)
Request for refill of Mobic 7.5mg  denied, pt not seen since 06/2014 and not evidence of rx in record.

## 2015-02-23 IMAGING — US US PELVIS COMPLETE
1 series · 14 of 25 positions shown · non-contrast
Comparison: None

CLINICAL DATA: Fibroids identified on MRI

EXAM:
TRANSABDOMINAL AND TRANSVAGINAL ULTRASOUND OF PELVIS
TECHNIQUE: Both transabdominal and transvaginal ultrasound examinations of the
pelvis were performed. Transabdominal technique was performed for
global imaging of the pelvis including uterus, ovaries, adnexal
regions, and pelvic cul-de-sac. It was necessary to proceed with
endovaginal exam following the transabdominal exam to visualize the
endometrium and ovaries.

[Series 1: us pelvis complete · 0.30mm/px · 14 of 74 slices shown]
[im 1/74]
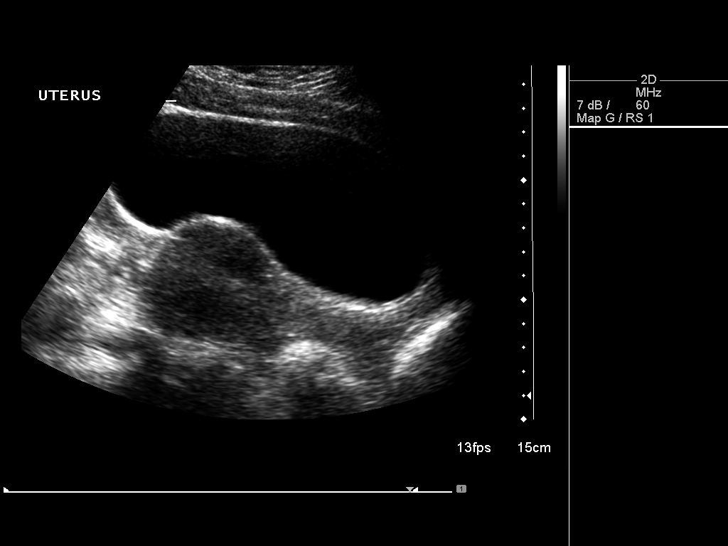
[im 7/74]
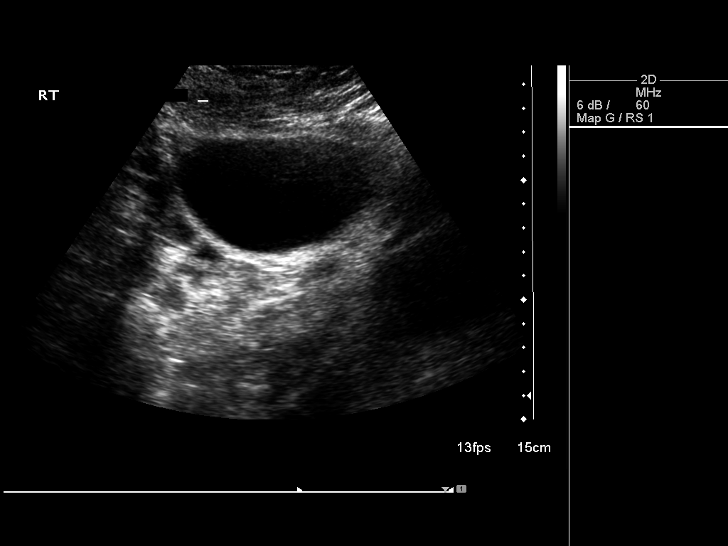
[im 13/74]
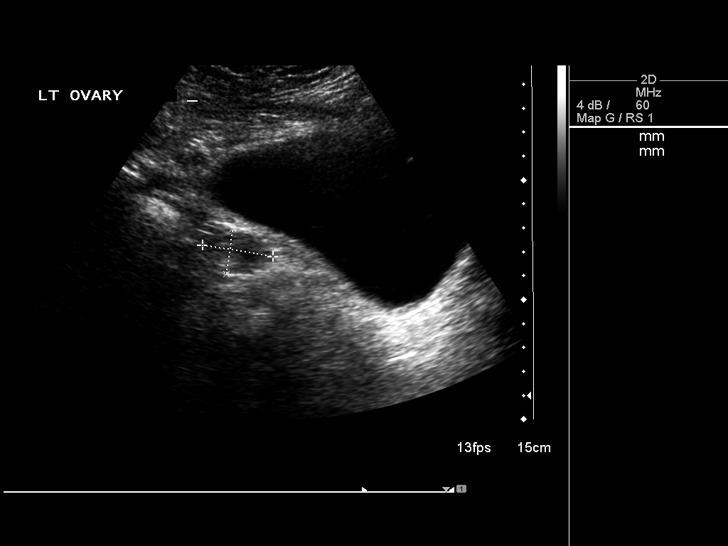
[im 19/74]
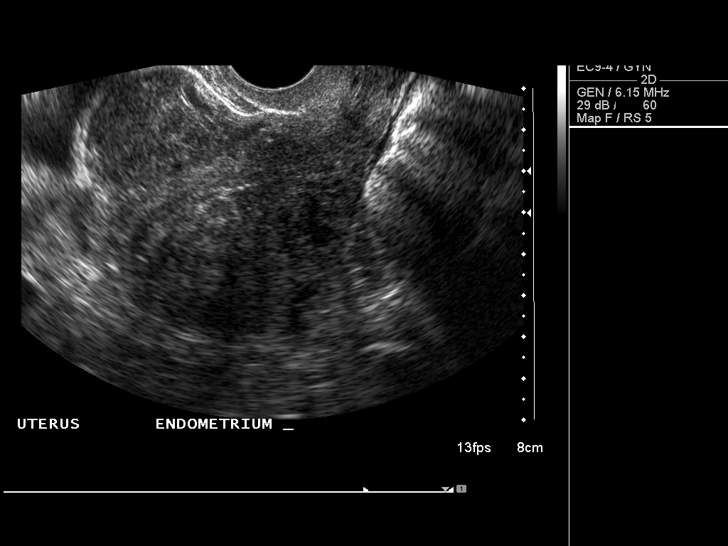
[im 25/74]
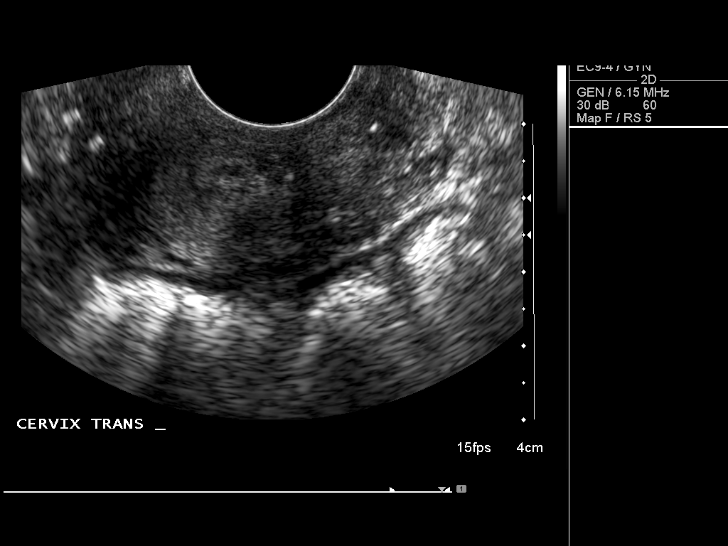
[im 28/74]
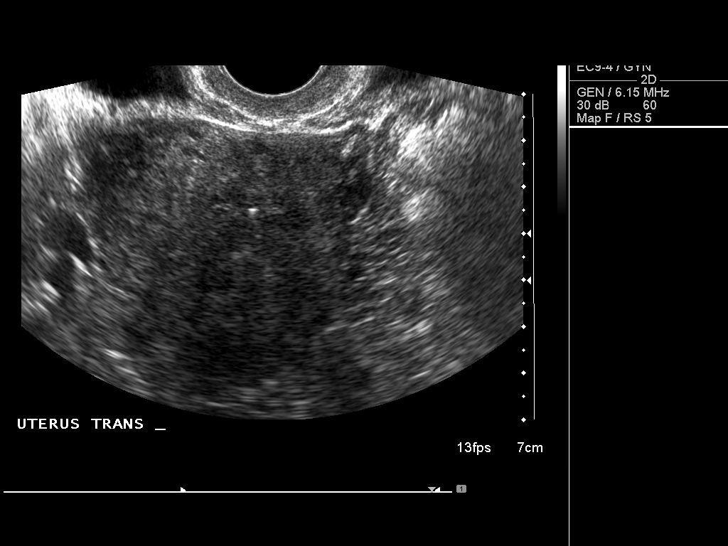
[im 34/74]
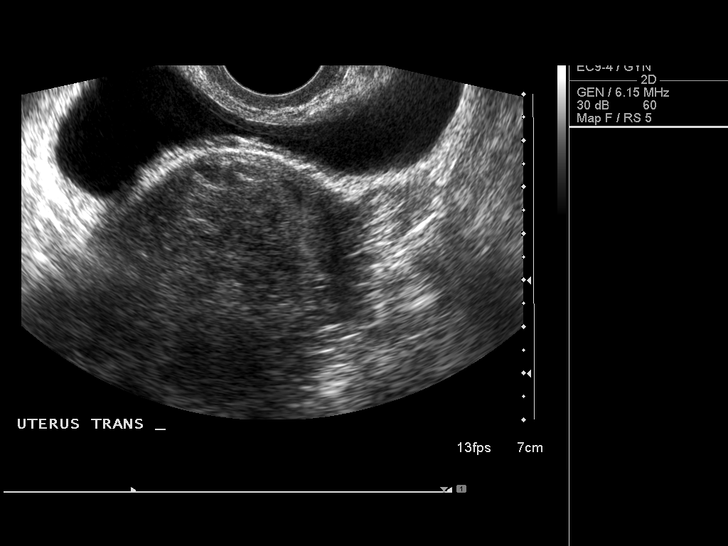
[im 40/74]
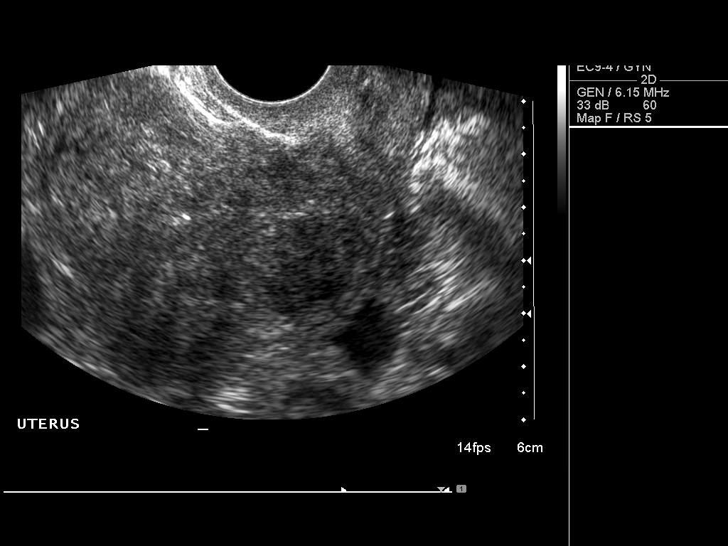
[im 46/74]
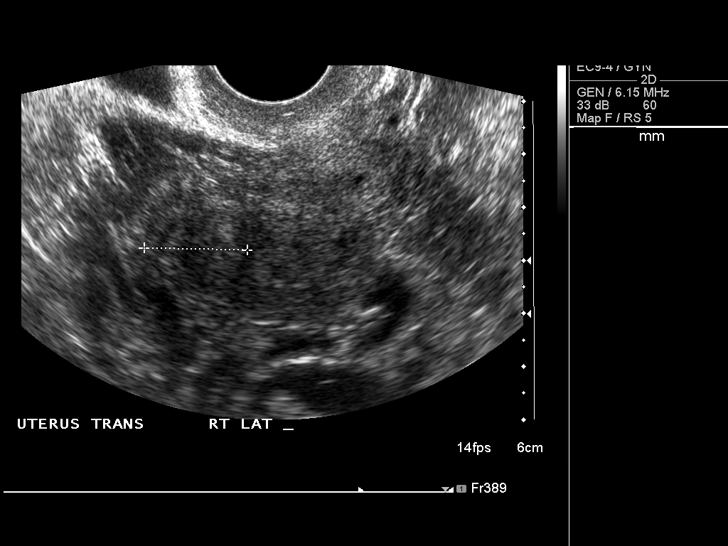
[im 49/74]
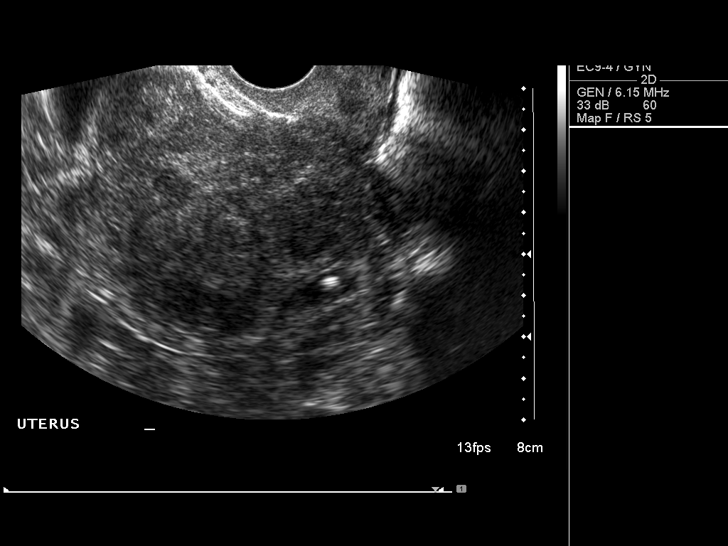
[im 55/74]
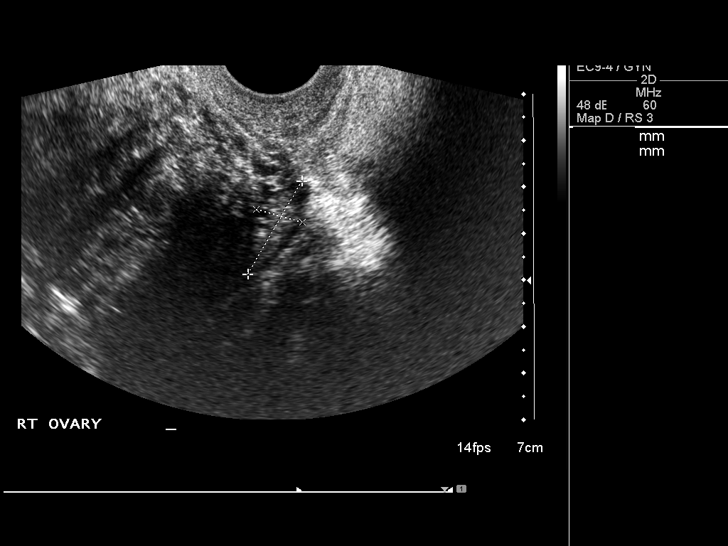
[im 61/74]
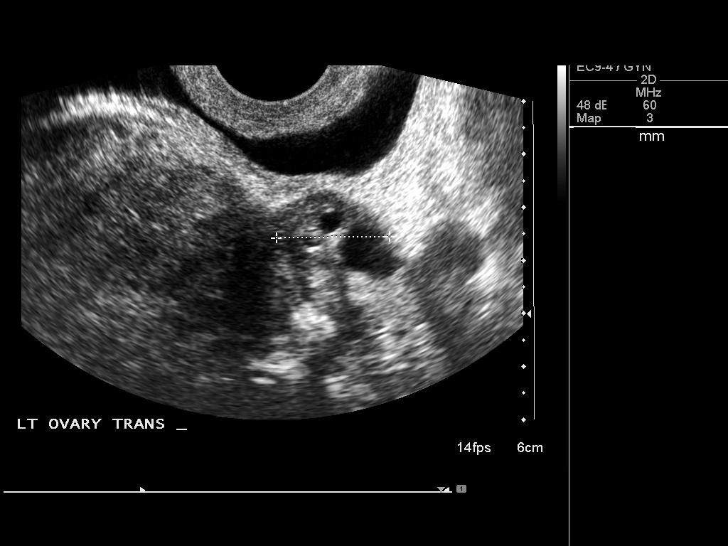
[im 67/74]
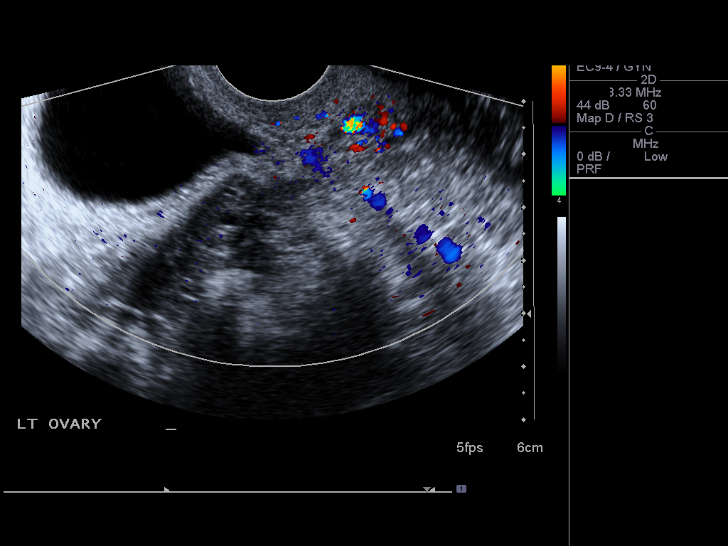
[im 74/74]
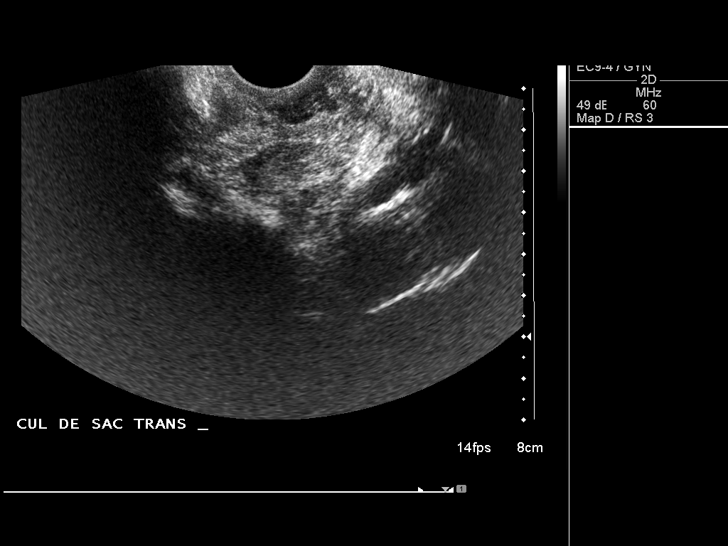

[14 of 25 positions shown; findings below may reference images not displayed]

FINDINGS: Uterus

Measurements: 10.9 x 5.3 x 6.2 cm. Multiple fibroids are identified.
Within the posterior myometrium there are 2 subserosal fibroids. The
largest measures 3.6 x 3.5 x 3.4 cm. Within the anterior myometrium
there is a subserosal fibroid measuring 3.4 x 2.5 x 3.2 cm. Small
sub mucosal fibroid within the uterine fundus measures 9 mm.

Endometrium

Thickness: 1.9 mm..  No focal abnormality visualized.

Right ovary

Measurements: 2.3 x 1.0 x 1.9 cm. Normal appearance/no adnexal mass.

Left ovary

Measurements: 2.8 x 2.2 x 2.1 cm.. Normal appearance/no adnexal
mass.

Other findings

No free fluid.
IMPRESSION: 1. Fibroid uterus.
2. No acute findings.

## 2015-02-23 IMAGING — US US ABDOMEN COMPLETE
1 series · 14 of 25 positions shown · non-contrast
Comparison: None.

CLINICAL DATA: Renal cyst.

EXAM:
ULTRASOUND ABDOMEN COMPLETE

[Series 1: us abdomen complete · 0.24mm/px · 14 of 84 slices shown]
[im 1/84]
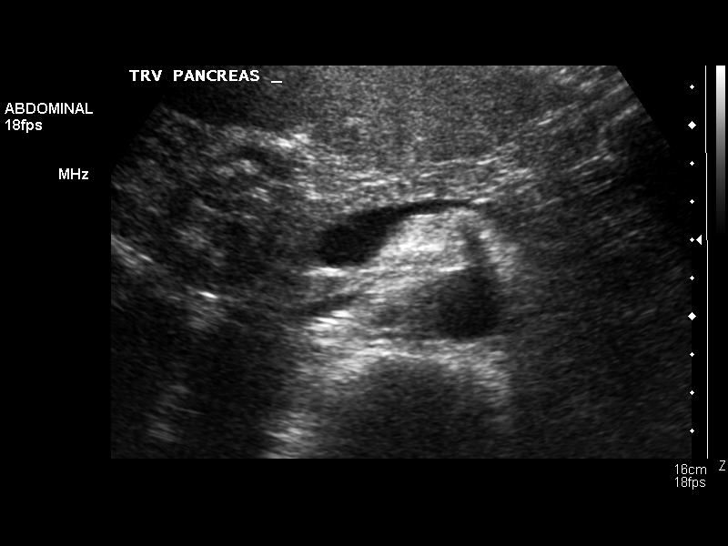
[im 7/84]
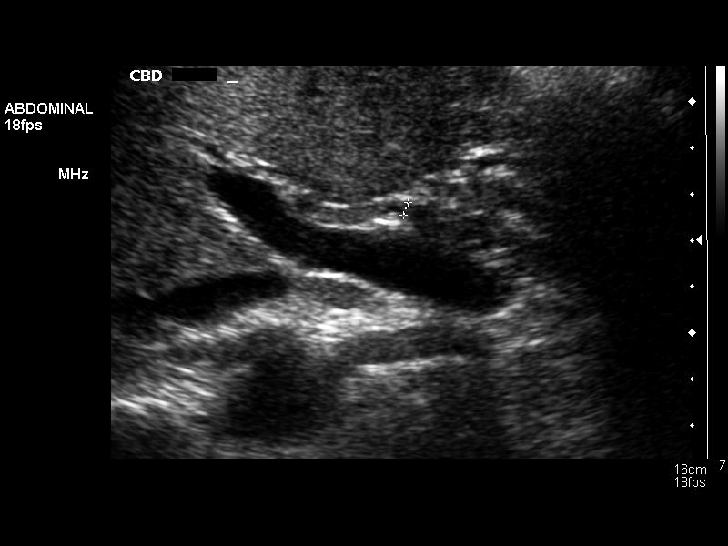
[im 14/84]
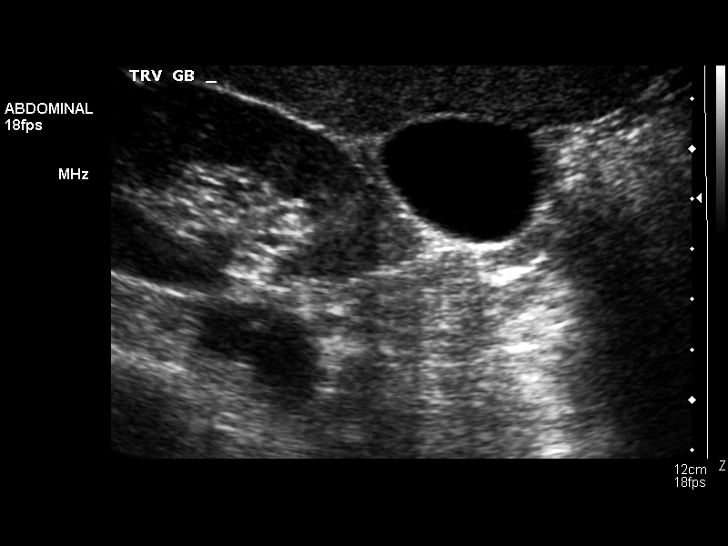
[im 21/84]
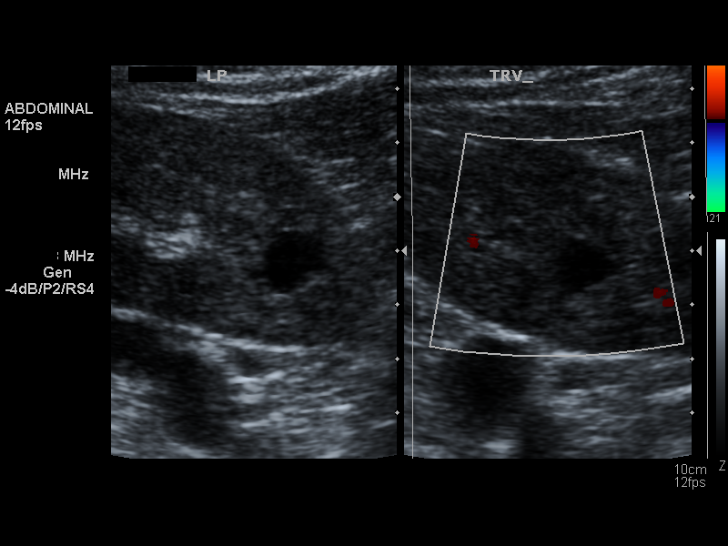
[im 28/84]
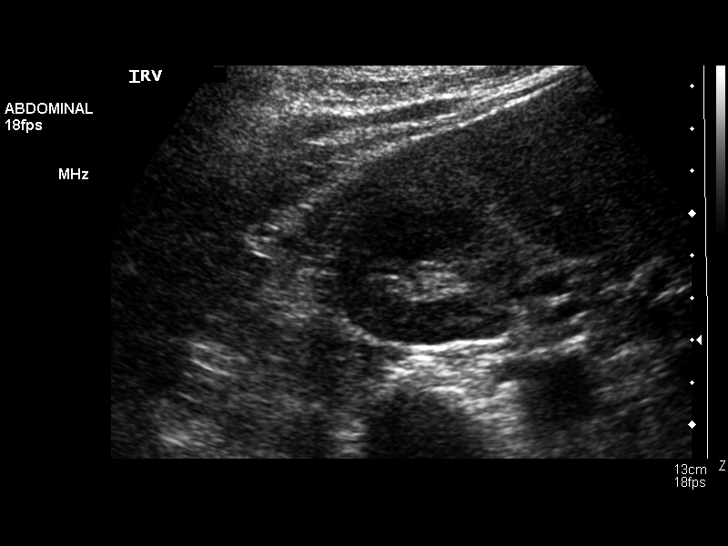
[im 32/84]
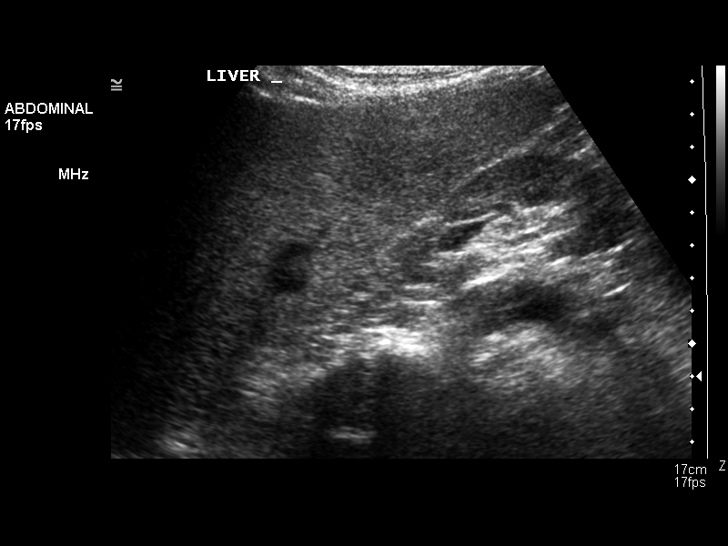
[im 39/84]
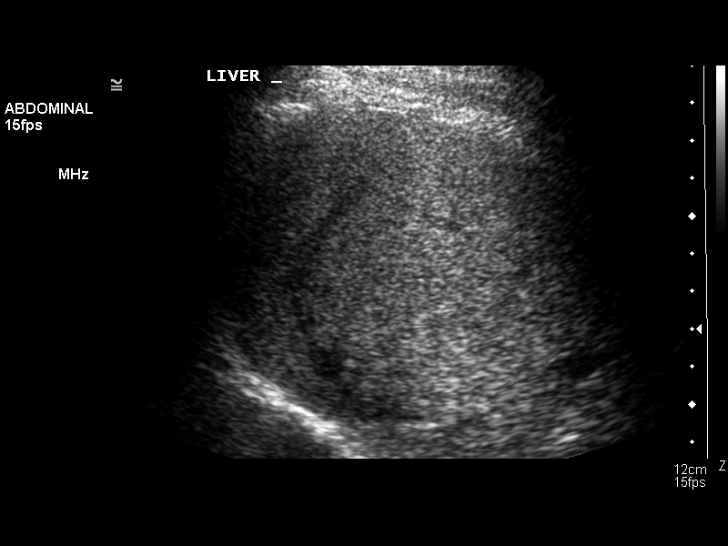
[im 45/84]
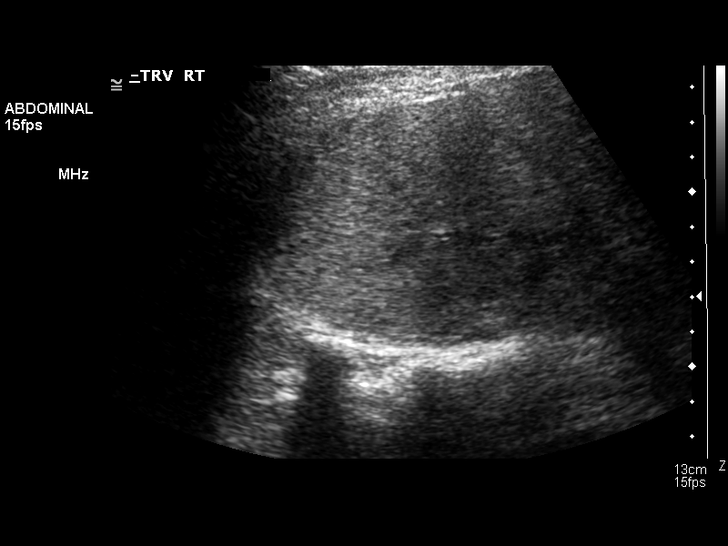
[im 52/84]
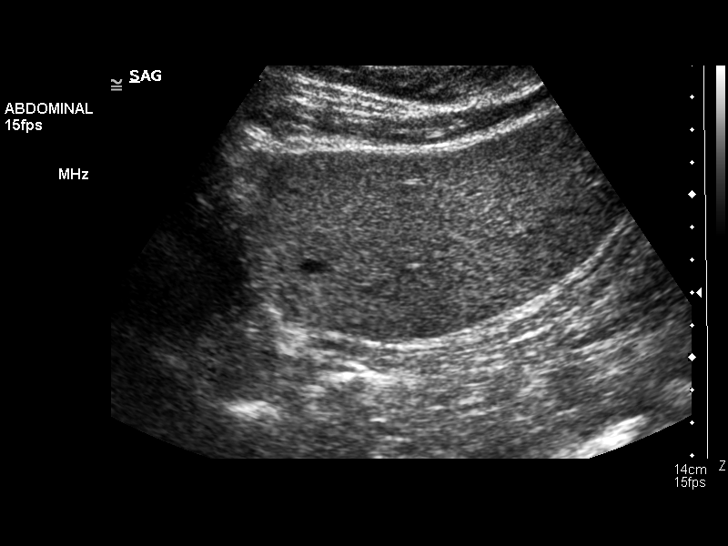
[im 56/84]
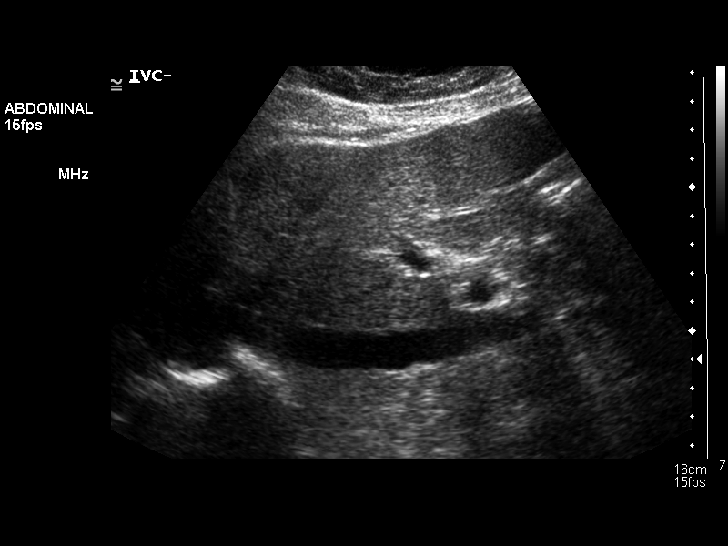
[im 63/84]
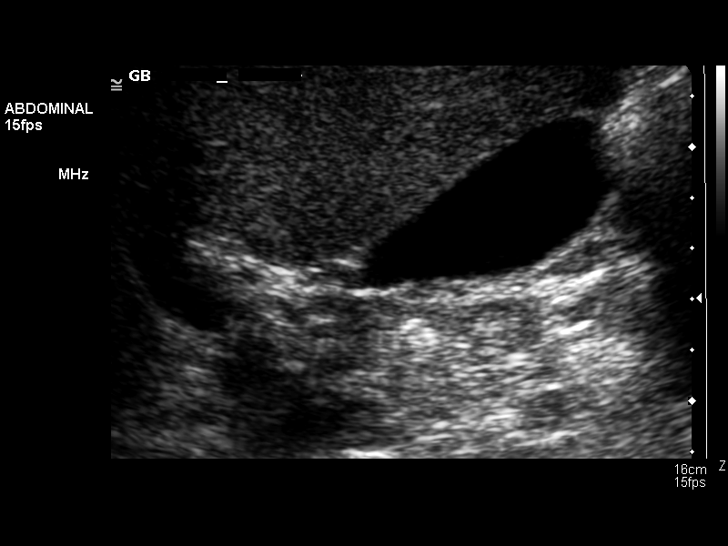
[im 70/84]
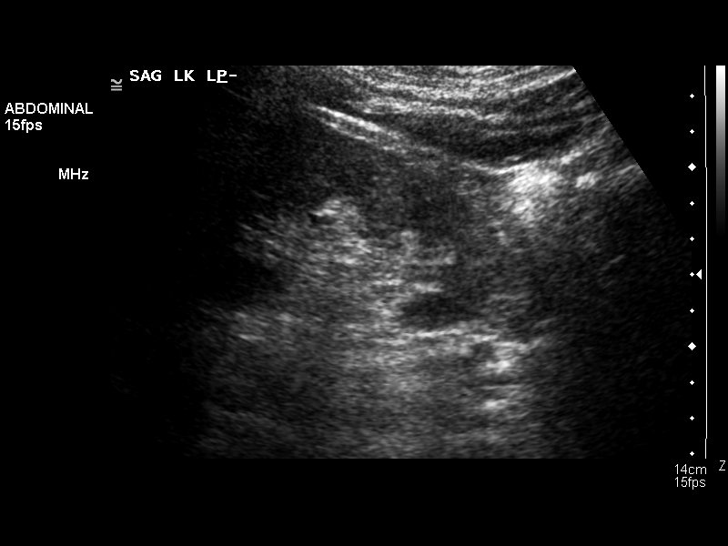
[im 77/84]
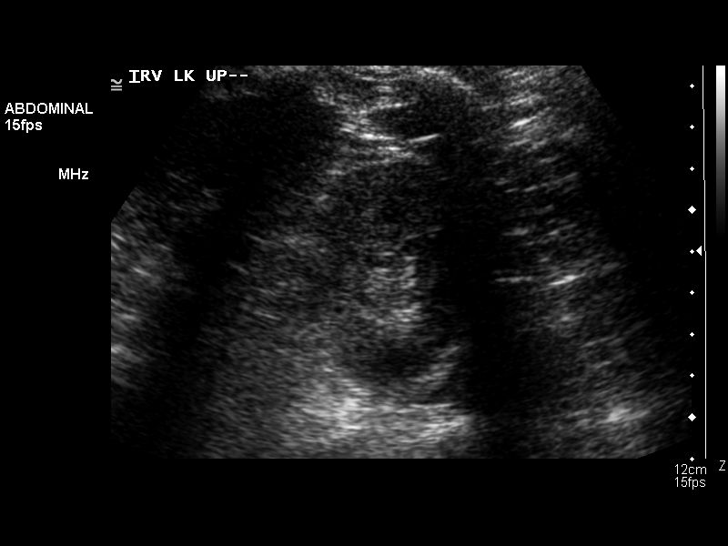
[im 84/84]
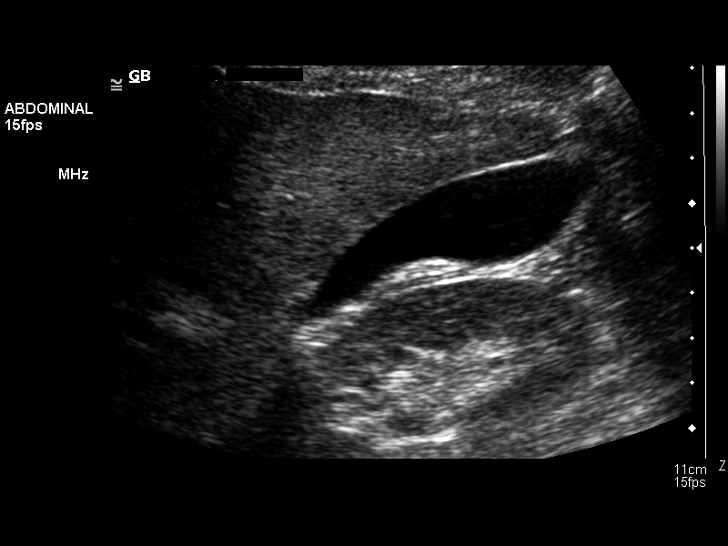

[14 of 25 positions shown; findings below may reference images not displayed]

FINDINGS: Gallbladder:

No gallstones or wall thickening visualized. No sonographic Murphy
sign noted.

Common bile duct:

Diameter: Measures 4.3 mm which is within normal limits.

Liver:

No focal lesion identified. Slightly increased echogenicity is noted
suggesting fatty infiltration.

IVC:

No abnormality visualized.

Pancreas:

Visualized portion unremarkable.

Spleen:

Size and appearance within normal limits.

Right Kidney:

Length: 10.3 cm. 1.5 cm cyst is seen laterally. 1.2 cm cyst is seen
in lower pole. Echogenicity within normal limits. No mass or
hydronephrosis visualized.

Left Kidney:

Length: 10.6 cm. 1.8 cm cyst is seen in lateral portion.
Echogenicity within normal limits. No mass or hydronephrosis
visualized.

Abdominal aorta:

No aneurysm visualized.

Other findings:

None.
IMPRESSION: Bilateral simple renal cysts are noted. Probable fatty infiltration
of the liver.

## 2015-06-22 ENCOUNTER — Other Ambulatory Visit: Payer: Self-pay

## 2015-06-22 MED ORDER — LISINOPRIL 20 MG PO TABS: 20.0000 mg | ORAL_TABLET | Freq: Every day | ORAL | Status: DC

## 2015-06-22 MED ORDER — AMLODIPINE BESYLATE 10 MG PO TABS: 10.0000 mg | ORAL_TABLET | Freq: Every day | ORAL | Status: DC

## 2015-06-29 ENCOUNTER — Other Ambulatory Visit: Payer: Self-pay

## 2015-06-29 MED ORDER — LISINOPRIL 20 MG PO TABS: 20.0000 mg | ORAL_TABLET | Freq: Every day | ORAL | Status: DC

## 2015-06-29 MED ORDER — AMLODIPINE BESYLATE 10 MG PO TABS: 10.0000 mg | ORAL_TABLET | Freq: Every day | ORAL | Status: DC

## 2015-07-01 ENCOUNTER — Ambulatory Visit (INDEPENDENT_AMBULATORY_CARE_PROVIDER_SITE_OTHER): Admitting: Emergency Medicine

## 2015-07-01 VITALS — BP 160/90 | HR 73 | Temp 98.9°F | Resp 16 | Ht 67.0 in | Wt 188.0 lb

## 2015-07-01 DIAGNOSIS — I10 Essential (primary) hypertension: Secondary | ICD-10-CM

## 2015-07-01 DIAGNOSIS — Z111 Encounter for screening for respiratory tuberculosis: Secondary | ICD-10-CM | POA: Diagnosis not present

## 2015-07-01 NOTE — Progress Notes (Addendum)
By signing my name below, I, Moises Blood, attest that this documentation has been prepared under the direction and in the presence of Arlyss Queen, MD. Electronically Signed: Moises Blood, [place]. 07/01/2015 , 1:46 PM .  Patient was seen in room 9 .  Chief Complaint:  Chief Complaint  Patient presents with  . PPD Placement    Needs TB test and shot records    HPI: Emily Lopez is a 60 y.o. female who reports to [facility] today for TB test and immunization records. She is also lining up for a physical exam. She was recommended a flu shot today.   BP Her BP is elevated today. She usually takes her lisinopril but frequently forgets her norvasc.   Personal She's currently working as a Surveyor, minerals, caring for 2 m.o's.   Past Medical History  Diagnosis Date  . Hypertension   . Pneumonia     2008 or 2009  . Depression   . Anxiety   . Arthritis   . Iron deficiency anemia, unspecified   . Attention deficit disorder without mention of hyperactivity   . Esophageal reflux   . Myalgia and myositis, unspecified   . Undiagnosed cardiac murmurs   . Unspecified vitamin D deficiency    History reviewed. No pertinent past surgical history. Social History   Social History  . Marital Status: Married    Spouse Name: N/A  . Number of Children: N/A  . Years of Education: N/A   Social History Main Topics  . Smoking status: Never Smoker   . Smokeless tobacco: Former Systems developer    Types: Snuff    Quit date: 07/24/2004  . Alcohol Use: 0.0 oz/week    0 Standard drinks or equivalent per week     Comment: 2 drinks  . Drug Use: No  . Sexual Activity: No   Other Topics Concern  . None   Social History Narrative   Family History  Problem Relation Age of Onset  . Hypertension Mother   . Diabetes Mother   . Arthritis Mother   . Hyperlipidemia Mother   . Hypertension Sister   . Lung cancer Father   . Heart attack Father   . Diabetes Brother    Allergies  Allergen Reactions  . No Known  Allergies    Prior to Admission medications   Medication Sig Start Date End Date Taking? Authorizing Provider  amLODipine (NORVASC) 10 MG tablet Take 1 tablet (10 mg total) by mouth daily. 06/29/15  Yes Wardell Honour, MD  lisinopril (PRINIVIL,ZESTRIL) 20 MG tablet Take 1 tablet (20 mg total) by mouth daily. 06/29/15  Yes Wardell Honour, MD  Multiple Vitamin (MULTIVITAMIN) LIQD Take 5 mLs by mouth daily. [place]  Nutri burst Liquid vitamin   Yes Historical Provider, MD     ROS:  Constitutional: negative for fever, chills, night sweats, weight changes, or fatigue  HEENT: negative for vision changes, hearing loss, congestion, rhinorrhea, ST, epistaxis, or sinus pressure Cardiovascular: negative for chest pain or palpitations Respiratory: negative for hemoptysis, wheezing, shortness of breath, or cough Abdominal: negative for abdominal pain, nausea, vomiting, diarrhea, or constipation Dermatological: negative for rash Neurologic: negative for headache, dizziness, or syncope All other systems reviewed and are otherwise negative with the exception to those above and in the HPI.  PHYSICAL EXAM: Filed Vitals:   07/01/15 1337  BP: 160/90  Pulse: 73  Temp: 98.9 F (37.2 C)  Resp: 16   Body mass index is 29.44 kg/(m^2).  General: Alert, no acute distress HEENT:  Normocephalic, atraumatic, oropharynx patent. Eye: [facility] Cardiovascular:  Regular rate and rhythm, no rubs murmurs or gallops.  No Carotid bruits, radial pulse intact. No pedal edema.  Respiratory: Clear to auscultation bilaterally.  No wheezes, rales, or rhonchi.  No cyanosis, no use of accessory musculature Abdominal: No organomegaly, abdomen is soft and non-tender, positive bowel sounds. No masses. Musculoskeletal: Gait intact. No edema, tenderness Skin: No rashes. Neurologic: Facial musculature symmetric. Psychiatric: Patient acts appropriately throughout our interaction.  Lymphatic: No cervical or submandibular  lymphadenopathy Genitourinary/Anorectal: No acute findings  BP recheck in room, sitting (left arm): 160/100  LABS:   EKG/XRAY:   Primary read interpreted by Dr. Everlene Farrier at [facility].   ASSESSMENT/PLAN: She states she usually does take her lisinopril but frequently does not take her Norvasc. She was advised about her elevated blood pressure she was given TB test today. She is up-to-date on tetanus. She refused  a flu shot.I personally performed the services described in this documentation, which was scribed in my presence. The recorded information has been reviewed and is accurate. She scheduled a complete physical.I personally performed the services described in this documentation, which was scribed in my presence. The recorded information has been reviewed and is accurate.   Gross sideeffects, risk and benefits, and alternatives of medications d/w patient. Patient is aware that all medications have potential sideeffects and we are unable to predict every sideeffect or drug-drug interaction that may occur.  Arlyss Queen MD 07/01/2015 1:46 PM

## 2015-07-01 NOTE — Patient Instructions (Signed)
DASH Eating Plan  DASH stands for "Dietary Approaches to Stop Hypertension." The DASH eating plan is a healthy eating plan that has been shown to reduce high blood pressure (hypertension). Additional health benefits may include reducing the risk of type 2 diabetes mellitus, heart disease, and stroke. The DASH eating plan may also help with weight loss.  WHAT DO I NEED TO KNOW ABOUT THE DASH EATING PLAN?  For the DASH eating plan, you will follow these general guidelines:  · Choose foods with a percent daily value for sodium of less than 5% (as listed on the food label).  · Use salt-free seasonings or herbs instead of table salt or sea salt.  · Check with your health care provider or pharmacist before using salt substitutes.  · Eat lower-sodium products, often labeled as "lower sodium" or "no salt added."  · Eat fresh foods.  · Eat more vegetables, fruits, and low-fat dairy products.  · Choose whole grains. Look for the word "whole" as the first word in the ingredient list.  · Choose fish and skinless chicken or turkey more often than red meat. Limit fish, poultry, and meat to 6 oz (170 g) each day.  · Limit sweets, desserts, sugars, and sugary drinks.  · Choose heart-healthy fats.  · Limit cheese to 1 oz (28 g) per day.  · Eat more home-cooked food and less restaurant, buffet, and fast food.  · Limit fried foods.  · Cook foods using methods other than frying.  · Limit canned vegetables. If you do use them, rinse them well to decrease the sodium.  · When eating at a restaurant, ask that your food be prepared with less salt, or no salt if possible.  WHAT FOODS CAN I EAT?  Seek help from a dietitian for individual calorie needs.  Grains  Whole grain or whole wheat bread. Brown rice. Whole grain or whole wheat pasta. Quinoa, bulgur, and whole grain cereals. Low-sodium cereals. Corn or whole wheat flour tortillas. Whole grain cornbread. Whole grain crackers. Low-sodium crackers.  Vegetables  Fresh or frozen vegetables  (raw, steamed, roasted, or grilled). Low-sodium or reduced-sodium tomato and vegetable juices. Low-sodium or reduced-sodium tomato sauce and paste. Low-sodium or reduced-sodium canned vegetables.   Fruits  All fresh, canned (in natural juice), or frozen fruits.  Meat and Other Protein Products  Ground beef (85% or leaner), grass-fed beef, or beef trimmed of fat. Skinless chicken or turkey. Ground chicken or turkey. Pork trimmed of fat. All fish and seafood. Eggs. Dried beans, peas, or lentils. Unsalted nuts and seeds. Unsalted canned beans.  Dairy  Low-fat dairy products, such as skim or 1% milk, 2% or reduced-fat cheeses, low-fat ricotta or cottage cheese, or plain low-fat yogurt. Low-sodium or reduced-sodium cheeses.  Fats and Oils  Tub margarines without trans fats. Light or reduced-fat mayonnaise and salad dressings (reduced sodium). Avocado. Safflower, olive, or canola oils. Natural peanut or almond butter.  Other  Unsalted popcorn and pretzels.  The items listed above may not be a complete list of recommended foods or beverages. Contact your dietitian for more options.  WHAT FOODS ARE NOT RECOMMENDED?  Grains  White bread. White pasta. White rice. Refined cornbread. Bagels and croissants. Crackers that contain trans fat.  Vegetables  Creamed or fried vegetables. Vegetables in a cheese sauce. Regular canned vegetables. Regular canned tomato sauce and paste. Regular tomato and vegetable juices.  Fruits  Dried fruits. Canned fruit in light or heavy syrup. Fruit juice.  Meat and Other Protein   Products  Fatty cuts of meat. Ribs, chicken wings, bacon, sausage, bologna, salami, chitterlings, fatback, hot dogs, bratwurst, and packaged luncheon meats. Salted nuts and seeds. Canned beans with salt.  Dairy  Whole or 2% milk, cream, half-and-half, and cream cheese. Whole-fat or sweetened yogurt. Full-fat cheeses or blue cheese. Nondairy creamers and whipped toppings. Processed cheese, cheese spreads, or cheese  curds.  Condiments  Onion and garlic salt, seasoned salt, table salt, and sea salt. Canned and packaged gravies. Worcestershire sauce. Tartar sauce. Barbecue sauce. Teriyaki sauce. Soy sauce, including reduced sodium. Steak sauce. Fish sauce. Oyster sauce. Cocktail sauce. Horseradish. Ketchup and mustard. Meat flavorings and tenderizers. Bouillon cubes. Hot sauce. Tabasco sauce. Marinades. Taco seasonings. Relishes.  Fats and Oils  Butter, stick margarine, lard, shortening, ghee, and bacon fat. Coconut, palm kernel, or palm oils. Regular salad dressings.  Other  Pickles and olives. Salted popcorn and pretzels.  The items listed above may not be a complete list of foods and beverages to avoid. Contact your dietitian for more information.  WHERE CAN I FIND MORE INFORMATION?  National Heart, Lung, and Blood Institute: www.nhlbi.nih.gov/health/health-topics/topics/dash/     This information is not intended to replace advice given to you by your health care provider. Make sure you discuss any questions you have with your health care provider.     Document Released: 05/16/2011 Document Revised: 06/17/2014 Document Reviewed: 03/31/2013  Elsevier Interactive Patient Education ©2016 [employer]

## 2015-07-04 ENCOUNTER — Ambulatory Visit (INDEPENDENT_AMBULATORY_CARE_PROVIDER_SITE_OTHER)

## 2015-07-04 DIAGNOSIS — Z111 Encounter for screening for respiratory tuberculosis: Secondary | ICD-10-CM

## 2015-07-04 LAB — TB SKIN TEST
Induration: 0 mm
TB SKIN TEST: NEGATIVE

## 2015-07-04 NOTE — Progress Notes (Signed)
Pt came to have PPD read. PPD negative. 72mm induration

## 2015-07-11 ENCOUNTER — Encounter: Admitting: Physician Assistant

## 2015-09-04 ENCOUNTER — Ambulatory Visit (INDEPENDENT_AMBULATORY_CARE_PROVIDER_SITE_OTHER): Admitting: Physician Assistant

## 2015-09-04 VITALS — BP 122/68 | HR 71 | Temp 99.0°F | Resp 16 | Ht 66.0 in | Wt 181.0 lb

## 2015-09-04 DIAGNOSIS — R059 Cough, unspecified: Secondary | ICD-10-CM

## 2015-09-04 DIAGNOSIS — B349 Viral infection, unspecified: Secondary | ICD-10-CM

## 2015-09-04 DIAGNOSIS — R05 Cough: Secondary | ICD-10-CM

## 2015-09-04 DIAGNOSIS — I10 Essential (primary) hypertension: Secondary | ICD-10-CM | POA: Diagnosis not present

## 2015-09-04 LAB — COMPLETE METABOLIC PANEL WITH GFR
ALBUMIN: 4.3 g/dL (ref 3.6–5.1)
ALK PHOS: 52 U/L (ref 33–130)
ALT: 16 U/L (ref 6–29)
AST: 31 U/L (ref 10–35)
BUN: 34 mg/dL — AB (ref 7–25)
CALCIUM: 9.5 mg/dL (ref 8.6–10.4)
CO2: 25 mmol/L (ref 20–31)
CREATININE: 1.97 mg/dL — AB (ref 0.50–1.05)
Chloride: 100 mmol/L (ref 98–110)
GFR, Est African American: 31 mL/min — ABNORMAL LOW (ref >=60)
GFR, Est Non African American: 27 mL/min — ABNORMAL LOW (ref >=60)
Glucose, Bld: 100 mg/dL — ABNORMAL HIGH (ref 65–99)
Potassium: 3.6 mmol/L (ref 3.5–5.3)
Sodium: 140 mmol/L (ref 135–146)
TOTAL PROTEIN: 7.7 g/dL (ref 6.1–8.1)
Total Bilirubin: 0.6 mg/dL (ref 0.2–1.2)

## 2015-09-04 LAB — CBC
HEMATOCRIT: 34.5 % — AB (ref 36.0–46.0)
HEMOGLOBIN: 11.9 g/dL — AB (ref 12.0–15.0)
MCH: 26.4 pg (ref 26.0–34.0)
MCHC: 34.5 g/dL (ref 30.0–36.0)
MCV: 76.7 fL — ABNORMAL LOW (ref 78.0–100.0)
MPV: 9.5 fL (ref 8.6–12.4)
Platelets: 234 10*3/uL (ref 150–400)
RBC: 4.5 MIL/uL (ref 3.87–5.11)
RDW: 15.7 % — ABNORMAL HIGH (ref 11.5–15.5)
WBC: 6.7 10*3/uL (ref 4.0–10.5)

## 2015-09-04 MED ORDER — BENZONATATE 100 MG PO CAPS: 100.0000 mg | ORAL_CAPSULE | Freq: Three times a day (TID) | ORAL | Status: DC | PRN

## 2015-09-04 NOTE — Patient Instructions (Addendum)
  IF you received an x-ray today, you will receive an invoice from [facility] Radiology. Please contact [facility]iology at [phone] with questions or concerns regarding your invoice.   IF you received labwork today, you will receive an invoice from [employer]. Please contact [facility] at [phone] with questions or concerns regarding your invoice.   Our billing staff will not be able to assist you with questions regarding bills from these companies.  You will be contacted with the lab results as soon as they are available. The fastest way to get your results is to activate your My Chart account. Instructions are located on the last page of this paperwork. If you have not heard from [place] regarding the results in 2 weeks, please contact this office.     Please hydrate well with water.   Take the tessalon pearls for cough.   You can take ibuprofen or tylenol for fever or pain. I would like you to contact me, by phone, if your symptoms do not improve by Thursday, or 3 days.

## 2015-09-04 NOTE — Progress Notes (Signed)
[facility] [address] [place] [address] [phone]  Date:  09/04/2015   Name:  Emily Lopez   DOB:  02/02/1956   MRN:  [id number]  PCP:  Kennon Portela, MD    History of Present Illness:  Emily Lopez is a 60 y.o. female patient who presents to [facility] for chief complaint of diarrhea and cough. 1st sxs of body aches, then emesis 5 days ago.  She started to feel better, but then had diarrhea.  She has not been able to tolerate foods.  No bloody emesis or stool.  Nasal congestion, coughing productive yellow sputum--moreso in the morning.  No sob or dyspnea.  The emesis has resolved.  Diarrhea has improved, however still present.  She has had no fever.  She was doing cna at a nursing home.    She also would like refill of her medication soon.  She is to have a physical but switched providers due to provider retirement.  She is compliant on anti-hypertensives. She denies chest pains, palpitations, leg swelling, shortness of breath, or dizziness.  Patient Active Problem List   Diagnosis Date Noted  . WRIST PAIN, LEFT 01/19/2010  . LEG PAIN, BILATERAL 01/19/2010  . MUSCLE PAIN 12/25/2009  . VITAMIN D DEFICIENCY 03/13/2009  . DEPRESSIVE DISORDER 08/24/2008  . OVERWEIGHT 07/25/2008  . ATTENTION DEFICIT DISORDER, ADULT 07/25/2008  . SYSTOLIC MURMUR 123456  . GERD 07/20/2007  . ANEMIA, IRON DEFICIENCY, UNSPEC. 08/07/2006  . HYPERTENSION, BENIGN SYSTEMIC 08/07/2006    Past Medical History  Diagnosis Date  . Hypertension   . Pneumonia     2008 or 2009  . Depression   . Anxiety   . Arthritis   . Iron deficiency anemia, unspecified   . Attention deficit disorder without mention of hyperactivity   . Esophageal reflux   . Myalgia and myositis, unspecified   . Undiagnosed cardiac murmurs   . Unspecified vitamin D deficiency     History reviewed. No pertinent past surgical history.  Social History  Substance Use Topics  . Smoking status:  Never Smoker   . Smokeless tobacco: Former Systems developer    Types: Snuff    Quit date: 07/24/2004  . Alcohol Use: 0.0 oz/week    0 Standard drinks or equivalent per week     Comment: 2 drinks    Family History  Problem Relation Age of Onset  . Hypertension Mother   . Diabetes Mother   . Arthritis Mother   . Hyperlipidemia Mother   . Hypertension Sister   . Lung cancer Father   . Heart attack Father   . Diabetes Brother     Allergies  Allergen Reactions  . No Known Allergies     Medication list has been reviewed and updated.  Current Outpatient Prescriptions on File Prior to Visit  Medication Sig Dispense Refill  . amLODipine (NORVASC) 10 MG tablet Take 1 tablet (10 mg total) by mouth daily. 30 tablet 0  . lisinopril (PRINIVIL,ZESTRIL) 20 MG tablet Take 1 tablet (20 mg total) by mouth daily. 30 tablet 0  . Multiple Vitamin (MULTIVITAMIN) LIQD Take 5 mLs by mouth daily. Iaso  Nutri burst Liquid vitamin     No current facility-administered medications on file prior to visit.    ROS ROS otherwise unremarkable unless listed above.  Physical Examination: BP 122/68 mmHg  Pulse 71  Temp(Src) 99 F (37.2 C)  Resp 16  Ht 5\' 6"  (1.676 m)  Wt 181  lb (82.101 kg)  BMI 29.23 kg/m2  SpO2 94% Ideal Body Weight: Weight in (lb) to have BMI = 25: 154.6  Physical Exam  Constitutional: She is oriented to person, place, and time. She appears well-developed and well-nourished. No distress.  HENT:  Head: Normocephalic and atraumatic.  Right Ear: Tympanic membrane, external ear and ear canal normal.  Left Ear: Tympanic membrane, external ear and ear canal normal.  Nose: Mucosal edema and rhinorrhea present. Right sinus exhibits no maxillary sinus tenderness and no frontal sinus tenderness. Left sinus exhibits no maxillary sinus tenderness and no frontal sinus tenderness.  Mouth/Throat: No uvula swelling. No oropharyngeal exudate, posterior oropharyngeal edema or posterior oropharyngeal  erythema.  Eyes: Conjunctivae and EOM are normal. Pupils are equal, round, and reactive to light.  Cardiovascular: Normal rate and regular rhythm.  Exam reveals no gallop, no distant heart sounds and no friction rub.   No murmur heard. Pulmonary/Chest: Effort normal. No respiratory distress. She has no decreased breath sounds. She has no wheezes. She has no rhonchi.  Lymphadenopathy:       Head (right side): No submandibular, no tonsillar, no preauricular and no posterior auricular adenopathy present.       Head (left side): No submandibular, no tonsillar, no preauricular and no posterior auricular adenopathy present.  Neurological: She is alert and oriented to person, place, and time.  Skin: She is not diaphoretic.  Psychiatric: She has a normal mood and affect. Her behavior is normal.     Assessment and Plan: Emily Lopez is a 60 y.o. female who is here today for diarrhea and cough. This appears to be viral.  Possibly influenza.  We will treat this supportively at this time.   Viral infection  Cough - Plan: benzonatate (TESSALON) 100 MG capsule  Essential hypertension - Plan: CBC, COMPLETE METABOLIC PANEL WITH GFR    Ivar Drape, PA-C [facility] 09/04/2015 10:23 AM

## 2015-10-06 ENCOUNTER — Telehealth: Payer: Self-pay

## 2015-10-06 NOTE — Telephone Encounter (Signed)
LMOVM for pt to return call to clinical TL per Stephanie's message.

## 2015-10-07 ENCOUNTER — Telehealth: Payer: Self-pay

## 2015-10-07 NOTE — Telephone Encounter (Signed)
-----   Message from Joretta Bachelor, [place] sent at 10/05/2015  9:18 AM EDT ----- Please advise that I would like her to return within the next 2 weeks for follow up of her blood work.  I would like to check her labs such as a BMET and cbc, at this time.  She has a hx of kidney decline and we should monitor this.

## 2015-10-07 NOTE — Telephone Encounter (Signed)
IC pt with message from [place]. My statement to her was the reason she wanted a follow up on the labs was due to her history of kidney decline. Pt was alarmed - stated she was never told she had a history of any kidney issues or kidney decline, not diabetic.  Advised pt to stay hydrated with water, not tea/coffee/sodas. Pt not on NSAIDS per med list. Pt agreed and will come in during next 2 weeks.

## 2015-10-12 ENCOUNTER — Other Ambulatory Visit: Payer: Self-pay | Admitting: Family Medicine

## 2015-10-16 ENCOUNTER — Ambulatory Visit (INDEPENDENT_AMBULATORY_CARE_PROVIDER_SITE_OTHER): Admitting: Physician Assistant

## 2015-10-16 VITALS — BP 124/82 | HR 78 | Temp 98.3°F | Resp 18 | Ht 66.0 in | Wt 183.0 lb

## 2015-10-16 DIAGNOSIS — I10 Essential (primary) hypertension: Secondary | ICD-10-CM

## 2015-10-16 DIAGNOSIS — E119 Type 2 diabetes mellitus without complications: Secondary | ICD-10-CM

## 2015-10-16 DIAGNOSIS — N289 Disorder of kidney and ureter, unspecified: Secondary | ICD-10-CM

## 2015-10-16 LAB — COMPREHENSIVE METABOLIC PANEL
ALT: 13 U/L (ref 6–29)
AST: 17 U/L (ref 10–35)
Albumin: 4.5 g/dL (ref 3.6–5.1)
Alkaline Phosphatase: 76 U/L (ref 33–130)
BUN: 13 mg/dL (ref 7–25)
CO2: 27 mmol/L (ref 20–31)
Calcium: 9.7 mg/dL (ref 8.6–10.4)
Chloride: 101 mmol/L (ref 98–110)
Creat: 0.82 mg/dL (ref 0.50–1.05)
Glucose, Bld: 126 mg/dL — ABNORMAL HIGH (ref 65–99)
POTASSIUM: 4 mmol/L (ref 3.5–5.3)
Sodium: 137 mmol/L (ref 135–146)
TOTAL PROTEIN: 7.7 g/dL (ref 6.1–8.1)
Total Bilirubin: 0.6 mg/dL (ref 0.2–1.2)

## 2015-10-16 LAB — CBC
HCT: 36.5 % (ref 35.0–45.0)
Hemoglobin: 12 g/dL (ref 11.7–15.5)
MCH: 26.3 pg — AB (ref 27.0–33.0)
MCHC: 32.9 g/dL (ref 32.0–36.0)
MCV: 80 fL (ref 80.0–100.0)
MPV: 9.3 fL (ref 7.5–12.5)
PLATELETS: 313 10*3/uL (ref 140–400)
RBC: 4.56 MIL/uL (ref 3.80–5.10)
RDW: 17 % — AB (ref 11.0–15.0)
WBC: 5.3 10*3/uL (ref 3.8–10.8)

## 2015-10-16 LAB — HEMOGLOBIN A1C
HEMOGLOBIN A1C: 6.6 % — AB (ref <5.7)
Mean Plasma Glucose: 143 mg/dL

## 2015-10-16 MED ORDER — LISINOPRIL 20 MG PO TABS: 20.0000 mg | ORAL_TABLET | Freq: Every day | ORAL | Status: DC

## 2015-10-16 NOTE — Patient Instructions (Addendum)
I will call you about your lab results Return in 6 months for follow up or sooner if needed. Next time come fasting.    IF you received an x-ray today, you will receive an invoice from [facility] Radiology. Please contact [facility] Radiology at [phone] with questions or concerns regarding your invoice.   IF you received labwork today, you will receive an invoice from [employer]. Please contact [facility] at [phone] with questions or concerns regarding your invoice.   Our billing staff will not be able to assist you with questions regarding bills from these companies.  You will be contacted with the lab results as soon as they are available. The fastest way to get your results is to activate your My Chart account. Instructions are located on the last page of this paperwork. If you have not heard from [place] regarding the results in 2 weeks, please contact this office.

## 2015-10-16 NOTE — Progress Notes (Signed)
[facility] [address], [place] [address] [phone]  Date:  10/16/2015   Name:  Emily Lopez   DOB:  [birth year]   MRN:  [id number]  PCP:  Kennon Portela, MD    Chief Complaint: Medication Refill and bloodwork   History of Present Illness:  This is a 60 y.o. female with PMH HTN, anemia, vit D def who is presenting for refills of lisinopril. She takes 20 mg and amlodipine 10 mg QD for HTN. States sometimes we prescribe and sometimes gets sent through mail through [facility]. Denies CP, SOB.   Last time she was here she had decreased kidney function on cmet. BUN 34 and serum creatinine 1.97. This was abnormal for her. She was advised to return for repeat blood test and avoid nsaids in the meantime.  Last lipid 2015, was normal.  Review of Systems:  Review of Systems See HPI  Patient Active Problem List   Diagnosis Date Noted  . VITAMIN D DEFICIENCY 03/13/2009  . DEPRESSIVE DISORDER 08/24/2008  . OVERWEIGHT 07/25/2008  . ATTENTION DEFICIT DISORDER, ADULT 07/25/2008  . SYSTOLIC MURMUR 123456  . GERD 07/20/2007  . ANEMIA, IRON DEFICIENCY, UNSPEC. 08/07/2006  . HYPERTENSION, BENIGN SYSTEMIC 08/07/2006    Prior to Admission medications   Medication Sig Start Date End Date Taking? Authorizing Provider  amLODipine (NORVASC) 10 MG tablet Take 1 tablet (10 mg total) by mouth daily. 06/29/15  Yes Wardell Honour, MD  benzonatate (TESSALON) 100 MG capsule Take 1-2 capsules (100-200 mg total) by mouth 3 (three) times daily as needed for cough. 09/04/15  Yes Stephanie D English, PA  lisinopril (PRINIVIL,ZESTRIL) 20 MG tablet Take 1 tablet (20 mg total) by mouth daily. 06/29/15  Yes Wardell Honour, MD  Multiple Vitamin (MULTIVITAMIN) LIQD Take 5 mLs by mouth daily. Iaso  Nutri burst Liquid vitamin   Yes Historical Provider, MD    Allergies  Allergen Reactions  . No Known Allergies     History reviewed. No pertinent past surgical history.  Social  History  Substance Use Topics  . Smoking status: Never Smoker   . Smokeless tobacco: Former Systems developer    Types: Snuff    Quit date: 07/24/2004  . Alcohol Use: 0.0 oz/week    0 Standard drinks or equivalent per week     Comment: 2 drinks    Family History  Problem Relation Age of Onset  . Hypertension Mother   . Diabetes Mother   . Arthritis Mother   . Hyperlipidemia Mother   . Hypertension Sister   . Lung cancer Father   . Heart attack Father   . Diabetes Brother     Medication list has been reviewed and updated.  Physical Examination:  Physical Exam  Constitutional: She is oriented to person, place, and time. She appears well-developed and well-nourished. No distress.  HENT:  Head: Normocephalic and atraumatic.  Right Ear: Hearing, tympanic membrane, external ear and ear canal normal.  Left Ear: Hearing, tympanic membrane, external ear and ear canal normal.  Nose: Nose normal.  Mouth/Throat: Uvula is midline, oropharynx is clear and moist and mucous membranes are normal.  Eyes: Conjunctivae and lids are normal. Right eye exhibits no discharge. Left eye exhibits no discharge. No scleral icterus.  Neck: Trachea normal. Carotid bruit is not present. No thyromegaly present.  Cardiovascular: Normal rate, regular rhythm, normal heart sounds and normal pulses.   No murmur heard. Pulmonary/Chest: Effort normal and breath sounds normal. No respiratory  distress. She has no wheezes. She has no rhonchi. She has no rales.  Musculoskeletal: Normal range of motion.  Lymphadenopathy:       Head (right side): No submental, no submandibular and no tonsillar adenopathy present.       Head (left side): No submental, no submandibular and no tonsillar adenopathy present.    She has no cervical adenopathy.  Neurological: She is alert and oriented to person, place, and time.  Skin: Skin is warm, dry and intact. No lesion and no rash noted.  Psychiatric: She has a normal mood and affect. Her speech  is normal and behavior is normal. Thought content normal.   BP 124/82 mmHg  Pulse 78  Temp(Src) 98.3 F (36.8 C) (Oral)  Resp 18  Ht 5\' 6"  (1.676 m)  Wt 183 lb (83.008 kg)  BMI 29.55 kg/m2  SpO2 99%  Results for orders placed or performed in visit on 10/16/15  CBC  Result Value Ref Range   WBC 5.3 3.8 - 10.8 K/uL   RBC 4.56 3.80 - 5.10 MIL/uL   Hemoglobin 12.0 11.7 - 15.5 g/dL   HCT 36.5 35.0 - 45.0 %   MCV 80.0 80.0 - 100.0 fL   MCH 26.3 (L) 27.0 - 33.0 pg   MCHC 32.9 32.0 - 36.0 g/dL   RDW 17.0 (H) 11.0 - 15.0 %   Platelets 313 140 - 400 K/uL   MPV 9.3 7.5 - 12.5 fL  Comprehensive metabolic panel  Result Value Ref Range   Sodium 137 135 - 146 mmol/L   Potassium 4.0 3.5 - 5.3 mmol/L   Chloride 101 98 - 110 mmol/L   CO2 27 20 - 31 mmol/L   Glucose, Bld 126 (H) 65 - 99 mg/dL   BUN 13 7 - 25 mg/dL   Creat 0.82 0.50 - 1.05 mg/dL   Total Bilirubin 0.6 0.2 - 1.2 mg/dL   Alkaline Phosphatase 76 33 - 130 U/L   AST 17 10 - 35 U/L   ALT 13 6 - 29 U/L   Total Protein 7.7 6.1 - 8.1 g/dL   Albumin 4.5 3.6 - 5.1 g/dL   Calcium 9.7 8.6 - 10.4 mg/dL  Hemoglobin A1c  Result Value Ref Range   Hgb A1c MFr Bld 6.6 (H) <5.7 %   Mean Plasma Glucose 143 mg/dL    Assessment and Plan:  1. Function kidney decreased Kidney function back to baseline. - Comprehensive metabolic panel - Hemoglobin A1c  2. Essential hypertension Refilled.  - CBC - lisinopril (PRINIVIL,ZESTRIL) 20 MG tablet; Take 1 tablet (20 mg total) by mouth daily.  Dispense: 90 tablet; Refill: 1  3. Type 2 diabetes mellitus without complication, without long-term current use of insulin (HCC) A1C 6.6 -- increased from 5.6 two years ago. Counseled on diet, exercise, weight loss. Return in 3 months for follow up and repeat A1C. Next visit come fasting for lipid panel.   Benjaman Pott Drenda Freeze, MHS [facility]  10/18/2015

## 2016-09-20 ENCOUNTER — Ambulatory Visit (INDEPENDENT_AMBULATORY_CARE_PROVIDER_SITE_OTHER): Admitting: Physician Assistant

## 2016-09-20 VITALS — BP 120/74 | HR 76 | Temp 98.1°F | Resp 18 | Ht 66.0 in | Wt 195.0 lb

## 2016-09-20 DIAGNOSIS — R600 Localized edema: Secondary | ICD-10-CM

## 2016-09-20 MED ORDER — MEDICAL COMPRESSION STOCKINGS MISC: 1 refills | Status: DC

## 2016-09-20 MED ORDER — HYDROCHLOROTHIAZIDE 25 MG PO TABS: 12.5000 mg | ORAL_TABLET | Freq: Every day | ORAL | 0 refills | Status: DC | PRN

## 2016-09-20 NOTE — Progress Notes (Signed)
THIS NOTE IS USED FOR EDUCATIONAL PURPOSES ONLY!!!   Name: Emily Lopez  DOB: 01-04-1956  Age: 61 y.o. Sex: female  CC:  Chief Complaint  Patient presents with  . Edema    Right ankle  . Labs Only    Pt fasting    PCP: GUEST, Veneda Melter, MD  HPI: Patient reports chronic ankle pain x2 years with swelling at the ankle x2 weeks ago.   Patient reports the pain is a burning sensation. Constant pain. Patient reports that when she is on her feet a lot the pain gets worse. When she elevates the pain goes away. Has never used compression stockings. Patient reports that with elevation her swelling goes away. Denies cold extremities.   Patient reports getting new shoes and this has increased her pain from the burkinstocks that she had before. Denies PND. Sleeps on 1 pillow every night- no changes. Is able to lat flat without becoming SOB.   Denies SOB or chest pain. Denies numbness and tingling in the toes.   She reports decreased sodium in the last 2 weeks.     ROS:  Constitutional: Negative for activity change, appetite change, fatigue and unexpected weight change.  HENT: Negative for congestion, dental problem, ear pain, hearing loss, mouth sores, postnasal drip, rhinorrhea, sneezing, sore throat, tinnitus and trouble swallowing.   Eyes: Negative for photophobia, pain, redness and visual disturbance.  Respiratory: Negative for cough, chest tightness and shortness of breath.   Cardiovascular: Negative for chest pain, palpitations and leg swelling.  Gastrointestinal: Negative for abdominal pain, blood in stool, constipation, diarrhea, nausea and vomiting.  Genitourinary: Negative for dysuria, frequency, hematuria and urgency.  Musculoskeletal: Negative for arthralgias, gait problem, myalgias and neck stiffness.  Skin: Negative for rash.  Neurological: Negative for dizziness, speech difficulty, weakness, light-headedness, numbness and headaches.  Hematological: Negative for adenopathy.   Psychiatric/Behavioral: Negative for confusion and sleep disturbance. The patient is not nervous/anxious.    PMH:  Patient Active Problem List   Diagnosis Date Noted  . VITAMIN D DEFICIENCY 03/13/2009  . DEPRESSIVE DISORDER 08/24/2008  . OVERWEIGHT 07/25/2008  . ATTENTION DEFICIT DISORDER, ADULT 07/25/2008  . SYSTOLIC MURMUR 16/03/9603  . GERD 07/20/2007  . ANEMIA, IRON DEFICIENCY, UNSPEC. 08/07/2006  . HYPERTENSION, BENIGN SYSTEMIC 08/07/2006    Allergies:  Allergies  Allergen Reactions  . No Known Allergies     Medications:  Current Outpatient Prescriptions on File Prior to Visit  Medication Sig Dispense Refill  . amLODipine (NORVASC) 10 MG tablet Take 1 tablet (10 mg total) by mouth daily. 30 tablet 0  . lisinopril (PRINIVIL,ZESTRIL) 20 MG tablet Take 1 tablet (20 mg total) by mouth daily. 90 tablet 1  . Multiple Vitamin (MULTIVITAMIN) LIQD Take 5 mLs by mouth daily. Iaso  Nutri burst Liquid vitamin     No current facility-administered medications on file prior to visit.     PE:  GS: WDWN female sitting on exam table in NAD.  Vitals: BP (!) 147/84   Pulse 76   Temp 98.1 F (36.7 C) (Oral)   Resp 18   Ht 5\' 6"  (1.676 m)   Wt 195 lb (88.5 kg)   SpO2 97%   BMI 31.47 kg/m  HEENT: Normocephalic, atruamatic. PEARRL. No cervical lymphadenopathy. No thyroid nodules, normal size, and equal bilaterally. Ears: Bilateral ears without erythema, TM clear with good coen of light. TM without bulging. Nose: Patent, without erythema or discharge. Mouth/Throat: Moist mucous membranes. No erythema. Tonsils without erythema or tonsillar exudate.  Cardiovascular: RRR. No S3 or S4. No murmurs, rubs, or gallops.. R leg: 1+ pitting edema up to mid-calf. No calf tenderness. L leg: 1+ pitting edema up to distal tibia Pulses 2+ and equal in the LE. No varicosities, clubbing, or cyanosis.  Pulm: CTA bilaterally. No expiratory muscle use while breathing.  GI: +BS. NTND. No rigidity or guarding.  No rebound tenderness.  Neuro: CN 2-12 grossly intact.  Psych: A&O x 4. Mood and affect appropriate for situation.  Skin: Warm and dry. No rashes or excoriations on exposed skin.   A&P:  1. Bilateral lower extremity edema - most likely due to chronic venous insufficiency.  Plan: -Labs:CBC with Differential/Platelet, Comprehensive metabolic panel, TSH,  -Referrals: Ambulatory referral to Vascular Surgery  -Pharm: [facility] ([place]) [place]       Respectfully,  Delilah Shan, PA-S2

## 2016-09-20 NOTE — Patient Instructions (Addendum)
Wear compression socks - it is ok to start with your right leg to see if that helps your discomfort and swelling.  Try to elevate your legs.  Use the fluid pill as needed for the swelling.  [facility] Address: [address], [place], [place] [address]  Phone: [phone]  Schedule your appt for your complete physical.   IF you received an x-ray today, you will receive an invoice from [facility] Radiology. Please contact [facility] Radiology at [phone] with questions or concerns regarding your invoice.   IF you received labwork today, you will receive an invoice from [facility]. Please contact [facility] at [phone] with questions or concerns regarding your invoice.   Our billing staff will not be able to assist you with questions regarding bills from these companies.  You will be contacted with the lab results as soon as they are available. The fastest way to get your results is to activate your My Chart account. Instructions are located on the last page of this paperwork. If you have not heard from [place] regarding the results in 2 weeks, please contact this office.

## 2016-09-21 LAB — COMPREHENSIVE METABOLIC PANEL
ALT: 22 IU/L (ref 0–32)
AST: 24 IU/L (ref 0–40)
Albumin/Globulin Ratio: 1.3 (ref 1.2–2.2)
Albumin: 4.3 g/dL (ref 3.6–4.8)
Alkaline Phosphatase: 58 IU/L (ref 39–117)
BUN/Creatinine Ratio: 13 (ref 12–28)
BUN: 14 mg/dL (ref 8–27)
Bilirubin Total: 0.4 mg/dL (ref 0.0–1.2)
CO2: 25 mmol/L (ref 18–29)
Calcium: 9.5 mg/dL (ref 8.7–10.3)
Chloride: 107 mmol/L — ABNORMAL HIGH (ref 96–106)
Creatinine, Ser: 1.06 mg/dL — ABNORMAL HIGH (ref 0.57–1.00)
GFR, EST AFRICAN AMERICAN: 66 mL/min/{1.73_m2} (ref >59)
GFR, EST NON AFRICAN AMERICAN: 57 mL/min/{1.73_m2} — AB (ref >59)
GLUCOSE: 100 mg/dL — AB (ref 65–99)
Globulin, Total: 3.2 g/dL (ref 1.5–4.5)
Potassium: 4.6 mmol/L (ref 3.5–5.2)
Sodium: 146 mmol/L — ABNORMAL HIGH (ref 134–144)
TOTAL PROTEIN: 7.5 g/dL (ref 6.0–8.5)

## 2016-09-21 LAB — CBC WITH DIFFERENTIAL/PLATELET
BASOS ABS: 0 10*3/uL (ref 0.0–0.2)
Basos: 0 %
EOS (ABSOLUTE): 0.1 10*3/uL (ref 0.0–0.4)
Eos: 1 %
HEMOGLOBIN: 11.4 g/dL (ref 11.1–15.9)
Hematocrit: 35.5 % (ref 34.0–46.6)
Immature Grans (Abs): 0 10*3/uL (ref 0.0–0.1)
Immature Granulocytes: 0 %
LYMPHS: 47 %
Lymphocytes Absolute: 2.6 10*3/uL (ref 0.7–3.1)
MCH: 25.9 pg — ABNORMAL LOW (ref 26.6–33.0)
MCHC: 32.1 g/dL (ref 31.5–35.7)
MCV: 81 fL (ref 79–97)
Monocytes Absolute: 0.4 10*3/uL (ref 0.1–0.9)
Monocytes: 6 %
Neutrophils Absolute: 2.6 10*3/uL (ref 1.4–7.0)
Neutrophils: 46 %
Platelets: 299 10*3/uL (ref 150–379)
RBC: 4.4 x10E6/uL (ref 3.77–5.28)
RDW: 17.5 % — ABNORMAL HIGH (ref 12.3–15.4)
WBC: 5.7 10*3/uL (ref 3.4–10.8)

## 2016-09-21 LAB — TSH: TSH: 2.04 u[IU]/mL (ref 0.450–4.500)

## 2016-09-22 NOTE — Progress Notes (Signed)
Emily Lopez  MRN: [id number] DOB: 1955/10/13  PCP: No primary care provider on file.  Chief Complaint  Patient presents with  . Edema    Right ankle  . Labs Only    Pt fasting    Subjective:  Pt presents to clinic for evaluation of leg swelling and pain.  She has had this for years but over the last 2 weeks seems to be worse.  She changed shoes from birkenstocks to another type of flat shoe when she started to notice the worsening of her symptoms.  The pain is located where she has the swelling.  The swelling is resolved in the am and worse in the pm and improves with legs elevated.  She does not have pain when she is not swollen.  She has no feet pain or cold feet.  She tries to not eat salt and use salt substitutes.  She would also like a CPE but she will make an appt for this in the future.  Review of Systems  Respiratory: Negative for shortness of breath.   Cardiovascular: Positive for leg swelling. Negative for chest pain and palpitations.    Patient Active Problem List   Diagnosis Date Noted  . VITAMIN D DEFICIENCY 03/13/2009  . DEPRESSIVE DISORDER 08/24/2008  . OVERWEIGHT 07/25/2008  . ATTENTION DEFICIT DISORDER, ADULT 07/25/2008  . SYSTOLIC MURMUR 56/31/4970  . GERD 07/20/2007  . ANEMIA, IRON DEFICIENCY, UNSPEC. 08/07/2006  . HYPERTENSION, BENIGN SYSTEMIC 08/07/2006    Current Outpatient Prescriptions on File Prior to Visit  Medication Sig Dispense Refill  . amLODipine (NORVASC) 10 MG tablet Take 1 tablet (10 mg total) by mouth daily. 30 tablet 0  . lisinopril (PRINIVIL,ZESTRIL) 20 MG tablet Take 1 tablet (20 mg total) by mouth daily. 90 tablet 1  . Multiple Vitamin (MULTIVITAMIN) LIQD Take 5 mLs by mouth daily. Iaso  Nutri burst Liquid vitamin     No current facility-administered medications on file prior to visit.     Allergies  Allergen Reactions  . No Known Allergies     Pt patients past, family and social history were reviewed and updated.     Objective:  BP 120/74 (BP Location: Left Arm, Patient Position: Sitting, Cuff Size: Large)   Pulse 76   Temp 98.1 F (36.7 C) (Oral)   Resp 18   Ht 5\' 6"  (1.676 m)   Wt 195 lb (88.5 kg)   SpO2 97%   BMI 31.47 kg/m   Physical Exam  Constitutional: She is oriented to person, place, and time and well-developed, well-nourished, and in no distress.  HENT:  Head: Normocephalic and atraumatic.  Right Ear: Hearing and external ear normal.  Left Ear: Hearing and external ear normal.  Eyes: Conjunctivae are normal.  Neck: Normal range of motion.  Cardiovascular: Normal rate, regular rhythm and normal heart sounds.   No murmur heard. Pulses:      Dorsalis pedis pulses are 2+ on the right side, and 2+ on the left side.  Good capillary refill and feet are warm.  Pulmonary/Chest: Effort normal and breath sounds normal. She has no wheezes.  Musculoskeletal:       Right lower leg: She exhibits edema (1-2+ pitting edema from ankle to slightly above 1/3 distal shin 0- no erythema or wounds, no varicostieis). She exhibits no tenderness (no calf TTP).       Left lower leg: She exhibits edema (up to 1+ pitting edema from ankle to 1/3 distal shin, no erythema or wounds,  no varicosities). She exhibits no tenderness (no calf tenderness).  Neurological: She is alert and oriented to person, place, and time. Gait normal.  Skin: Skin is warm and dry.  Psychiatric: Mood, memory, affect and judgment normal.  Vitals reviewed.   Assessment and Plan :  Bilateral lower extremity edema - Plan: CBC with Differential/Platelet, Comprehensive metabolic panel, TSH, Ambulatory referral to Vascular Surgery, Elastic Bandages & Supports (MEDICAL COMPRESSION STOCKINGS) MISC, hydrochlorothiazide (HYDRODIURIL) 25 MG tablet - this is likely peripheral vascular disease - she was encouraged to decrease her salt, increase her leg elevated, continue supportive shoes for comfort.  She was started on compression socks to decrease  swelling which should help her pain and she was started on prn HCTZ to help with swelling.  Pt to schedule an appt for a CPE  Windell Hummingbird PA-C  [facility] 09/22/2016 8:50 AM

## 2016-09-25 ENCOUNTER — Telehealth: Payer: Self-pay | Admitting: Family Medicine

## 2016-09-25 NOTE — Telephone Encounter (Signed)
PATIENT CALLING WANTING A CLOSER REFERRAL OTHER THAN [place] SHE WOULD PREFER IT TO BE IN [place] OR [place] PLEASE CALL PT TO [place] REFERRAL

## 2016-09-25 NOTE — Telephone Encounter (Signed)
Referral originally listed that specific office, but I will resent to [facility].

## 2016-09-27 ENCOUNTER — Ambulatory Visit (INDEPENDENT_AMBULATORY_CARE_PROVIDER_SITE_OTHER): Admitting: Family Medicine

## 2016-09-27 VITALS — BP 163/82 | HR 78 | Temp 97.3°F | Resp 16 | Wt 196.2 lb

## 2016-09-27 DIAGNOSIS — Z1211 Encounter for screening for malignant neoplasm of colon: Secondary | ICD-10-CM | POA: Diagnosis not present

## 2016-09-27 DIAGNOSIS — Z0000 Encounter for general adult medical examination without abnormal findings: Secondary | ICD-10-CM

## 2016-09-27 DIAGNOSIS — E119 Type 2 diabetes mellitus without complications: Secondary | ICD-10-CM

## 2016-09-27 DIAGNOSIS — I10 Essential (primary) hypertension: Secondary | ICD-10-CM | POA: Diagnosis not present

## 2016-09-27 DIAGNOSIS — R600 Localized edema: Secondary | ICD-10-CM

## 2016-09-27 MED ORDER — AMLODIPINE BESYLATE 10 MG PO TABS: 10.0000 mg | ORAL_TABLET | Freq: Every day | ORAL | 1 refills | Status: DC

## 2016-09-27 MED ORDER — LISINOPRIL 20 MG PO TABS: 20.0000 mg | ORAL_TABLET | Freq: Every day | ORAL | 1 refills | Status: DC

## 2016-09-27 MED ORDER — HYDROCHLOROTHIAZIDE 25 MG PO TABS: 25.0000 mg | ORAL_TABLET | Freq: Every day | ORAL | 1 refills | Status: DC

## 2016-09-27 MED ORDER — ASPIRIN 81 MG PO TBEC: 81.0000 mg | DELAYED_RELEASE_TABLET | Freq: Every day | ORAL | 3 refills | Status: DC

## 2016-09-27 NOTE — Progress Notes (Signed)
Chief Complaint  Patient presents with  . Annual Exam    med refills no concerns    Subjective:  Emily Lopez is a 61 y.o. female here for a health maintenance visit.  Patient is established pt  Diabetes  Last May 2017 pt had a history of an elevated a1c of 6.6% She was diagnosed with Diabetes and records show that she was contacted with results. Today the patient states that she was not aware of this diagnosis She reports that she did not return for labs and has a healthy diet. She denies paresthesia, polyuria, polydipsia, polyphagia, chest pains, shortness of breath or palpitations.  She denies hypoglycemia.  She would like to know her a1c today. Lab Results  Component Value Date   HGBA1C 6.7 (H) 09/27/2016     Patient Active Problem List   Diagnosis Date Noted  . VITAMIN D DEFICIENCY 03/13/2009  . DEPRESSIVE DISORDER 08/24/2008  . OVERWEIGHT 07/25/2008  . ATTENTION DEFICIT DISORDER, ADULT 07/25/2008  . SYSTOLIC MURMUR 12/01/7626  . GERD 07/20/2007  . ANEMIA, IRON DEFICIENCY, UNSPEC. 08/07/2006  . HYPERTENSION, BENIGN SYSTEMIC 08/07/2006    Past Medical History:  Diagnosis Date  . Anxiety   . Arthritis   . Attention deficit disorder without mention of hyperactivity   . Depression   . Esophageal reflux   . Hypertension   . Iron deficiency anemia, unspecified   . Myalgia and myositis, unspecified   . Pneumonia    2008 or 2009  . Undiagnosed cardiac murmurs   . Unspecified vitamin D deficiency     No past surgical history on file.   Outpatient Medications Prior to Visit  Medication Sig Dispense Refill  . Elastic Bandages & Supports (MEDICAL COMPRESSION STOCKINGS) MISC Medium compression 25-35 mmHg 1 each 1  . Multiple Vitamin (MULTIVITAMIN) LIQD Take 5 mLs by mouth daily. [place]  Nutri burst Liquid vitamin    . amLODipine (NORVASC) 10 MG tablet Take 1 tablet (10 mg total) by mouth daily. 30 tablet 0  . hydrochlorothiazide (HYDRODIURIL) 25 MG tablet Take 0.5-1  tablets (12.5-25 mg total) by mouth daily as needed. 30 tablet 0  . lisinopril (PRINIVIL,ZESTRIL) 20 MG tablet Take 1 tablet (20 mg total) by mouth daily. 90 tablet 1   No facility-administered medications prior to visit.     Allergies  Allergen Reactions  . No Known Allergies      Family History  Problem Relation Age of Onset  . Hypertension Mother   . Diabetes Mother   . Arthritis Mother   . Hyperlipidemia Mother   . Hypertension Sister   . Lung cancer Father   . Heart attack Father   . Diabetes Brother      Health Habits: Dental Exam:  up to date Eye Exam: not up to date Exercise: 3 times/week on average Current exercise activities: walking/running/ bike riding Diet: balanced  Social History   Social History  . Marital status: Married    Spouse name: N/A  . Number of children: N/A  . Years of education: N/A   Occupational History  . Not on file.   Social History Main Topics  . Smoking status: Never Smoker  . Smokeless tobacco: Former Systems developer    Types: Snuff    Quit date: 07/24/2004  . Alcohol use 0.0 oz/week     Comment: 2 drinks  . Drug use: No  . Sexual activity: No   Other Topics Concern  . Not on file   Social History Narrative  .  No narrative on file   History  Alcohol Use  . 0.0 oz/week    Comment: 2 drinks   History  Smoking Status  . Never Smoker  Smokeless Tobacco  . Former Systems developer  . Types: Snuff  . Quit date: 07/24/2004   History  Drug Use No    GYN: Sexual Health Menstrual status: regular menses LMP: No LMP recorded. Patient is postmenopausal.  Last pap smear: see HM section History of abnormal pap smears: pap smear 2014, next pap due 2019 Sexually active: not current with female partner Current contraception: n/a  Health Maintenance: See under health Maintenance activity for review of completion dates as well. Immunization History  Administered Date(s) Administered  . Hepatitis A, Adult 12/24/2013  . Hepatitis B 01/25/2014   . Hepatitis B, adult 12/24/2013  . Influenza Split 01/25/2014  . PPD Test 07/01/2015  . Tdap 12/24/2013      Depression Screen-PHQ2/9 Depression screen [facility] 2/9 09/27/2016 09/20/2016 10/16/2015 09/04/2015 11/15/2014  Decreased Interest 0 0 0 0 0  Down, Depressed, Hopeless 0 0 0 0 0  PHQ - 2 Score 0 0 0 0 0       Depression Severity and Treatment Recommendations:  0-4= None  5-9= Mild / Treatment: Support, educate to call if worse; return in one month  10-14= Moderate / Treatment: Support, watchful waiting; Antidepressant or Psycotherapy  15-19= Moderately severe / Treatment: Antidepressant OR Psychotherapy  >= 20 = Major depression, severe / Antidepressant AND Psychotherapy    Review of Systems   Review of Systems  Constitutional: Negative for chills, fever and weight loss.  Cardiovascular:       See hpi  Gastrointestinal: Negative for abdominal pain, nausea and vomiting.  Genitourinary: Negative for dysuria, frequency, hematuria and urgency.  Skin: Negative for itching and rash.  Neurological: Negative for dizziness, tingling and headaches.  Psychiatric/Behavioral: Negative for depression. The patient is not nervous/anxious.     See HPI for ROS as well.    Objective:   Vitals:   09/27/16 1226  BP: (!) 163/82  Pulse: 78  Resp: 16  Temp: 97.3 F (36.3 C)  TempSrc: Oral  SpO2: 98%  Weight: 196 lb 3.2 oz (89 kg)    Body mass index is 31.67 kg/m. Diabetic Foot Exam - Simple   Simple Foot Form Diabetic Foot exam was performed with the following findings:  Yes 09/27/2016 12:52 PM  Visual Inspection No deformities, no ulcerations, no other skin breakdown bilaterally:  Yes Sensation Testing Intact to touch and monofilament testing bilaterally:  Yes Pulse Check Posterior Tibialis and Dorsalis pulse intact bilaterally:  Yes Comments     Physical Exam  Constitutional: She is oriented to person, place, and time. She appears well-developed and well-nourished.    HENT:  Head: Normocephalic and atraumatic.  Eyes: Conjunctivae and EOM are normal.  Neck: Normal range of motion. Neck supple.  Cardiovascular: Normal rate, regular rhythm and normal heart sounds.   Pulmonary/Chest: Effort normal and breath sounds normal. No respiratory distress. She has no wheezes. She has no rales. She exhibits no tenderness.  Abdominal: Soft. Bowel sounds are normal. She exhibits no distension and no mass. There is no tenderness. There is no rebound and no guarding.  Musculoskeletal: Normal range of motion. She exhibits no edema.  Neurological: She is alert and oriented to person, place, and time. She has normal reflexes. No cranial nerve deficit.  Skin: Skin is warm. No rash noted. No erythema.  Psychiatric: She has a normal mood  and affect. Her behavior is normal. Judgment and thought content normal.       Assessment/Plan:   Patient was seen for a health maintenance exam.  Counseled the patient on health maintenance issues. Reviewed her health mainteance schedule and ordered appropriate tests (see orders.) Counseled on regular exercise and weight management. Recommend regular eye exams and dental cleaning.   The following issues were addressed today for health maintenance:   Nykia was seen today for annual exam.  Diagnoses and all orders for this visit:  Encounter for health maintenance examination in adult- gave number to call for mammogram Discussed colonoscopy for screening -     HM COLONOSCOPY  Type 2 diabetes mellitus without complication, without long-term current use of insulin ([place])- diet controlled Next visit will encourage vaccinations Advised adding a baby aspirin for primary prevention -     Hemoglobin A1c -     Lipid panel -     Microalbumin, urine  Screen for colon cancer -     HM COLONOSCOPY  Bilateral lower extremity edema- refilled today Lab Results  Component Value Date   K 4.6 09/20/2016   Lab Results  Component Value Date    CREATININE 1.06 (H) 09/20/2016    -     hydrochlorothiazide (HYDRODIURIL) 25 MG tablet; Take 1 tablet (25 mg total) by mouth daily.  Essential hypertension-  Uncontrolled. Pt did not take meds today. Is well controlled when pt takes meds. refilled medication today Reviewed recent labs and renal panel <1.5 -     lisinopril (PRINIVIL,ZESTRIL) 20 MG tablet; Take 1 tablet (20 mg total) by mouth daily.  Other orders -     amLODipine (NORVASC) 10 MG tablet; Take 1 tablet (10 mg total) by mouth daily. -     aspirin 81 MG EC tablet; Take 1 tablet (81 mg total) by mouth daily. Swallow whole.    No Follow-up on file.    Body mass index is 31.67 kg/m.:  Discussed the patient's BMI with patient. The BMI body mass index is 31.67 kg/m.     Future Appointments Date Time Provider [place]  10/21/2016 2:00 PM Mal Misty, MD [facility]    Patient Instructions    We recommend that you schedule a mammogram for breast cancer screening. Typically, you do not need a referral to do this. Please contact a local imaging center to schedule your mammogram.  [facility] - [phone]  *ask for the Radiology Department [facility] [place] ([place]) - [phone] or [phone]  [facility] - [phone] [place] [phone] [facility] - [phone]  *ask for the [facility] - [phone]  *ask for the Radiology Department [facility] - [phone]  *ask for the [place] - [phone]    IF you received an x-ray today, you will receive an invoice from [facility]. Please contact [facility] at [phone] with questions or concerns regarding your invoice.   IF you received labwork today, you will receive an invoice from [place]. Please contact [facility] at [phone] with questions or concerns regarding your  invoice.   Our billing staff will not be able to assist you with questions regarding bills from these companies.  You will be contacted with the lab results as soon as they are available. The fastest way to get your results is to activate your My Chart account. Instructions are located on the last  page of this paperwork. If you have not heard from [place] regarding the results in 2 weeks, please contact this office.      Diabetes Mellitus and Standards of Medical Care Managing diabetes (diabetes mellitus) can be complicated. Your diabetes treatment may be managed by a team of health care providers, including:  A diet and nutrition specialist (registered dietitian).  A nurse.  A certified diabetes educator (CDE).  A diabetes specialist (endocrinologist).  An eye doctor.  A primary care provider.  A dentist. Your health care providers follow a schedule in order to help you get the best quality of care. The following schedule is a general guideline for your diabetes management plan. Your health care providers may also give you more specific instructions. HbA1c ( hemoglobin A1c) test This test provides information about blood sugar (glucose) control over the previous 2-3 months. It is used to check whether your diabetes management plan needs to be adjusted.  If you are meeting your treatment goals, this test is done at least 2 times a year.  If you are not meeting treatment goals or if your treatment goals have changed, this test is done 4 times a year. Blood pressure test  This test is done at every routine medical visit. For most people, the goal is less than 130/80. Ask your health care provider what your goal blood pressure should be. Dental and eye exams  Visit your dentist two times a year.  If you have type 1 diabetes, get an eye exam 3-5 years after you are diagnosed, and then once a year after your first exam.  If you were diagnosed with type 1 diabetes as a child, get an  eye exam when you are age 50 or older and have had diabetes for 3-5 years. After the first exam, you should get an eye exam once a year.  If you have type 2 diabetes, have an eye exam as soon as you are diagnosed, and then once a year after your first exam. Foot care exam  Visual foot exams are done at every routine medical visit. The exams check for cuts, bruises, redness, blisters, sores, or other problems with the feet.  A complete foot exam is done by your health care provider once a year. This exam includes an inspection of the structure and skin of your feet, and a check of the pulses and sensation in your feet.  Type 1 diabetes: Get your first exam 3-5 years after diagnosis.  Type 2 diabetes: Get your first exam as soon as you are diagnosed.  Check your feet every day for cuts, bruises, redness, blisters, or sores. If you have any of these or other problems that are not healing, contact your health care provider. Kidney function test ( urine microalbumin)  This test is done once a year.  Type 1 diabetes: Get your first test 5 years after diagnosis.  Type 2 diabetes: Get your first test as soon as you are diagnosed.  If you have chronic kidney disease (CKD), get a serum creatinine and estimated glomerular filtration rate (eGFR) test once a year. Lipid profile (cholesterol, HDL, LDL, triglycerides)  This test should be done when you are diagnosed with diabetes, and every 5 years after the first test. If you are on medicines to lower your cholesterol, you may need to get this test done every year.  The goal for LDL is less than 100 mg/dL (5.5 mmol/L). If you are at high risk, the goal is less than  70 mg/dL (3.9 mmol/L).    The goal for triglycerides is less than 150 mg/dL (8.3 mmol/L). Immunizations  The yearly flu (influenza) vaccine is recommended for everyone 6 months or older who has diabetes.  The pneumonia (pneumococcal) vaccine is recommended for everyone 2 years or  older who has diabetes. If you are 67 or older, you may get the pneumonia vaccine as a series of two separate shots.  The hepatitis B vaccine is recommended for adults shortly after they have been diagnosed with diabetes.     Mental and emotional health  Screening for symptoms of eating disorders, anxiety, and depression is recommended at the time of diagnosis and afterward as needed. If your screening shows that you have symptoms (you have a positive screening result), you may need further evaluation and be referred to a mental health care provider. Diabetes self-management education  Education about how to manage your diabetes is recommended at diagnosis and ongoing as needed. Treatment plan  Your treatment plan will be reviewed at every medical visit. Summary  Managing diabetes (diabetes mellitus) can be complicated. Your diabetes treatment may be managed by a team of health care providers.  Your health care providers follow a schedule in order to help you get the best quality of care.  Standards of care including having regular physical exams, blood tests, blood pressure monitoring, immunizations, screening tests, and education about how to manage your diabetes.  Your health care providers may also give you more specific instructions based on your individual health. This information is not intended to replace advice given to you by your health care provider. Make sure you discuss any questions you have with your health care provider. Document Released: 03/24/2009 Document Revised: 02/23/2016 Document Reviewed: 02/23/2016 Elsevier Interactive Patient Education  2017 [employer].    Next visit will discuss immunizations and recheck renal function

## 2016-09-27 NOTE — Patient Instructions (Addendum)
We recommend that you schedule a mammogram for breast cancer screening. Typically, you do not need a referral to do this. Please contact a local imaging center to schedule your mammogram.  [facility] - [phone]  *ask for the Radiology Department [facility] ([place]) - [phone] or [phone]  [facility] - [phone] [place] [phone] [facility]  - [phone]  *ask for the [facility] - [phone]  *ask for the Radiology Department [facility] - [phone]  *ask for the [place] - [phone]    IF you received an x-ray today, you will receive an invoice from [facility]. Please contact [facility] Radiology at [phone] with questions or concerns regarding your invoice.   IF you received labwork today, you will receive an invoice from [place]. Please contact [facility] at [phone] with questions or concerns regarding your invoice.   Our billing staff will not be able to assist you with questions regarding bills from these companies.  You will be contacted with the lab results as soon as they are available. The fastest way to get your results is to activate your My Chart account. Instructions are located on the last page of this paperwork. If you have not heard from [place] regarding the results in 2 weeks, please contact this office.      Diabetes Mellitus and Standards of Medical Care Managing diabetes (diabetes mellitus) can be complicated. Your diabetes treatment may be managed by a team of health care providers, including:  A diet and nutrition specialist (registered dietitian).  A nurse.  A certified diabetes educator (CDE).  A diabetes specialist (endocrinologist).  An eye doctor.  A primary care provider.  A dentist. Your health care providers follow a schedule in order to help  you get the best quality of care. The following schedule is a general guideline for your diabetes management plan. Your health care providers may also give you more specific instructions. HbA1c ( hemoglobin A1c) test This test provides information about blood sugar (glucose) control over the previous 2-3 months. It is used to check whether your diabetes management plan needs to be adjusted.  If you are meeting your treatment goals, this test is done at least 2 times a year.  If you are not meeting treatment goals or if your treatment goals have changed, this test is done 4 times a year. Blood pressure test  This test is done at every routine medical visit. For most people, the goal is less than 130/80. Ask your health care provider what your goal blood pressure should be. Dental and eye exams  Visit your dentist two times a year.  If you have type 1 diabetes, get an eye exam 3-5 years after you are diagnosed, and then once a year after your first exam.  If you were diagnosed with type 1 diabetes as a child, get an eye exam when you are age 50 or older and have had diabetes for 3-5 years. After the first exam, you should get an eye exam once a year.  If you have type 2 diabetes, have an eye exam as soon as you are diagnosed, and then once a year after your first exam. Foot care exam  Visual foot exams are done at every routine medical visit. The exams check for cuts, bruises, redness, blisters, sores, or other problems with the feet.  A complete foot exam is done by  your health care provider once a year. This exam includes an inspection of the structure and skin of your feet, and a check of the pulses and sensation in your feet.  Type 1 diabetes: Get your first exam 3-5 years after diagnosis.  Type 2 diabetes: Get your first exam as soon as you are diagnosed.  Check your feet every day for cuts, bruises, redness, blisters, or sores. If you have any of these or other problems that are not  healing, contact your health care provider. Kidney function test ( urine microalbumin)  This test is done once a year.  Type 1 diabetes: Get your first test 5 years after diagnosis.  Type 2 diabetes: Get your first test as soon as you are diagnosed.  If you have chronic kidney disease (CKD), get a serum creatinine and estimated glomerular filtration rate (eGFR) test once a year. Lipid profile (cholesterol, HDL, LDL, triglycerides)  This test should be done when you are diagnosed with diabetes, and every 5 years after the first test. If you are on medicines to lower your cholesterol, you may need to get this test done every year.  The goal for LDL is less than 100 mg/dL (5.5 mmol/L). If you are at high risk, the goal is less than 70 mg/dL (3.9 mmol/L).    The goal for triglycerides is less than 150 mg/dL (8.3 mmol/L). Immunizations  The yearly flu (influenza) vaccine is recommended for everyone 6 months or older who has diabetes.  The pneumonia (pneumococcal) vaccine is recommended for everyone 2 years or older who has diabetes. If you are 3 or older, you may get the pneumonia vaccine as a series of two separate shots.  The hepatitis B vaccine is recommended for adults shortly after they have been diagnosed with diabetes.     Mental and emotional health  Screening for symptoms of eating disorders, anxiety, and depression is recommended at the time of diagnosis and afterward as needed. If your screening shows that you have symptoms (you have a positive screening result), you may need further evaluation and be referred to a mental health care provider. Diabetes self-management education  Education about how to manage your diabetes is recommended at diagnosis and ongoing as needed. Treatment plan  Your treatment plan will be reviewed at every medical visit. Summary  Managing diabetes (diabetes mellitus) can be complicated. Your diabetes treatment may be managed by a team of  health care providers.  Your health care providers follow a schedule in order to help you get the best quality of care.  Standards of care including having regular physical exams, blood tests, blood pressure monitoring, immunizations, screening tests, and education about how to manage your diabetes.  Your health care providers may also give you more specific instructions based on your individual health. This information is not intended to replace advice given to you by your health care provider. Make sure you discuss any questions you have with your health care provider. Document Released: 03/24/2009 Document Revised: 02/23/2016 Document Reviewed: 02/23/2016 Elsevier Interactive Patient Education  2017 [employer].    Next visit will discuss immunizations and recheck renal function

## 2016-09-28 LAB — LIPID PANEL
CHOLESTEROL TOTAL: 166 mg/dL (ref 100–199)
Chol/HDL Ratio: 2.3 ratio (ref 0.0–4.4)
HDL: 71 mg/dL (ref >39)
LDL CALC: 76 mg/dL (ref 0–99)
Triglycerides: 93 mg/dL (ref 0–149)
VLDL Cholesterol Cal: 19 mg/dL (ref 5–40)

## 2016-09-28 LAB — HEMOGLOBIN A1C
ESTIMATED AVERAGE GLUCOSE: 146 mg/dL
HEMOGLOBIN A1C: 6.7 % — AB (ref 4.8–5.6)

## 2016-09-28 LAB — MICROALBUMIN, URINE: Microalbumin, Urine: 15.5 ug/mL

## 2016-09-29 ENCOUNTER — Encounter: Payer: Self-pay | Admitting: Family Medicine

## 2016-10-01 ENCOUNTER — Telehealth: Payer: Self-pay | Admitting: Family Medicine

## 2016-10-01 NOTE — Telephone Encounter (Signed)
PT RETURN CALL ABOUT LABS I GAVE HER MESSAGE PT UNDERSTOOD MESSAGE AND WILL CALL BACK FOR APPOINTMENT NEXT MONTH

## 2016-10-07 ENCOUNTER — Other Ambulatory Visit: Payer: Self-pay | Admitting: Family Medicine

## 2016-10-07 ENCOUNTER — Encounter: Payer: Self-pay | Admitting: Family Medicine

## 2016-10-07 DIAGNOSIS — Z1231 Encounter for screening mammogram for malignant neoplasm of breast: Secondary | ICD-10-CM

## 2016-10-16 ENCOUNTER — Encounter: Payer: Self-pay | Admitting: Vascular Surgery

## 2016-10-21 ENCOUNTER — Encounter: Payer: Self-pay | Admitting: Vascular Surgery

## 2016-10-21 ENCOUNTER — Ambulatory Visit (INDEPENDENT_AMBULATORY_CARE_PROVIDER_SITE_OTHER): Payer: Self-pay | Admitting: Vascular Surgery

## 2016-10-21 VITALS — BP 139/79 | HR 78 | Temp 97.3°F | Resp 16 | Ht 66.0 in | Wt 193.6 lb

## 2016-10-21 DIAGNOSIS — M7989 Other specified soft tissue disorders: Secondary | ICD-10-CM | POA: Insufficient documentation

## 2016-10-21 NOTE — Progress Notes (Signed)
Subjective:     Patient ID: Emily Lopez, female   DOB: 09-18-1955, 61 y.o.   MRN: [id number]  HPI This 61 year old female was self-referred for evaluation of swelling in the right lower extremity. She states that for the past 3-4 weeks she has been having some swelling below the knee mostly in the lower third of the right leg near the ankle which she had not experienced in the past. She has no history of DVT thrombophlebitis stasis ulcers or bleeding. She has worn short leg elastic compression stockings which seemed to help. She has no symptoms in the left leg.  Past Medical History:  Diagnosis Date  . Anxiety   . Arthritis   . Attention deficit disorder without mention of hyperactivity   . Depression   . Esophageal reflux   . Hypertension   . Iron deficiency anemia, unspecified   . Myalgia and myositis, unspecified   . Pneumonia    2008 or 2009  . Undiagnosed cardiac murmurs   . Unspecified vitamin D deficiency     Social History  Substance Use Topics  . Smoking status: Never Smoker  . Smokeless tobacco: Former Systems developer    Types: Snuff    Quit date: 07/24/2004  . Alcohol use 0.0 oz/week     Comment: 2 drinks    Family History  Problem Relation Age of Onset  . Hypertension Mother   . Diabetes Mother   . Arthritis Mother   . Hyperlipidemia Mother   . Hypertension Sister   . Lung cancer Father   . Heart attack Father   . Diabetes Brother     Allergies  Allergen Reactions  . No Known Allergies      Current Outpatient Prescriptions:  .  amLODipine (NORVASC) 10 MG tablet, Take 1 tablet (10 mg total) by mouth daily., Disp: 90 tablet, Rfl: 1 .  aspirin 81 MG EC tablet, Take 1 tablet (81 mg total) by mouth daily. Swallow whole., Disp: 90 tablet, Rfl: 3 .  Elastic Bandages & Supports (MEDICAL COMPRESSION STOCKINGS) MISC, Medium compression 25-35 mmHg, Disp: 1 each, Rfl: 1 .  GARLIC PO, Take 1 tablet by mouth., Disp: , Rfl:  .  Lactobacillus (PROBIOTIC ACIDOPHILUS) TABS,  Take 1 tablet by mouth., Disp: , Rfl:  .  lisinopril (PRINIVIL,ZESTRIL) 20 MG tablet, Take 1 tablet (20 mg total) by mouth daily., Disp: 90 tablet, Rfl: 1 .  Multiple Vitamin (MULTIVITAMIN) LIQD, Take 5 mLs by mouth daily. Iaso  Nutri burst Liquid vitamin, Disp: , Rfl:  .  hydrochlorothiazide (HYDRODIURIL) 25 MG tablet, Take 1 tablet (25 mg total) by mouth daily. (Patient not taking: Reported on 10/21/2016), Disp: 90 tablet, Rfl: 1  Vitals:   10/21/16 1402  BP: 139/79  Pulse: 78  Resp: 16  Temp: 97.3 F (36.3 C)  TempSrc: Oral  SpO2: 95%  Weight: 193 lb 9.6 oz (87.8 kg)  Height: 5\' 6"  (1.676 m)    Body mass index is 31.25 kg/m.         Review of Systems Has history of hypertension, arthritis. Denies chest pain, dyspnea on exertion, PND, orthopnea, hemoptysis, claudication    Objective:   Physical Exam BP 139/79 (BP Location: Left Arm, Patient Position: Sitting, Cuff Size: Large)   Pulse 78   Temp 97.3 F (36.3 C) (Oral)   Resp 16   Ht 5\' 6"  (1.676 m)   Wt 193 lb 9.6 oz (87.8 kg)   SpO2 95%   BMI 31.25 kg/m  Gen.-alert and oriented x3 in no apparent distress HEENT normal for age Lungs no rhonchi or wheezing Cardiovascular regular rhythm no murmurs carotid pulses 3+ palpable no bruits audible Abdomen soft nontender no palpable masses Musculoskeletal free of  major deformities Skin clear -no rashes Neurologic normal Lower extremities 3+ femoral and dorsalis pedis pulses palpable bilaterally with no edema noted on physical exam No bulging varicosities, hyperpigmentation, or ulceration.  Today I performed a bedside sono site ultrasound exam of the right leg and examined the right great 7 vein which is normal in size and there is no evidence of gross reflux.       Assessment:     Recent onset of distal edema right lower extremity-etiology unknown    Plan:     Discussed with patient that we could order a formal venous reflux ultrasound exam looking at  both the superficial and deep systems but that I did not think he would change the treatment-would be happy to order that if the patient desired which she does not at the present time #1 elevate foot of bed 2 inches #2 short leg elastic compression stockings #3 hopefully this will be a transient phenomenon and she will be able to stop wearing the stockings at some point in the near future if the swelling subsides Return to see me on a when necessary basis

## 2016-12-13 ENCOUNTER — Encounter (HOSPITAL_COMMUNITY): Payer: Self-pay

## 2016-12-13 ENCOUNTER — Emergency Department (HOSPITAL_COMMUNITY)
Admission: EM | Admit: 2016-12-13 | Discharge: 2016-12-13 | Disposition: A | Discharge status: Home / Self Care | Attending: Emergency Medicine | Admitting: Emergency Medicine

## 2016-12-13 DIAGNOSIS — Z79899 Other long term (current) drug therapy: Secondary | ICD-10-CM | POA: Insufficient documentation

## 2016-12-13 DIAGNOSIS — S39012A Strain of muscle, fascia and tendon of lower back, initial encounter: Secondary | ICD-10-CM | POA: Diagnosis not present

## 2016-12-13 DIAGNOSIS — Y999 Unspecified external cause status: Secondary | ICD-10-CM | POA: Insufficient documentation

## 2016-12-13 DIAGNOSIS — Y92832 Beach as the place of occurrence of the external cause: Secondary | ICD-10-CM | POA: Insufficient documentation

## 2016-12-13 DIAGNOSIS — Z7982 Long term (current) use of aspirin: Secondary | ICD-10-CM | POA: Diagnosis not present

## 2016-12-13 DIAGNOSIS — Y9301 Activity, walking, marching and hiking: Secondary | ICD-10-CM | POA: Diagnosis not present

## 2016-12-13 DIAGNOSIS — X509XXA Other and unspecified overexertion or strenuous movements or postures, initial encounter: Secondary | ICD-10-CM | POA: Diagnosis not present

## 2016-12-13 DIAGNOSIS — I10 Essential (primary) hypertension: Secondary | ICD-10-CM | POA: Diagnosis not present

## 2016-12-13 DIAGNOSIS — S3992XA Unspecified injury of lower back, initial encounter: Secondary | ICD-10-CM | POA: Diagnosis present

## 2016-12-13 MED ORDER — METHOCARBAMOL 500 MG PO TABS: 500.0000 mg | ORAL_TABLET | Freq: Two times a day (BID) | ORAL | 0 refills | Status: DC

## 2016-12-13 MED ORDER — MELOXICAM 7.5 MG PO TABS
7.5000 mg | ORAL_TABLET | Freq: Once | ORAL | Status: AC
  Administered 2016-12-13: 7.5 mg via ORAL
  Filled 2016-12-13: qty 1

## 2016-12-13 MED ORDER — MELOXICAM 7.5 MG PO TABS: 7.5000 mg | ORAL_TABLET | Freq: Every day | ORAL | 0 refills | Status: DC

## 2016-12-13 NOTE — ED Provider Notes (Signed)
[facility] DEPT Provider Note    By signing my name below, I, Bea Graff, attest that this documentation has been prepared under the direction and in the presence of Carle Fenech, PA-C. Electronically Signed: Bea Graff, ED Scribe. 12/13/16. 2:52 PM.   History   Chief Complaint Chief Complaint  Patient presents with  . Back Pain   The history is provided by the patient and medical records. No language interpreter was used.    Emily Lopez is a 61 y.o. female who presents to the Emergency Department complaining of worsening mid back pain that began three days ago. She reports associated pain that radiates down her posterior RUE to her hand. [place] causing herself to almost fall and she twisted her mid back. She has taken Tylenol (x1 yesterday) and Aleve (x1 2 days prior) for pain with no significant relief. Moving her head (worse to the right) or RUE increases the pain. She denies alleviating factors. She denies low back pain, bowel or bladder incontinence, CP, SOB, nausea, vomiting, abdominal pain, bruising, wounds, numbness, tingling or weakness of any extremity. She states she initially hurt her back a few years ago and had a fracture of the mid back but states the pain resolved since then.    Past Medical History:  Diagnosis Date  . Anxiety   . Arthritis   . Attention deficit disorder without mention of hyperactivity   . Depression   . Esophageal reflux   . Hypertension   . Iron deficiency anemia, unspecified   . Myalgia and myositis, unspecified   . Pneumonia    2008 or 2009  . Undiagnosed cardiac murmurs   . Unspecified vitamin D deficiency     Patient Active Problem List   Diagnosis Date Noted  . Right leg swelling 10/21/2016  . VITAMIN D DEFICIENCY 03/13/2009  . DEPRESSIVE DISORDER 08/24/2008  . OVERWEIGHT 07/25/2008  . ATTENTION DEFICIT DISORDER, ADULT 07/25/2008  . SYSTOLIC MURMUR 76/54/6503  . GERD  07/20/2007  . ANEMIA, IRON DEFICIENCY, UNSPEC. 08/07/2006  . HYPERTENSION, BENIGN SYSTEMIC 08/07/2006    History reviewed. No pertinent surgical history.  OB History    No data available       Home Medications    Prior to Admission medications   Medication Sig Start Date End Date Taking? Authorizing Provider  amLODipine (NORVASC) 10 MG tablet Take 1 tablet (10 mg total) by mouth daily. 09/27/16 12/26/16  Forrest Moron, MD  aspirin 81 MG EC tablet Take 1 tablet (81 mg total) by mouth daily. Swallow whole. 09/27/16   Forrest Moron, MD  Elastic Bandages & Supports (MEDICAL COMPRESSION STOCKINGS) MISC Medium compression 25-35 mmHg 09/20/16   Weber, Sarah L, PA-C  GARLIC PO Take 1 tablet by mouth.    [provider]  hydrochlorothiazide (HYDRODIURIL) 25 MG tablet Take 1 tablet (25 mg total) by mouth daily. Patient not taking: Reported on 10/21/2016 09/27/16   Forrest Moron, MD  Lactobacillus (PROBIOTIC ACIDOPHILUS) TABS Take 1 tablet by mouth.    [provider]  lisinopril (PRINIVIL,ZESTRIL) 20 MG tablet Take 1 tablet (20 mg total) by mouth daily. 09/27/16   Forrest Moron, MD  meloxicam (MOBIC) 7.5 MG tablet Take 1 tablet (7.5 mg total) by mouth daily. 12/13/16   Kendan Cornforth, PA-C  methocarbamol (ROBAXIN) 500 MG tablet Take 1 tablet (500 mg total) by mouth 2 (two) times daily. 12/13/16   Saidi Santacroce, PA-C  Multiple Vitamin (MULTIVITAMIN)  LIQD Take 5 mLs by mouth daily. Iaso  Nutri burst Liquid vitamin    [provider]    Family History Family History  Problem Relation Age of Onset  . Hypertension Mother   . Diabetes Mother   . Arthritis Mother   . Hyperlipidemia Mother   . Lung cancer Father   . Heart attack Father   . Diabetes Brother   . Hypertension Sister     Social History Social History  Substance Use Topics  . Smoking status: Never Smoker  . Smokeless tobacco: Former Systems developer    Types: Snuff    Quit date: 07/24/2004  . Alcohol  use 0.0 oz/week     Comment: 2 drinks     Allergies   No known allergies   Review of Systems Review of Systems  Respiratory: Negative for shortness of breath.   Cardiovascular: Negative for chest pain.  Gastrointestinal: Negative for abdominal pain, nausea and vomiting.       No bowel or bladder incontinence  Musculoskeletal: Positive for back pain.  Skin: Negative for color change and wound.  Neurological: Negative for weakness and numbness.     Physical Exam Updated Vital Signs BP (!) 199/97 (BP Location: Left Arm)   Pulse 63   Temp 98.7 F (37.1 C) (Oral)   Resp 12   Ht 5\' 6"  (1.676 m)   Wt 190 lb (86.2 kg)   SpO2 98%   BMI 30.67 kg/m   Physical Exam  Constitutional: She is oriented to person, place, and time. She appears well-developed and well-nourished.  HENT:  Head: Normocephalic and atraumatic.  Neck: Normal range of motion.  Cardiovascular: Normal rate and regular rhythm.   Pulmonary/Chest: Effort normal and breath sounds normal.  Musculoskeletal: Normal range of motion.  No obvious signs of swelling, contusion, laceration or injury. No TTP of spine or spinous processes. TTP of the right sided trapezius from mid back to shoulder. No TTP of the ribs. No tenderness of left back musculature. Decreased ROM of head due to pain. Full ROM of BUE. Sensation intact of BUE. Strength equal. Pt is ambulatory.  Neurological: She is alert and oriented to person, place, and time.  Skin: Skin is warm and dry.  Psychiatric: She has a normal mood and affect. Her behavior is normal.  Nursing note and vitals reviewed.    ED Treatments / Results  DIAGNOSTIC STUDIES: Oxygen Saturation is 98% on RA, normal by my interpretation.   COORDINATION OF CARE: 2:06 PM- Will prescribe Robaxin and Mobic. Will order first dose of Mobic prior to discharge. Advised pt that imaging is not indicated at this time. Instructed pt to discontinue taking any OTC NSAIDs. Recommended Tylenol for  additional pain relief. Return precautions discussed. Pt verbalizes understanding and agrees to plan.  Medications  meloxicam (MOBIC) tablet 7.5 mg (7.5 mg Oral Given 12/13/16 1435)    Labs (all labs ordered are listed, but only abnormal results are displayed) Labs Reviewed - No data to display  EKG  EKG Interpretation None       Radiology No results found.  Procedures Procedures (including critical care time)  Medications Ordered in ED Medications  meloxicam (MOBIC) tablet 7.5 mg (7.5 mg Oral Given 12/13/16 1435)     Initial Impression / Assessment and Plan / ED Course  I have reviewed the triage vital signs and the nursing notes.  Pertinent labs & imaging results that were available during my care of the patient were reviewed by me and considered  in my medical decision making (see chart for details).     Patient with back pain following twisting motion. No neurological deficits and normal neuro exam. Patient is ambulatory. No loss of bowel or bladder control.  No concern for cauda equina.  No fever, night sweats, weight loss, h/o cancer, IVDA, no recent procedure to back. No urinary symptoms suggestive of UTI. No pain of the spinous process, at this time I do not believe patient needs imaging. Supportive care and return precaution discussed. Will discharge patient with prescription for Mobic and Robaxin. Appears safe for discharge at this time. Follow up as indicated in discharge paperwork. Patient states she understands and agrees to plan.    Final Clinical Impressions(s) / ED Diagnoses   Final diagnoses:  Back strain, initial encounter    New Prescriptions Discharge Medication List as of 12/13/2016  2:21 PM    START taking these medications   Details  meloxicam (MOBIC) 7.5 MG tablet Take 1 tablet (7.5 mg total) by mouth daily., Starting Fri 12/13/2016, Print    methocarbamol (ROBAXIN) 500 MG tablet Take 1 tablet (500 mg total) by mouth 2 (two) times daily., Starting  Fri 12/13/2016, Print       I personally performed the services described in this documentation, which was scribed in my presence. The recorded information has been reviewed and is accurate.    Franchot Heidelberg, PA-C 12/13/16 1512    Orlie Dakin, MD 12/13/16 [phone]

## 2016-12-13 NOTE — ED Triage Notes (Signed)
Patient c/o mid back pain. Patient states she almost fell and twisted her back a few days ago. Patient denies any numbness or tingling of extremities. Patient states pain radiates into her right arm.

## 2016-12-13 NOTE — ED Notes (Signed)
Pt verbalized understanding of discharge instructions and denies any further questions at this time.   

## 2016-12-13 NOTE — Discharge Instructions (Signed)
Take Mobic daily as needed for pain. Do not take other anti-inflammatories while taking Mobic. You may supplement with Tylenol if needed for pain You may take the muscle relaxant as needed for muscle pain or stiffness. Try warm compresses and/or warm baths for symptom control. You may try the back stretches explained in the paperwork. You may follow-up with neurology in 2 weeks if symptoms are not improving. Return to the emergency department if you develop fever, chills, numbness or tingling, or any new or worsening symptoms.

## 2017-03-09 ENCOUNTER — Encounter (HOSPITAL_COMMUNITY): Payer: Self-pay | Admitting: Emergency Medicine

## 2017-03-09 ENCOUNTER — Ambulatory Visit (HOSPITAL_COMMUNITY)
Admission: EM | Admit: 2017-03-09 | Discharge: 2017-03-09 | Disposition: A | Discharge status: Home / Self Care | Attending: Urgent Care | Admitting: Urgent Care

## 2017-03-09 ENCOUNTER — Ambulatory Visit (INDEPENDENT_AMBULATORY_CARE_PROVIDER_SITE_OTHER)

## 2017-03-09 DIAGNOSIS — S93402A Sprain of unspecified ligament of left ankle, initial encounter: Secondary | ICD-10-CM

## 2017-03-09 DIAGNOSIS — M25572 Pain in left ankle and joints of left foot: Secondary | ICD-10-CM

## 2017-03-09 DIAGNOSIS — M25472 Effusion, left ankle: Secondary | ICD-10-CM

## 2017-03-09 MED ORDER — NAPROXEN 500 MG PO TABS: 500.0000 mg | ORAL_TABLET | Freq: Two times a day (BID) | ORAL | 0 refills | Status: DC

## 2017-03-09 NOTE — ED Provider Notes (Signed)
MRN: [id number] DOB: 09-29-1955  Subjective:   Emily Lopez is a 61 y.o. female presenting for chief complaint of Foot Injury  Reports suffering a fall by missing a step today, rolled her left ankle. She has since had pain, swelling, difficulty bearing weight. Denies bony deformity, bruising.   No current facility-administered medications for this encounter.   Current Outpatient Prescriptions:  .  amLODipine (NORVASC) 10 MG tablet, Take 10 mg by mouth daily., Disp: , Rfl:  .  aspirin 81 MG EC tablet, Take 1 tablet (81 mg total) by mouth daily. Swallow whole., Disp: 90 tablet, Rfl: 3 .  lisinopril (PRINIVIL,ZESTRIL) 20 MG tablet, Take 1 tablet (20 mg total) by mouth daily., Disp: 90 tablet, Rfl: 1 .  amLODipine (NORVASC) 10 MG tablet, Take 1 tablet (10 mg total) by mouth daily., Disp: 90 tablet, Rfl: 1 .  Elastic Bandages & Supports (MEDICAL COMPRESSION STOCKINGS) MISC, Medium compression 25-35 mmHg, Disp: 1 each, Rfl: 1 .  GARLIC PO, Take 1 tablet by mouth., Disp: , Rfl:  .  hydrochlorothiazide (HYDRODIURIL) 25 MG tablet, Take 1 tablet (25 mg total) by mouth daily. (Patient not taking: Reported on 10/21/2016), Disp: 90 tablet, Rfl: 1 .  Lactobacillus (PROBIOTIC ACIDOPHILUS) TABS, Take 1 tablet by mouth., Disp: , Rfl:  .  meloxicam (MOBIC) 7.5 MG tablet, Take 1 tablet (7.5 mg total) by mouth daily., Disp: 20 tablet, Rfl: 0 .  methocarbamol (ROBAXIN) 500 MG tablet, Take 1 tablet (500 mg total) by mouth 2 (two) times daily., Disp: 20 tablet, Rfl: 0 .  Multiple Vitamin (MULTIVITAMIN) LIQD, Take 5 mLs by mouth daily. Iaso  Nutri burst Liquid vitamin, Disp: , Rfl:  .  naproxen (NAPROSYN) 500 MG tablet, Take 1 tablet (500 mg total) by mouth 2 (two) times daily., Disp: 30 tablet, Rfl: 0   [name] is allergic to no known allergies.  [name]  has a past medical history of Anxiety; Arthritis; Attention deficit disorder without mention of hyperactivity; Depression; Esophageal reflux; Hypertension;  Iron deficiency anemia, unspecified; Myalgia and myositis, unspecified; Pneumonia; Undiagnosed cardiac murmurs; and Unspecified vitamin D deficiency. Also  has no past surgical history on file.  Objective:   Vitals: BP (!) 187/89 (BP Location: Left Arm) Comment (BP Location): large cuff  Pulse 86   Temp 99.3 F (37.4 C) (Oral)   Resp (!) 24   SpO2 97%   Physical Exam  Constitutional: She is oriented to person, place, and time. She appears well-developed and well-nourished.  Cardiovascular: Normal rate.   Pulmonary/Chest: Effort normal.  Musculoskeletal:       Left ankle: She exhibits decreased range of motion and swelling (lateral malleolus only). She exhibits no ecchymosis, no deformity, no laceration and normal pulse. Tenderness. Lateral malleolus and head of 5th metatarsal tenderness found. No medial malleolus, no AITFL, no CF ligament, no posterior TFL and no proximal fibula tenderness found. Achilles tendon exhibits no pain and no defect.  Neurological: She is alert and oriented to person, place, and time.   Dg Ankle Complete Left  Result Date: 03/09/2017 CLINICAL DATA:  Left ankle pain after injury today. EXAM: LEFT ANKLE COMPLETE - 3+ VIEW COMPARISON:  None. FINDINGS: There is no evidence of fracture, dislocation, or joint effusion. There is no evidence of arthropathy or other focal bone abnormality. Soft tissues are unremarkable. IMPRESSION: Normal left ankle. Electronically Signed   By: Marijo Conception, M.D.   On: 03/09/2017 19:23    Assessment and Plan :   Sprain  of left ankle, unspecified ligament, initial encounter  Left ankle swelling  Will start conservative management with RICE method. Use NSAID as well for pain and inflammation. Return-to-clinic precautions discussed, patient verbalized understanding.    Jaynee Eagles, PA-C [facility]  03/09/2017  7:39 PM    Jaynee Eagles, PA-C 03/09/17 2346

## 2017-03-09 NOTE — ED Triage Notes (Signed)
Reports left ankle  injury today.  Missed a step when going down steps and heard a pop in left ankle.  Swelling and pain to lateral left ankle.

## 2017-04-25 ENCOUNTER — Other Ambulatory Visit: Payer: Self-pay

## 2017-04-25 ENCOUNTER — Encounter (HOSPITAL_COMMUNITY): Payer: Self-pay

## 2017-04-25 DIAGNOSIS — Z5321 Procedure and treatment not carried out due to patient leaving prior to being seen by health care provider: Secondary | ICD-10-CM | POA: Diagnosis not present

## 2017-04-25 DIAGNOSIS — M549 Dorsalgia, unspecified: Secondary | ICD-10-CM | POA: Insufficient documentation

## 2017-04-25 NOTE — ED Triage Notes (Signed)
Pt presents to ED c/o of lower left sided back pain that started yesterday. History of High blood pressure.

## 2017-04-26 ENCOUNTER — Emergency Department (HOSPITAL_COMMUNITY)
Admission: EM | Admit: 2017-04-26 | Discharge: 2017-04-26 | Discharge status: Against Medical Advice | Attending: Emergency Medicine | Admitting: Emergency Medicine

## 2017-04-26 NOTE — ED Notes (Signed)
Pt gave her stickers to registration. LWBS.

## 2017-05-19 ENCOUNTER — Other Ambulatory Visit: Payer: Self-pay | Admitting: *Deleted

## 2017-05-19 DIAGNOSIS — I10 Essential (primary) hypertension: Secondary | ICD-10-CM

## 2017-05-19 MED ORDER — LISINOPRIL 20 MG PO TABS: 20.0000 mg | ORAL_TABLET | Freq: Every day | ORAL | 1 refills | Status: DC

## 2017-05-21 NOTE — Progress Notes (Signed)
Chief Complaint  Patient presents with  . Hypertension    HPI   Hypertension: Patient here for follow-up of elevated blood pressure. She is not exercising and is adherent to low salt diet.  Blood pressure is well controlled at home. Cardiac symptoms none. Patient denies chest pain, exertional chest pressure/discomfort, fatigue, irregular heart beat, lower extremity edema and palpitations.  Cardiovascular risk factors: diabetes mellitus, hypertension and obesity (BMI >= 30 kg/m2). Use of agents associated with hypertension: none. History of target organ damage: none. She takes amlodipine and lisinopril And hctz for prn peripheral edema   BP Readings from Last 3 Encounters:  05/22/17 132/84  04/25/17 (!) 150/85  03/09/17 (!) 187/89    Lab Results  Component Value Date   CREATININE 1.06 (H) 09/20/2016   Lab Results  Component Value Date   K 4.6 09/20/2016    Diabetes Mellitus: Patient presents for follow up of diabetes. Symptoms: none. Symptoms have stabilized. Patient denies foot ulcerations, hyperglycemia, hypoglycemia , increase appetite, nausea, paresthesia of the feet, polydipsia, polyuria and vomitting.  Evaluation to date has been included: hemoglobin A1C.  Home sugars: patient does not check sugars. Treatment to date: no recent interventions.  Lab Results  Component Value Date   HGBA1C 6.7 (H) 09/27/2016    Obesity She is diet controlled diabetic who adheres to an ADA diet She has noted that she gained 3-5 pounds and is feeling very hungry She eats lots of vegetables and her favorite snack is hummus with celery She exercises once a week at a dance class  Wt Readings from Last 3 Encounters:  05/22/17 193 lb 6.4 oz (87.7 kg)  04/25/17 190 lb (86.2 kg)  12/13/16 190 lb (86.2 kg)   Body mass index is 30.29 kg/m.    Past Medical History:  Diagnosis Date  . Anxiety   . Arthritis   . Attention deficit disorder without mention of hyperactivity   . Depression   .  Esophageal reflux   . Hypertension   . Iron deficiency anemia, unspecified   . Myalgia and myositis, unspecified   . Pneumonia    2008 or 2009  . Undiagnosed cardiac murmurs   . Unspecified vitamin D deficiency     Current Outpatient Medications  Medication Sig Dispense Refill  . amLODipine (NORVASC) 10 MG tablet Take 1 tablet (10 mg total) by mouth daily. 90 tablet 1  . aspirin 81 MG EC tablet Take 1 tablet (81 mg total) by mouth daily. Swallow whole. 90 tablet 3  . hydrochlorothiazide (HYDRODIURIL) 25 MG tablet Take 1 tablet (25 mg total) by mouth daily. 90 tablet 1  . lisinopril (PRINIVIL,ZESTRIL) 20 MG tablet Take 1 tablet (20 mg total) by mouth daily. 90 tablet 1  . Elastic Bandages & Supports (MEDICAL COMPRESSION STOCKINGS) MISC Medium compression 25-35 mmHg (Patient not taking: Reported on 05/22/2017) 1 each 1  . GARLIC PO Take 1 tablet by mouth.    . Lactobacillus (PROBIOTIC ACIDOPHILUS) TABS Take 1 tablet by mouth.    . meloxicam (MOBIC) 7.5 MG tablet Take 1 tablet (7.5 mg total) by mouth daily. (Patient not taking: Reported on 05/22/2017) 20 tablet 0  . methocarbamol (ROBAXIN) 500 MG tablet Take 1 tablet (500 mg total) by mouth 2 (two) times daily. (Patient not taking: Reported on 05/22/2017) 20 tablet 0  . Multiple Vitamin (MULTIVITAMIN) LIQD Take 5 mLs by mouth daily. [place]  Nutri burst Liquid vitamin    . naproxen (NAPROSYN) 500 MG tablet Take 1 tablet (500 mg  total) by mouth 2 (two) times daily. (Patient not taking: Reported on 05/22/2017) 30 tablet 0   No current facility-administered medications for this visit.     Allergies:  Allergies  Allergen Reactions  . No Known Allergies     No past surgical history on file.  Social History   Socioeconomic History  . Marital status: Married    Spouse name: None  . Number of children: None  . Years of education: None  . Highest education level: None  Social Needs  . Financial resource strain: None  . Food insecurity -  worry: None  . Food insecurity - inability: None  . Transportation needs - medical: None  . Transportation needs - non-medical: None  Occupational History  . None  Tobacco Use  . Smoking status: Never Smoker  . Smokeless tobacco: Former Systems developer    Types: Snuff  Substance and Sexual Activity  . Alcohol use: Yes    Alcohol/week: 0.0 oz    Comment: 2 drinks  . Drug use: No  . Sexual activity: No    Birth control/protection: None  Other Topics Concern  . None  Social History Narrative  . None    Family History  Problem Relation Age of Onset  . Hypertension Mother   . Diabetes Mother   . Arthritis Mother   . Hyperlipidemia Mother   . Lung cancer Father   . Heart attack Father   . Diabetes Brother   . Hypertension Sister      ROS Review of Systems See HPI Constitution: No fevers or chills No malaise No diaphoresis Skin: No rash or itching Eyes: no blurry vision, no double vision GU: no dysuria or hematuria Neuro: no dizziness or headaches all others reviewed and negative   Objective: Vitals:   05/22/17 0909  BP: 132/84  Pulse: 91  Resp: 18  Temp: 97.9 F (36.6 C)  TempSrc: Oral  SpO2: 98%  Weight: 193 lb 6.4 oz (87.7 kg)  Height: 5\' 7"  (1.702 m)    Physical Exam  Constitutional: She is oriented to person, place, and time. She appears well-developed and well-nourished.  HENT:  Head: Normocephalic and atraumatic.  Eyes: Conjunctivae and EOM are normal.  Cardiovascular: Normal rate, regular rhythm and normal heart sounds.  No murmur heard. Pulses:      Dorsalis pedis pulses are 2+ on the right side, and 2+ on the left side.       Posterior tibial pulses are 2+ on the right side, and 2+ on the left side.  Pulmonary/Chest: Effort normal and breath sounds normal. No stridor. No respiratory distress.  Feet:  Right Foot:  Protective Sensation: 3 sites tested. 3 sites sensed.  Skin Integrity: Negative for ulcer, blister, skin breakdown, erythema, warmth,  callus or dry skin.  Left Foot:  Protective Sensation: 3 sites tested. 3 sites sensed.  Skin Integrity: Negative for ulcer, blister, skin breakdown, erythema, warmth, callus or dry skin.  Neurological: She is alert and oriented to person, place, and time.    Assessment and Plan  Problem List Items Addressed This Visit      Cardiovascular and Mediastinum   Essential hypertension - Primary    Continue amlodipine and lisinopril. She does not regularly take hctz which was prescribed for peripheral edema. Will check lytes and cr today. Refilled amlodipine to mail order. Continue DASH diet and exercise.       Relevant Medications   amLODipine (NORVASC) 10 MG tablet   Other Relevant Orders  Comprehensive metabolic panel     Other   Diet-controlled diabetes mellitus ([place])    Gave pneumonia and flu vaccine today since pt is a diabetic. Discussed weight management and ADA diet. Will continue to monitor a1c twice a year.      Relevant Orders   Comprehensive metabolic panel   Hemoglobin A1c   Class 1 obesity due to excess calories with serious comorbidity and body mass index (BMI) of 30.0 to 30.9 in adult    Discussed more consistent exercise by adding 10 minutes of exercise daily in addition to her once weekly dance class. She should increase her exercise from there.        Other Visit Diagnoses    Need for prophylactic vaccination and inoculation against influenza       Relevant Orders   Flu Vaccine QUAD 36+ mos IM (Completed)   Need for prophylactic vaccination against Streptococcus pneumoniae (pneumococcus)       Relevant Orders   Pneumococcal conjugate vaccine 13-valent IM (Completed)   Encounter for medication monitoring       Relevant Orders   Comprehensive metabolic panel   Hemoglobin A1c   Need for hepatitis C screening test       Relevant Orders   HCV Ab w/Rflx to Verification      Emily Lopez A Nolon Rod

## 2017-05-22 ENCOUNTER — Encounter: Payer: Self-pay | Admitting: Family Medicine

## 2017-05-22 ENCOUNTER — Ambulatory Visit (INDEPENDENT_AMBULATORY_CARE_PROVIDER_SITE_OTHER): Admitting: Family Medicine

## 2017-05-22 ENCOUNTER — Other Ambulatory Visit: Payer: Self-pay

## 2017-05-22 VITALS — BP 132/84 | HR 91 | Temp 97.9°F | Resp 18 | Ht 67.0 in | Wt 193.4 lb

## 2017-05-22 DIAGNOSIS — Z5181 Encounter for therapeutic drug level monitoring: Secondary | ICD-10-CM | POA: Diagnosis not present

## 2017-05-22 DIAGNOSIS — E119 Type 2 diabetes mellitus without complications: Secondary | ICD-10-CM | POA: Insufficient documentation

## 2017-05-22 DIAGNOSIS — Z23 Encounter for immunization: Secondary | ICD-10-CM

## 2017-05-22 DIAGNOSIS — Z6830 Body mass index (BMI) 30.0-30.9, adult: Secondary | ICD-10-CM

## 2017-05-22 DIAGNOSIS — I10 Essential (primary) hypertension: Secondary | ICD-10-CM | POA: Diagnosis not present

## 2017-05-22 DIAGNOSIS — E6609 Other obesity due to excess calories: Secondary | ICD-10-CM

## 2017-05-22 DIAGNOSIS — Z1159 Encounter for screening for other viral diseases: Secondary | ICD-10-CM

## 2017-05-22 MED ORDER — AMLODIPINE BESYLATE 10 MG PO TABS: 10.0000 mg | ORAL_TABLET | Freq: Every day | ORAL | 1 refills | Status: DC

## 2017-05-22 NOTE — Assessment & Plan Note (Signed)
Continue amlodipine and lisinopril. She does not regularly take hctz which was prescribed for peripheral edema. Will check lytes and cr today. Refilled amlodipine to mail order. Continue DASH diet and exercise.

## 2017-05-22 NOTE — Patient Instructions (Addendum)
   IF you received an x-ray today, you will receive an invoice from [facility]. Please contact [facility] at [phone] with questions or concerns regarding your invoice.   IF you received labwork today, you will receive an invoice from [facility]. Please contact [facility] at [phone] with questions or concerns regarding your invoice.   Our billing staff will not be able to assist you with questions regarding bills from these companies.  You will be contacted with the lab results as soon as they are available. The fastest way to get your results is to activate your My Chart account. Instructions are located on the last page of this paperwork. If you have not heard from us regarding the results in 2 weeks, please contact this office.     Diabetes Mellitus and Exercise Exercising regularly is important for your overall health, especially when you have diabetes (diabetes mellitus). Exercising is not only about losing weight. It has many health benefits, such as increasing muscle strength and bone density and reducing body fat and stress. This leads to improved fitness, flexibility, and endurance, all of which result in better overall health. Exercise has additional benefits for people with diabetes, including:  Reducing appetite.  Helping to lower and control blood glucose.  Lowering blood pressure.  Helping to control amounts of fatty substances (lipids) in the blood, such as cholesterol and triglycerides.  Helping the body to respond better to insulin (improving insulin sensitivity).  Reducing how much insulin the body needs.  Decreasing the risk for heart disease by: ? Lowering cholesterol and triglyceride levels. ? Increasing the levels of good cholesterol. ? Lowering blood glucose levels.  What is my activity plan? Your health care provider or certified diabetes educator can help you make a plan for the type and frequency of exercise (activity plan) that  works for you. Make sure that you:  Do at least 150 minutes of moderate-intensity or vigorous-intensity exercise each week. This could be brisk walking, biking, or water aerobics. ? Do stretching and strength exercises, such as yoga or weightlifting, at least 2 times a week. ? Spread out your activity over at least 3 days of the week.  Get some form of physical activity every day. ? Do not go more than 2 days in a row without some kind of physical activity. ? Avoid being inactive for more than 90 minutes at a time. Take frequent breaks to walk or stretch.  Choose a type of exercise or activity that you enjoy, and set realistic goals.  Start slowly, and gradually increase the intensity of your exercise over time.  What do I need to know about managing my diabetes?  Check your blood glucose before and after exercising. ? If your blood glucose is higher than 240 mg/dL (13.3 mmol/L) before you exercise, check your urine for ketones. If you have ketones in your urine, do not exercise until your blood glucose returns to normal.  Know the symptoms of low blood glucose (hypoglycemia) and how to treat it. Your risk for hypoglycemia increases during and after exercise. Common symptoms of hypoglycemia can include: ? Hunger. ? Anxiety. ? Sweating and feeling clammy. ? Confusion. ? Dizziness or feeling light-headed. ? Increased heart rate or palpitations. ? Blurry vision. ? Tingling or numbness around the mouth, lips, or tongue. ? Tremors or shakes. ? Irritability.  Keep a rapid-acting carbohydrate snack available before, during, and after exercise to help prevent or treat hypoglycemia.  Avoid injecting insulin into areas of the body   that are going to be exercised. For example, avoid injecting insulin into: ? The arms, when playing tennis. ? The legs, when jogging.  Keep records of your exercise habits. Doing this can help you and your health care provider adjust your diabetes management plan  as needed. Write down: ? Food that you eat before and after you exercise. ? Blood glucose levels before and after you exercise. ? The type and amount of exercise you have done. ? When your insulin is expected to peak, if you use insulin. Avoid exercising at times when your insulin is peaking.  When you start a new exercise or activity, work with your health care provider to make sure the activity is safe for you, and to adjust your insulin, medicines, or food intake as needed.  Drink plenty of water while you exercise to prevent dehydration or heat stroke. Drink enough fluid to keep your urine clear or pale yellow. This information is not intended to replace advice given to you by your health care provider. Make sure you discuss any questions you have with your health care provider. Document Released: 08/17/2003 Document Revised: 12/15/2015 Document Reviewed: 11/06/2015 Elsevier Interactive Patient Education  2018 [employer]  

## 2017-05-22 NOTE — Assessment & Plan Note (Signed)
Discussed more consistent exercise by adding 10 minutes of exercise daily in addition to her once weekly dance class. She should increase her exercise from there.

## 2017-05-22 NOTE — Assessment & Plan Note (Signed)
Gave pneumonia and flu vaccine today since pt is a diabetic. Discussed weight management and ADA diet. Will continue to monitor a1c twice a year.

## 2017-05-23 LAB — COMPREHENSIVE METABOLIC PANEL
A/G RATIO: 1.5 (ref 1.2–2.2)
ALBUMIN: 4.5 g/dL (ref 3.6–4.8)
ALT: 17 IU/L (ref 0–32)
AST: 23 IU/L (ref 0–40)
Alkaline Phosphatase: 67 IU/L (ref 39–117)
BILIRUBIN TOTAL: 0.5 mg/dL (ref 0.0–1.2)
BUN / CREAT RATIO: 15 (ref 12–28)
BUN: 17 mg/dL (ref 8–27)
CHLORIDE: 104 mmol/L (ref 96–106)
CO2: 25 mmol/L (ref 20–29)
Calcium: 9.5 mg/dL (ref 8.7–10.3)
Creatinine, Ser: 1.11 mg/dL — ABNORMAL HIGH (ref 0.57–1.00)
GFR, EST AFRICAN AMERICAN: 62 mL/min/{1.73_m2} (ref >59)
GFR, EST NON AFRICAN AMERICAN: 54 mL/min/{1.73_m2} — AB (ref >59)
GLOBULIN, TOTAL: 3.1 g/dL (ref 1.5–4.5)
Glucose: 108 mg/dL — ABNORMAL HIGH (ref 65–99)
POTASSIUM: 4.2 mmol/L (ref 3.5–5.2)
Sodium: 142 mmol/L (ref 134–144)
TOTAL PROTEIN: 7.6 g/dL (ref 6.0–8.5)

## 2017-05-23 LAB — HEMOGLOBIN A1C
Est. average glucose Bld gHb Est-mCnc: 146 mg/dL
Hgb A1c MFr Bld: 6.7 % — ABNORMAL HIGH (ref 4.8–5.6)

## 2017-05-23 LAB — HCV INTERPRETATION

## 2017-05-23 LAB — HCV AB W/RFLX TO VERIFICATION: HCV Ab: 0.1 s/co ratio (ref 0.0–0.9)

## 2017-08-13 ENCOUNTER — Encounter (HOSPITAL_COMMUNITY): Payer: Self-pay | Admitting: Emergency Medicine

## 2017-08-13 ENCOUNTER — Other Ambulatory Visit: Payer: Self-pay

## 2017-08-13 ENCOUNTER — Ambulatory Visit (HOSPITAL_COMMUNITY)
Admission: EM | Admit: 2017-08-13 | Discharge: 2017-08-13 | Disposition: A | Discharge status: Home / Self Care | Payer: Self-pay | Attending: Family Medicine | Admitting: Family Medicine

## 2017-08-13 ENCOUNTER — Ambulatory Visit (INDEPENDENT_AMBULATORY_CARE_PROVIDER_SITE_OTHER): Payer: Self-pay

## 2017-08-13 DIAGNOSIS — S29019A Strain of muscle and tendon of unspecified wall of thorax, initial encounter: Secondary | ICD-10-CM

## 2017-08-13 DIAGNOSIS — S161XXA Strain of muscle, fascia and tendon at neck level, initial encounter: Secondary | ICD-10-CM

## 2017-08-13 DIAGNOSIS — S22060D Wedge compression fracture of T7-T8 vertebra, subsequent encounter for fracture with routine healing: Secondary | ICD-10-CM

## 2017-08-13 MED ORDER — METHOCARBAMOL 500 MG PO TABS: 500.0000 mg | ORAL_TABLET | Freq: Four times a day (QID) | ORAL | 0 refills | Status: DC | PRN

## 2017-08-13 MED ORDER — NAPROXEN 500 MG PO TABS: 500.0000 mg | ORAL_TABLET | Freq: Two times a day (BID) | ORAL | 0 refills | Status: DC
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
## 2017-08-14 ENCOUNTER — Encounter: Admitting: Family Medicine

## 2017-08-14 ENCOUNTER — Other Ambulatory Visit: Payer: Self-pay

## 2017-08-14 ENCOUNTER — Ambulatory Visit (INDEPENDENT_AMBULATORY_CARE_PROVIDER_SITE_OTHER): Admitting: Family Medicine

## 2017-08-14 ENCOUNTER — Encounter: Payer: Self-pay | Admitting: Family Medicine

## 2017-08-14 VITALS — BP 151/95 | HR 86 | Temp 98.6°F | Resp 17 | Ht 67.0 in | Wt 191.4 lb

## 2017-08-14 DIAGNOSIS — E119 Type 2 diabetes mellitus without complications: Secondary | ICD-10-CM

## 2017-08-14 DIAGNOSIS — Z1231 Encounter for screening mammogram for malignant neoplasm of breast: Secondary | ICD-10-CM | POA: Diagnosis not present

## 2017-08-14 DIAGNOSIS — Z124 Encounter for screening for malignant neoplasm of cervix: Secondary | ICD-10-CM | POA: Diagnosis not present

## 2017-08-14 DIAGNOSIS — Z7189 Other specified counseling: Secondary | ICD-10-CM

## 2017-08-14 DIAGNOSIS — Z0000 Encounter for general adult medical examination without abnormal findings: Secondary | ICD-10-CM

## 2017-08-14 DIAGNOSIS — Z1239 Encounter for other screening for malignant neoplasm of breast: Secondary | ICD-10-CM

## 2017-08-14 LAB — LIPID PANEL
CHOL/HDL RATIO: 2.1 ratio (ref 0.0–4.4)
Cholesterol, Total: 184 mg/dL (ref 100–199)
HDL: 86 mg/dL (ref >39)
LDL CALC: 83 mg/dL (ref 0–99)
Triglycerides: 73 mg/dL (ref 0–149)
VLDL CHOLESTEROL CAL: 15 mg/dL (ref 5–40)

## 2017-08-14 LAB — COMPREHENSIVE METABOLIC PANEL
ALT: 25 IU/L (ref 0–32)
AST: 27 IU/L (ref 0–40)
Albumin/Globulin Ratio: 1.3 (ref 1.2–2.2)
Albumin: 4.4 g/dL (ref 3.6–4.8)
Alkaline Phosphatase: 74 IU/L (ref 39–117)
BUN/Creatinine Ratio: 14 (ref 12–28)
BUN: 16 mg/dL (ref 8–27)
Bilirubin Total: 0.4 mg/dL (ref 0.0–1.2)
CO2: 23 mmol/L (ref 20–29)
CREATININE: 1.13 mg/dL — AB (ref 0.57–1.00)
Calcium: 9.8 mg/dL (ref 8.7–10.3)
Chloride: 104 mmol/L (ref 96–106)
GFR, EST AFRICAN AMERICAN: 61 mL/min/{1.73_m2} (ref >59)
GFR, EST NON AFRICAN AMERICAN: 53 mL/min/{1.73_m2} — AB (ref >59)
GLOBULIN, TOTAL: 3.5 g/dL (ref 1.5–4.5)
GLUCOSE: 105 mg/dL — AB (ref 65–99)
Potassium: 4.2 mmol/L (ref 3.5–5.2)
SODIUM: 144 mmol/L (ref 134–144)
TOTAL PROTEIN: 7.9 g/dL (ref 6.0–8.5)

## 2017-08-14 NOTE — Progress Notes (Signed)
Chief Complaint  Patient presents with  . Annual Exam    pap    Subjective:  Emily Lopez is a 62 y.o. female here for a health maintenance visit.  Patient is established pt  Patient Active Problem List   Diagnosis Date Noted  . Diet-controlled diabetes mellitus ([place]) 05/22/2017  . Class 1 obesity due to excess calories with serious comorbidity and body mass index (BMI) of 30.0 to 30.9 in adult 05/22/2017  . Right leg swelling 10/21/2016  . VITAMIN D DEFICIENCY 03/13/2009  . DEPRESSIVE DISORDER 08/24/2008  . OVERWEIGHT 07/25/2008  . ATTENTION DEFICIT DISORDER, ADULT 07/25/2008  . SYSTOLIC MURMUR 19/14/7829  . GERD 07/20/2007  . ANEMIA, IRON DEFICIENCY, UNSPEC. 08/07/2006  . Essential hypertension 08/07/2006    Past Medical History:  Diagnosis Date  . Anxiety   . Arthritis   . Attention deficit disorder without mention of hyperactivity   . Depression   . Esophageal reflux   . Hypertension   . Iron deficiency anemia, unspecified   . Myalgia and myositis, unspecified   . Pneumonia    2008 or 2009  . Undiagnosed cardiac murmurs   . Unspecified vitamin D deficiency     No past surgical history on file.   Outpatient Medications Prior to Visit  Medication Sig Dispense Refill  . amLODipine (NORVASC) 10 MG tablet Take 1 tablet (10 mg total) by mouth daily. 90 tablet 1  . aspirin 81 MG EC tablet Take 1 tablet (81 mg total) by mouth daily. Swallow whole. 90 tablet 3  . GARLIC PO Take 1 tablet by mouth.    Marland Kitchen lisinopril (PRINIVIL,ZESTRIL) 20 MG tablet Take 1 tablet (20 mg total) by mouth daily. 90 tablet 1  . methocarbamol (ROBAXIN) 500 MG tablet Take 1 tablet (500 mg total) by mouth every 6 (six) hours as needed for muscle spasms. 20 tablet 0  . Multiple Vitamin (MULTIVITAMIN) LIQD Take 5 mLs by mouth daily. I[place]  Nutri burst Liquid vitamin    . naproxen (NAPROSYN) 500 MG tablet Take 1 tablet (500 mg total) by mouth 2 (two) times daily. 30 tablet 0  . Elastic Bandages &  Supports (MEDICAL COMPRESSION STOCKINGS) MISC Medium compression 25-35 mmHg (Patient not taking: Reported on 05/22/2017) 1 each 1  . hydrochlorothiazide (HYDRODIURIL) 25 MG tablet Take 1 tablet (25 mg total) by mouth daily. 90 tablet 1  . Lactobacillus (PROBIOTIC ACIDOPHILUS) TABS Take 1 tablet by mouth.    . meloxicam (MOBIC) 7.5 MG tablet Take 1 tablet (7.5 mg total) by mouth daily. (Patient not taking: Reported on 05/22/2017) 20 tablet 0   No facility-administered medications prior to visit.     Allergies  Allergen Reactions  . No Known Allergies      Family History  Problem Relation Age of Onset  . Hypertension Mother   . Diabetes Mother   . Arthritis Mother   . Hyperlipidemia Mother   . Lung cancer Father   . Heart attack Father   . Diabetes Brother   . Hypertension Sister      Health Habits: Dental Exam: up to date Eye Exam: NOT up to date Exercise: 7 times/week on average Current exercise activities: walking Diet: ADA  Social History   Socioeconomic History  . Marital status: Married    Spouse name: Not on file  . Number of children: Not on file  . Years of education: Not on file  . Highest education level: Not on file  Social Needs  . Emergency planning/management officer  strain: Not on file  . Food insecurity - worry: Not on file  . Food insecurity - inability: Not on file  . Transportation needs - medical: Not on file  . Transportation needs - non-medical: Not on file  Occupational History  . Not on file  Tobacco Use  . Smoking status: Never Smoker  . Smokeless tobacco: Former Systems developer    Types: Snuff  Substance and Sexual Activity  . Alcohol use: Yes    Alcohol/week: 0.0 oz    Comment: 2 drinks  . Drug use: No  . Sexual activity: No    Birth control/protection: None  Other Topics Concern  . Not on file  Social History Narrative  . Not on file   Social History   Substance and Sexual Activity  Alcohol Use Yes  . Alcohol/week: 0.0 oz   Comment: 2 drinks    Social History   Tobacco Use  Smoking Status Never Smoker  Smokeless Tobacco Former Systems developer  . Types: Snuff   Social History   Substance and Sexual Activity  Drug Use No    GYN: Sexual Health Menstrual status: regular menses LMP: No LMP recorded. Patient is postmenopausal. Last pap smear: see HM section History of abnormal pap smears:  Sexually active: not currently Current contraception: none  Health Maintenance: See under health Maintenance activity for review of completion dates as well. Immunization History  Administered Date(s) Administered  . Hepatitis A, Adult 12/24/2013  . Hepatitis B 01/25/2014  . Hepatitis B, adult 12/24/2013  . Influenza Split 01/25/2014  . Influenza,inj,Quad PF,6+ Mos 05/22/2017  . PPD Test 07/01/2015  . Pneumococcal Conjugate-13 05/22/2017  . Tdap 12/24/2013      Depression Screen-PHQ2/9 Depression screen [facility] 2/9 08/14/2017 05/22/2017 09/27/2016 09/20/2016 10/16/2015  Decreased Interest 0 0 0 0 0  Down, Depressed, Hopeless 0 0 0 0 0  PHQ - 2 Score 0 0 0 0 0       Depression Severity and Treatment Recommendations:  0-4= None  5-9= Mild / Treatment: Support, educate to call if worse; return in one month  10-14= Moderate / Treatment: Support, watchful waiting; Antidepressant or Psycotherapy  15-19= Moderately severe / Treatment: Antidepressant OR Psychotherapy  >= 20 = Major depression, severe / Antidepressant AND Psychotherapy    Review of Systems   Review of Systems  Constitutional: Negative for chills, fever, malaise/fatigue and weight loss.  HENT: Negative for ear discharge, ear pain, hearing loss, nosebleeds and tinnitus.   Eyes: Negative for blurred vision and double vision.  Respiratory: Negative for cough, hemoptysis, sputum production, shortness of breath and wheezing.   Cardiovascular: Negative for chest pain, palpitations, orthopnea, claudication and leg swelling.  Gastrointestinal: Negative for abdominal pain, blood in  stool, constipation, diarrhea, nausea and vomiting.  Genitourinary: Negative for dysuria, frequency, hematuria and urgency.  Musculoskeletal: Negative for back pain, myalgias and neck pain.  Skin: Negative for itching and rash.  Neurological: Negative for dizziness, tingling, tremors, sensory change, speech change, focal weakness, seizures, loss of consciousness and headaches.  Psychiatric/Behavioral: Negative for depression, hallucinations, substance abuse and suicidal ideas. The patient is not nervous/anxious.     See HPI for ROS as well.    Objective:   Vitals:   08/14/17 0956  BP: (!) 151/95  Pulse: 86  Resp: 17  Temp: 98.6 F (37 C)  TempSrc: Oral  SpO2: 98%  Weight: 191 lb 6.4 oz (86.8 kg)  Height: 5\' 7"  (1.702 m)    Body mass index is 29.98 kg/m.  Physical Exam  BP (!) 151/95 (BP Location: Right Arm, Patient Position: Sitting, Cuff Size: Normal)   Pulse 86   Temp 98.6 F (37 C) (Oral)   Resp 17   Ht 5\' 7"  (1.702 m)   Wt 191 lb 6.4 oz (86.8 kg)   SpO2 98%   BMI 29.98 kg/m   General Appearance:    Alert, cooperative, no distress, appears stated age  Head:    Normocephalic, without obvious abnormality, atraumatic  Eyes:    PERRL, conjunctiva/corneas clear, EOM's intact, fundi    benign, both eyes  Ears:    Normal TM's and external ear canals, both ears  Nose:   Nares normal, septum midline, mucosa normal, no drainage    or sinus tenderness  Throat:   Lips, mucosa, and tongue normal; teeth and gums normal  Neck:   Supple, symmetrical, trachea midline, no adenopathy;    thyroid:  no enlargement/tenderness/nodules; no carotid   bruit or JVD  Back:     Symmetric, no curvature, ROM normal, no CVA tenderness  Lungs:     Clear to auscultation bilaterally, respirations unlabored  Chest Wall:    No tenderness or deformity   Heart:    Regular rate and rhythm, S1 and S2 normal, no murmur, rub   or gallop  Breast Exam:    Chaperone present No tenderness, masses, or  nipple abnormality  Abdomen:     Soft, non-tender, bowel sounds active all four quadrants,    no masses, no organomegaly  Genitalia:    Chaperone present. Normal female without lesion, discharge or tenderness, pap smear performed, bimanual exam reveals normal ovaries, uterus nontender, midline  Extremities:   Extremities normal, atraumatic, no cyanosis or edema  Pulses:   2+ and symmetric all extremities  Skin:   Skin color, texture, turgor normal, no rashes or lesions  Lymph nodes:   Cervical, supraclavicular, and axillary nodes normal  Neurologic:   CNII-XII intact, normal strength, sensation and reflexes    throughout      Assessment/Plan:   Patient was seen for a health maintenance exam.  Counseled the patient on health maintenance issues. Reviewed her health mainteance schedule and ordered appropriate tests (see orders.) Counseled on regular exercise and weight management. Recommend regular eye exams and dental cleaning.   The following issues were addressed today for health maintenance:   Jaci was seen today for annual exam.  Diagnoses and all orders for this visit:  Encounter for health maintenance examination in adult- Age appropriate screenings reviewed   Encounter for Papanicolaou smear for cervical cancer screening -     Pap IG, CT/NG NAA, and HPV (high risk) [facility]  Breast self examination education, encounter for -     MM Digital Screening; Future  Screening for breast cancer -     MM Digital Screening; Future  Well controlled diabetes mellitus ([place]) -     Comprehensive metabolic panel -     Lipid panel  Other orders -     Cancel: Pneumococcal conjugate vaccine 13-valent IM    No Follow-up on file.    Body mass index is 29.98 kg/m.:  Discussed the patient's BMI with patient. The BMI body mass index is 29.98 kg/m.     No future appointments.  Patient Instructions       IF you received an x-ray today, you will receive an invoice from  [facility]. Please contact [facility] at [phone] with questions or concerns regarding your invoice.   IF  you received labwork today, you will receive an invoice from [facility]. Please contact [facility] at [phone] with questions or concerns regarding your invoice.   Our billing staff will not be able to assist you with questions regarding bills from these companies.  You will be contacted with the lab results as soon as they are available. The fastest way to get your results is to activate your My Chart account. Instructions are located on the last page of this paperwork. If you have not heard from [place] regarding the results in 2 weeks, please contact this office.

## 2017-08-14 NOTE — Patient Instructions (Addendum)
IF you received an x-ray today, you will receive an invoice from [facility]. Please contact [facility] at [phone] with questions or concerns regarding your invoice.   IF you received labwork today, you will receive an invoice from [place]. Please contact [facility] at [phone] with questions or concerns regarding your invoice.   Our billing staff will not be able to assist you with questions regarding bills from these companies.  You will be contacted with the lab results as soon as they are available. The fastest way to get your results is to activate your My Chart account. Instructions are located on the last page of this paperwork. If you have not heard from [place] regarding the results in 2 weeks, please contact this office.     Breast Self-Awareness Breast self-awareness means:  Knowing how your breasts look.  Knowing how your breasts feel.  Checking your breasts every month for changes.  Telling your doctor if you notice a change in your breasts.  Breast self-awareness allows you to notice a breast problem early while it is still small. How to do a breast self-exam One way to learn what is normal for your breasts and to check for changes is to do a breast self-exam. To do a breast self-exam: Look for Changes  1. Take off all the clothes above your waist. 2. Stand in front of a mirror in a room with good lighting. 3. Put your hands on your hips. 4. Push your hands down. 5. Look at your breasts and nipples in the mirror to see if one breast or nipple looks different than the other. Check to see if: ? The shape of one breast is different. ? The size of one breast is different. ? There are wrinkles, dips, and bumps in one breast and not the other. 6. Look at each breast for changes in your skin, such as: ? Redness. ? Scaly areas. 7. Look for changes in your nipples, such as: ? Liquid around the nipples. ? Bleeding. ? Dimpling. ? Redness. ? A  change in where the nipples are. Feel for Changes 1. Lie on your back on the floor. 2. Feel each breast. To do this, follow these steps: ? Pick a breast to feel. ? Put the arm closest to that breast above your head. ? Use your other arm to feel the nipple area of your breast. Feel the area with the pads of your three middle fingers by making small circles with your fingers. For the first circle, press lightly. For the second circle, press harder. For the third circle, press even harder. ? Keep making circles with your fingers at the light, harder, and even harder pressures as you move down your breast. Stop when you feel your ribs. ? Move your fingers a little toward the center of your body. ? Start making circles with your fingers again, this time going up until you reach your collarbone. ? Keep making up and down circles until you reach your armpit. Remember to keep using the three pressures. ? Feel the other breast in the same way. 3. Sit or stand in the shower or tub. 4. With soapy water on your skin, feel each breast the same way you did in step 2, when you were lying on the floor. Write Down What You Find  After doing the self-exam, write down:  What is normal for each breast.  Any changes you find in each breast.  When you last had your period.  How often should I check my breasts? Check your breasts every month. If you are breastfeeding, the best time to check them is after you feed your baby or after you use a breast pump. If you get periods, the best time to check your breasts is 5-7 days after your period is over. When should I see my doctor? See your doctor if you notice:  A change in shape or size of your breasts or nipples.  A change in the skin of your breast or nipples, such as red or scaly skin.  Unusual fluid coming from your nipples.  A lump or thick area that was not there before.  Pain in your breasts.  Anything that concerns you.  This information is not  intended to replace advice given to you by your health care provider. Make sure you discuss any questions you have with your health care provider. Document Released: 11/13/2007 Document Revised: 11/02/2015 Document Reviewed: 04/16/2015 Elsevier Interactive Patient Education  [employer].

## 2017-08-18 LAB — PAP IG, CT-NG NAA, HPV HIGH-RISK
Chlamydia, Nuc. Acid Amp: NEGATIVE
Gonococcus by Nucleic Acid Amp: NEGATIVE
HPV, HIGH-RISK: NEGATIVE
PAP Smear Comment: 0

## 2017-08-19 NOTE — Progress Notes (Deleted)
  No chief complaint on file.   HPI  4 review of systems  Past Medical History:  Diagnosis Date  . Anxiety   . Arthritis   . Attention deficit disorder without mention of hyperactivity   . Depression   . Esophageal reflux   . Hypertension   . Iron deficiency anemia, unspecified   . Myalgia and myositis, unspecified   . Pneumonia    2008 or 2009  . Undiagnosed cardiac murmurs   . Unspecified vitamin D deficiency     Current Outpatient Medications  Medication Sig Dispense Refill  . amLODipine (NORVASC) 10 MG tablet Take 1 tablet (10 mg total) by mouth daily. 90 tablet 1  . aspirin 81 MG EC tablet Take 1 tablet (81 mg total) by mouth daily. Swallow whole. 90 tablet 3  . GARLIC PO Take 1 tablet by mouth.    Marland Kitchen lisinopril (PRINIVIL,ZESTRIL) 20 MG tablet Take 1 tablet (20 mg total) by mouth daily. 90 tablet 1  . methocarbamol (ROBAXIN) 500 MG tablet Take 1 tablet (500 mg total) by mouth every 6 (six) hours as needed for muscle spasms. 20 tablet 0  . Multiple Vitamin (MULTIVITAMIN) LIQD Take 5 mLs by mouth daily. Iaso  Nutri burst Liquid vitamin    . naproxen (NAPROSYN) 500 MG tablet Take 1 tablet (500 mg total) by mouth 2 (two) times daily. 30 tablet 0   No current facility-administered medications for this visit.     Allergies:  Allergies  Allergen Reactions  . No Known Allergies     No past surgical history on file.  Social History   Socioeconomic History  . Marital status: Married    Spouse name: Not on file  . Number of children: Not on file  . Years of education: Not on file  . Highest education level: Not on file  Social Needs  . Financial resource strain: Not on file  . Food insecurity - worry: Not on file  . Food insecurity - inability: Not on file  . Transportation needs - medical: Not on file  . Transportation needs - non-medical: Not on file  Occupational History  . Not on file  Tobacco Use  . Smoking status: Never Smoker  . Smokeless tobacco: Former  Systems developer    Types: Snuff  Substance and Sexual Activity  . Alcohol use: Yes    Alcohol/week: 0.0 oz    Comment: 2 drinks  . Drug use: No  . Sexual activity: No    Birth control/protection: None  Other Topics Concern  . Not on file  Social History Narrative  . Not on file    Family History  Problem Relation Age of Onset  . Hypertension Mother   . Diabetes Mother   . Arthritis Mother   . Hyperlipidemia Mother   . Lung cancer Father   . Heart attack Father   . Diabetes Brother   . Hypertension Sister      ROS Review of Systems See HPI Constitution: No fevers or chills No malaise No diaphoresis Skin: No rash or itching Eyes: no blurry vision, no double vision GU: no dysuria or hematuria Neuro: no dizziness or headaches * all others reviewed and negative   Objective: There were no vitals filed for this visit.  Physical Exam  Assessment and Plan There are no diagnoses linked to this encounter.   Shatara Stanek P [employer]

## 2017-08-20 ENCOUNTER — Ambulatory Visit: Admitting: Family Medicine

## 2017-08-21 ENCOUNTER — Ambulatory Visit (INDEPENDENT_AMBULATORY_CARE_PROVIDER_SITE_OTHER): Admitting: Family Medicine

## 2017-08-21 ENCOUNTER — Encounter: Payer: Self-pay | Admitting: Family Medicine

## 2017-08-21 ENCOUNTER — Other Ambulatory Visit: Payer: Self-pay

## 2017-08-21 VITALS — BP 170/98 | HR 66 | Temp 98.6°F | Resp 12 | Ht 66.54 in | Wt 192.4 lb

## 2017-08-21 DIAGNOSIS — G44209 Tension-type headache, unspecified, not intractable: Secondary | ICD-10-CM

## 2017-08-21 DIAGNOSIS — I10 Essential (primary) hypertension: Secondary | ICD-10-CM | POA: Diagnosis not present

## 2017-08-21 DIAGNOSIS — S22000S Wedge compression fracture of unspecified thoracic vertebra, sequela: Secondary | ICD-10-CM | POA: Diagnosis not present

## 2017-08-21 DIAGNOSIS — M791 Myalgia, unspecified site: Secondary | ICD-10-CM | POA: Diagnosis not present

## 2017-08-21 MED ORDER — MELOXICAM 15 MG PO TABS: 15.0000 mg | ORAL_TABLET | Freq: Every day | ORAL | 0 refills | Status: DC

## 2017-08-21 MED ORDER — TRAMADOL HCL 50 MG PO TABS: 50.0000 mg | ORAL_TABLET | Freq: Two times a day (BID) | ORAL | 0 refills | Status: DC
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
## 2017-08-21 NOTE — Patient Instructions (Addendum)
Emily Lopez, [place] Phone: [phone]   Take your amlodipine right away    IF you received an x-ray today, you will receive an invoice from [facility]. Please contact [facility]iology at [phone] with questions or concerns regarding your invoice.   IF you received labwork today, you will receive an invoice from [place]. Please contact [facility] at [phone] with questions or concerns regarding your invoice.   Our billing staff will not be able to assist you with questions regarding bills from these companies.  You will be contacted with the lab results as soon as they are available. The fastest way to get your results is to activate your My Chart account. Instructions are located on the last page of this paperwork. If you have not heard from [place] regarding the results in 2 weeks, please contact this office.      Tension Headache A tension headache is a feeling of pain, pressure, or aching that is often felt over the front and sides of the head. The pain can be dull, or it can feel tight (constricting). Tension headaches are not normally associated with nausea or vomiting, and they do not get worse with physical activity. Tension headaches can last from 30 minutes to several days. This is the most common type of headache. CAUSES The exact cause of this condition is not known. Tension headaches often begin after stress, anxiety, or depression. Other triggers may include:  Alcohol.  Too much caffeine, or caffeine withdrawal.  Respiratory infections, such as colds, flu, or sinus infections.  Dental problems or teeth clenching.  Fatigue.  Holding your head and neck in the same position for a long period of time, such as while using a computer.  Smoking. SYMPTOMS Symptoms of this condition include:  A feeling of pressure around the head.  Dull, aching head pain.  Pain felt over the front and sides of the head.  Tenderness in the  muscles of the head, neck, and shoulders. DIAGNOSIS This condition may be diagnosed based on your symptoms and a physical exam. Tests may be done, such as a CT scan or an MRI of your head. These tests may be done if your symptoms are severe or unusual. TREATMENT This condition may be treated with lifestyle changes and medicines to help relieve symptoms. HOME CARE INSTRUCTIONS Managing Pain  Take over-the-counter and prescription medicines only as told by your health care provider.  Lie down in a dark, quiet room when you have a headache.  If directed, apply ice to the head and neck area: ? Put ice in a plastic bag. ? Place a towel between your skin and the bag. ? Leave the ice on for 20 minutes, 2-3 times per day.  Use a heating pad or a hot shower to apply heat to the head and neck area as told by your health care provider. Eating and Drinking  Eat meals on a regular schedule.  Limit alcohol use.  Decrease your caffeine intake, or stop using caffeine. General Instructions  Keep all follow-up visits as told by your health care provider. This is important.  Keep a headache journal to help find out what may trigger your headaches. For example, write down: ? What you eat and drink. ? How much sleep you get. ? Any change to your diet or medicines.  Try massage or other relaxation techniques.  Limit stress.  Sit up straight, and avoid tensing your muscles.  Do not use tobacco products, including cigarettes,  chewing tobacco, or e-cigarettes. If you need help quitting, ask your health care provider.  Exercise regularly as told by your health care provider.  Get 7-9 hours of sleep, or the amount recommended by your health care provider. SEEK MEDICAL CARE IF:  Your symptoms are not helped by medicine.  You have a headache that is different from what you normally experience.  You have nausea or you vomit.  You have a fever. SEEK IMMEDIATE MEDICAL CARE IF:  Your headache  becomes severe.  You have repeated vomiting.  You have a stiff neck.  You have a loss of vision.  You have problems with speech.  You have pain in your eye or ear.  You have muscular weakness or loss of muscle control.  You lose your balance or you have trouble walking.  You feel faint or you pass out.  You have confusion. This information is not intended to replace advice given to you by your health care provider. Make sure you discuss any questions you have with your health care provider. Document Released: 05/27/2005 Document Revised: 02/15/2015 Document Reviewed: 09/19/2014 Elsevier Interactive Patient Education  2017 [employer].

## 2017-08-29 ENCOUNTER — Telehealth: Payer: Self-pay | Admitting: Family Medicine

## 2017-08-29 ENCOUNTER — Encounter: Payer: Self-pay | Admitting: *Deleted

## 2017-08-29 DIAGNOSIS — M5090 Cervical disc disorder, unspecified, unspecified cervical region: Secondary | ICD-10-CM

## 2017-08-29 NOTE — Telephone Encounter (Signed)
This encounter was created in error - please disregard.
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
## 2017-08-30 ENCOUNTER — Other Ambulatory Visit: Payer: Self-pay | Admitting: Family Medicine

## 2017-08-30 DIAGNOSIS — I10 Essential (primary) hypertension: Secondary | ICD-10-CM

## 2017-09-04 NOTE — Telephone Encounter (Signed)
Pt calling for Orthopedic referral

## 2017-09-04 NOTE — Telephone Encounter (Signed)
Pt. Called last Friday and has not heard anything back  Please call

## 2017-09-06 NOTE — Addendum Note (Signed)
Addended by: Delia Chimes A on: 09/06/2017 10:39 AM   Modules accepted: Orders

## 2017-09-22 ENCOUNTER — Encounter: Admitting: Family Medicine

## 2018-01-02 ENCOUNTER — Ambulatory Visit

## 2018-03-27 LAB — HM MAMMOGRAPHY: HM MAMMO: NORMAL (ref 0–4)

## 2018-04-13 ENCOUNTER — Encounter (HOSPITAL_COMMUNITY): Payer: Self-pay | Admitting: *Deleted

## 2018-04-13 ENCOUNTER — Ambulatory Visit (HOSPITAL_COMMUNITY)
Admission: EM | Admit: 2018-04-13 | Discharge: 2018-04-13 | Disposition: A | Discharge status: Home / Self Care | Attending: Family Medicine | Admitting: Family Medicine

## 2018-04-13 ENCOUNTER — Other Ambulatory Visit: Payer: Self-pay

## 2018-04-13 DIAGNOSIS — I10 Essential (primary) hypertension: Secondary | ICD-10-CM

## 2018-04-13 DIAGNOSIS — M79645 Pain in left finger(s): Secondary | ICD-10-CM | POA: Diagnosis not present

## 2018-04-13 DIAGNOSIS — M654 Radial styloid tenosynovitis [de Quervain]: Secondary | ICD-10-CM | POA: Diagnosis not present

## 2018-04-13 MED ORDER — MELOXICAM 7.5 MG PO TABS: 7.5000 mg | ORAL_TABLET | Freq: Every day | ORAL | 0 refills | Status: DC

## 2018-04-13 NOTE — ED Provider Notes (Signed)
[place]    CSN: [id number] Arrival date & time: 04/13/18  1843     History   Chief Complaint Chief Complaint  Patient presents with  . Hand Pain    HPI Emily Lopez is a 62 y.o. female.   62 year old female comes in for 1 week history of left thumb pain.  Denies injury/trauma.  Pain started gradually, worse with movement.  Pain is aching in sensation.  States slight decrease in range of motion due to pain.  Denies numbness, tingling.  Has not taken anything for the symptoms.     Past Medical History:  Diagnosis Date  . Anxiety   . Arthritis   . Attention deficit disorder without mention of hyperactivity   . Depression   . Esophageal reflux   . Hypertension   . Iron deficiency anemia, unspecified   . Myalgia and myositis, unspecified   . Pneumonia    2008 or 2009  . Undiagnosed cardiac murmurs   . Unspecified vitamin D deficiency     Patient Active Problem List   Diagnosis Date Noted  . Diet-controlled diabetes mellitus ([place]) 05/22/2017  . Class 1 obesity due to excess calories with serious comorbidity and body mass index (BMI) of 30.0 to 30.9 in adult 05/22/2017  . Right leg swelling 10/21/2016  . VITAMIN D DEFICIENCY 03/13/2009  . DEPRESSIVE DISORDER 08/24/2008  . OVERWEIGHT 07/25/2008  . ATTENTION DEFICIT DISORDER, ADULT 07/25/2008  . SYSTOLIC MURMUR 95/63/8756  . GERD 07/20/2007  . ANEMIA, IRON DEFICIENCY, UNSPEC. 08/07/2006  . Essential hypertension 08/07/2006    History reviewed. No pertinent surgical history.  OB History   None      Home Medications    Prior to Admission medications   Medication Sig Start Date End Date Taking? Authorizing Provider  amLODipine (NORVASC) 10 MG tablet Take 1 tablet (10 mg total) by mouth daily. 05/22/17   Forrest Moron, MD  aspirin 81 MG EC tablet Take 1 tablet (81 mg total) by mouth daily. Swallow whole. 09/27/16   Forrest Moron, MD  GARLIC PO Take 1 tablet by mouth.    [provider]  lisinopril (PRINIVIL,ZESTRIL) 20 MG tablet Take 1 tablet (20 mg total) by mouth daily. 05/19/17   Forrest Moron, MD  lisinopril (PRINIVIL,ZESTRIL) 20 MG tablet TAKE 1 TABLET DAILY 09/01/17   Delia Chimes A, MD  meloxicam (MOBIC) 7.5 MG tablet Take 1 tablet (7.5 mg total) by mouth daily. 04/13/18   Tasia Catchings,  V, PA-C  methocarbamol (ROBAXIN) 500 MG tablet Take 1 tablet (500 mg total) by mouth every 6 (six) hours as needed for muscle spasms. 08/13/17   Blue, Olivia C, PA-C  Multiple Vitamin (MULTIVITAMIN) LIQD Take 5 mLs by mouth daily. Iaso  Nutri burst Liquid vitamin    [provider]  naproxen (NAPROSYN) 500 MG tablet Take 1 tablet (500 mg total) by mouth 2 (two) times daily. 08/13/17   Blue, Olivia C, PA-C  traMADol (ULTRAM) 50 MG tablet Take 1 tablet (50 mg total) by mouth 2 (two) times daily. 08/21/17   Forrest Moron, MD    Family History Family History  Problem Relation Age of Onset  . Hypertension Mother   . Diabetes Mother   . Arthritis Mother   . Hyperlipidemia Mother   . Lung cancer Father   . Heart attack Father   . Diabetes Brother   . Hypertension Sister     Social History Social History   Tobacco Use  .  Smoking status: Never Smoker  . Smokeless tobacco: Former Systems developer    Types: Snuff  Substance Use Topics  . Alcohol use: Yes    Alcohol/week: 0.0 standard drinks    Comment: 2 drinks  . Drug use: No     Allergies   No known allergies   Review of Systems Review of Systems  Reason unable to perform ROS: See HPI as above.     Physical Exam Triage Vital Signs ED Triage Vitals  Enc Vitals Group     BP 04/13/18 1935 (!) 167/89     Pulse Rate 04/13/18 1935 65     Resp 04/13/18 1935 16     Temp 04/13/18 1935 98 F (36.7 C)     Temp Source 04/13/18 1935 Oral     SpO2 04/13/18 1935 99 %     Weight --      Height --      Head Circumference --      Peak Flow --      Pain Score 04/13/18 1937 8     Pain Loc --      Pain Edu? --       Excl. in [place]? --    No data found.  Updated Vital Signs BP (!) 167/89 (BP Location: Left Arm)   Pulse 65   Temp 98 F (36.7 C) (Oral)   Resp 16   SpO2 99%   Physical Exam  Constitutional: She is oriented to person, place, and time. She appears well-developed and well-nourished. No distress.  HENT:  Head: Normocephalic and atraumatic.  Eyes: Pupils are equal, round, and reactive to light. Conjunctivae are normal.  Musculoskeletal:  No swelling, erythema, contusion, increased warmth.  Tenderness to palpation along left first MCP.  Full range of motion, though with pain.  Strength normal and equal bilaterally.  Sensation intact and equal bilaterally.  Radial pulse 2+, cap refill less than 2 seconds.  Neurological: She is alert and oriented to person, place, and time.  Skin: She is not diaphoretic.     UC Treatments / Results  Labs (all labs ordered are listed, but only abnormal results are displayed) Labs Reviewed - No data to display  EKG None  Radiology No results found.  Procedures Procedures (including critical care time)  Medications Ordered in UC Medications - No data to display  Initial Impression / Assessment and Plan / UC Course  I have reviewed the triage vital signs and the nursing notes.  Pertinent labs & imaging results that were available during my care of the patient were reviewed by me and considered in my medical decision making (see chart for details).    Will treat for de Quervain's.  Mobic as directed.  Ice compress, thumb spica provided.  Return precautions given.  Final Clinical Impressions(s) / UC Diagnoses   Final diagnoses:  De Quervain's tenosynovitis, left    ED Prescriptions    Medication Sig Dispense Auth. Provider   meloxicam (MOBIC) 7.5 MG tablet Take 1 tablet (7.5 mg total) by mouth daily. 15 tablet Tobin Chad, [place] 04/13/18 2016

## 2018-04-13 NOTE — Discharge Instructions (Signed)
Start Mobic. Do not take ibuprofen (motrin/advil)/ naproxen (aleve) while on mobic. Ice compress, rest, wrist splint during activity. This may take a few weeks to completely resolve, but should be feeling better each week. Follow up with orthopedics for further evaluation if symptoms not improving.

## 2018-04-13 NOTE — ED Triage Notes (Signed)
C/o pain left thumb onset 1 week ago ,denies injury, states the pain is getting worse

## 2018-04-14 ENCOUNTER — Other Ambulatory Visit: Payer: Self-pay | Admitting: Family Medicine

## 2018-04-14 NOTE — Telephone Encounter (Signed)
Call to patient- left message to call for follow up appointment for medications. 30 day courtesy refill given. Requested Prescriptions  Pending Prescriptions Disp Refills  . amLODipine (NORVASC) 10 MG tablet [Pharmacy Med Name: AMLODIPINE BESYLATE TABS 10MG ] 30 tablet 0    Sig: TAKE 1 TABLET DAILY     Cardiovascular:  Calcium Channel Blockers Failed - 04/14/2018 10:36 AM      Failed - Last BP in normal range    BP Readings from Last 1 Encounters:  04/13/18 (!) 167/89         Failed - Valid encounter within last 6 months    Recent Outpatient Visits          7 months ago Closed compression fracture of thoracic vertebra, sequela   [facility] at [place], MD   8 months ago Encounter for health maintenance examination in adult   [facility] at [facility] [place] A, MD   10 months ago Essential hypertension   [facility] at [facility] [place], MD   1 year ago Encounter for health maintenance examination in adult   Primary Care at [facility], [place], MD   1 year ago Bilateral lower extremity edema   [facility] at [place], PA-C           . lisinopril (PRINIVIL,ZESTRIL) 20 MG tablet [Pharmacy Med Name: LISINOPRIL TABS 20MG ] 30 tablet 0    Sig: TAKE 1 TABLET DAILY     Cardiovascular:  ACE Inhibitors Failed - 04/14/2018 10:36 AM      Failed - Cr in normal range and within 180 days    Creat  Date Value Ref Range Status  10/16/2015 0.82 0.50 - 1.05 mg/dL Final   Creatinine, Ser  Date Value Ref Range Status  08/14/2017 1.13 (H) 0.57 - 1.00 mg/dL Final         Failed - K in normal range and within 180 days    Potassium  Date Value Ref Range Status  08/14/2017 4.2 3.5 - 5.2 mmol/L Final         Failed - Last BP in normal range    BP Readings from Last 1 Encounters:  04/13/18 (!) 167/89         Failed - Valid encounter within last 6 months    Recent Outpatient Visits          7 months ago Closed compression fracture of thoracic  vertebra, sequela   [facility] at [place], MD   8 months ago Encounter for health maintenance examination in adult   [facility] at [place], MD   10 months ago Essential hypertension   Primary Care at [facility], [place], MD   1 year ago Encounter for health maintenance examination in adult   Primary Care at [facility], [place]lie [place]ons, MD   1 year ago Bilateral lower extremity edema   [facility] at [place], PA-C             Passed - Patient is not pregnant

## 2018-04-27 ENCOUNTER — Other Ambulatory Visit: Payer: Self-pay

## 2018-04-27 ENCOUNTER — Ambulatory Visit (INDEPENDENT_AMBULATORY_CARE_PROVIDER_SITE_OTHER): Admitting: Family Medicine

## 2018-04-27 ENCOUNTER — Encounter: Payer: Self-pay | Admitting: Family Medicine

## 2018-04-27 VITALS — BP 148/72 | HR 73 | Temp 98.6°F | Resp 17 | Ht 66.54 in | Wt 194.4 lb

## 2018-04-27 DIAGNOSIS — N950 Postmenopausal bleeding: Secondary | ICD-10-CM

## 2018-04-27 DIAGNOSIS — Z23 Encounter for immunization: Secondary | ICD-10-CM | POA: Diagnosis not present

## 2018-04-27 DIAGNOSIS — E1169 Type 2 diabetes mellitus with other specified complication: Secondary | ICD-10-CM

## 2018-04-27 DIAGNOSIS — N760 Acute vaginitis: Secondary | ICD-10-CM

## 2018-04-27 DIAGNOSIS — B9689 Other specified bacterial agents as the cause of diseases classified elsewhere: Secondary | ICD-10-CM

## 2018-04-27 DIAGNOSIS — I10 Essential (primary) hypertension: Secondary | ICD-10-CM

## 2018-04-27 DIAGNOSIS — E119 Type 2 diabetes mellitus without complications: Secondary | ICD-10-CM

## 2018-04-27 LAB — POCT GLYCOSYLATED HEMOGLOBIN (HGB A1C): Hemoglobin A1C: 6.8 % — AB (ref 4.0–5.6)

## 2018-04-27 LAB — POCT URINALYSIS DIP (MANUAL ENTRY)
Bilirubin, UA: NEGATIVE
Blood, UA: NEGATIVE
Glucose, UA: NEGATIVE mg/dL
Leukocytes, UA: NEGATIVE
Nitrite, UA: NEGATIVE
Protein Ur, POC: NEGATIVE mg/dL
Spec Grav, UA: 1.01
Urobilinogen, UA: 0.2 U/dL
pH, UA: 6

## 2018-04-27 LAB — POCT WET + KOH PREP
Trich by wet prep: ABSENT
YEAST BY KOH: ABSENT
YEAST BY WET PREP: ABSENT

## 2018-04-27 MED ORDER — LISINOPRIL 20 MG PO TABS: 20.0000 mg | ORAL_TABLET | Freq: Every day | ORAL | 1 refills | Status: DC

## 2018-04-27 MED ORDER — AMLODIPINE BESYLATE 10 MG PO TABS: 10.0000 mg | ORAL_TABLET | Freq: Every day | ORAL | 1 refills | Status: DC

## 2018-04-27 MED ORDER — METRONIDAZOLE 500 MG PO TABS: 500.0000 mg | ORAL_TABLET | Freq: Two times a day (BID) | ORAL | 0 refills | Status: DC

## 2018-04-27 NOTE — Patient Instructions (Addendum)
     If you have lab work done today you will be contacted with your lab results within the next 2 weeks.  If you have not heard from [place] then please contact us. The fastest way to get your results is to register for My Chart.   IF you received an x-ray today, you will receive an invoice from [facility] Rad[facility]. Please contact [facility] at [phone] with questions or concerns regarding your invoice.   IF you received labwork today, you will receive an invoice from [facility]. Please contact [facility] at [phone] with questions or concerns regarding your invoice.   Our billing staff will not be able to assist you with questions regarding bills from these companies.  You will be contacted with the lab results as soon as they are available. The fastest way to get your results is to activate your My Chart account. Instructions are located on the last page of this paperwork. If you have not heard from [place] regarding the results in 2 weeks, please contact this office.    Postmenopausal Bleeding Postmenopausal bleeding is any bleeding a woman has after she has entered into menopause. Menopause is the end of a woman's fertile years. After menopause, a woman no longer ovulates or has menstrual periods. Postmenopausal bleeding can be caused by various things. Any type of postmenopausal bleeding, even if it appears to be a typical menstrual period, is concerning. This should be evaluated by your health care provider. Any treatment will depend on the cause of the bleeding. Follow these instructions at home: Monitor your condition for any changes. The following actions may help to alleviate any discomfort you are experiencing:  Avoid the use of tampons and douches as directed by your health care provider.  Change your pads frequently.  Get regular pelvic exams and Pap tests.  Keep all follow-up appointments for diagnostic tests as directed by your health care provider.  Contact a health  care provider if:  Your bleeding lasts more than 1 week.  You have abdominal pain.  You have bleeding with sexual intercourse. Get help right away if:  You have a fever, chills, headache, dizziness, muscle aches, and bleeding.  You have severe pain with bleeding.  You are passing blood clots.  You have bleeding and need more than 1 pad an hour.  You feel faint. This information is not intended to replace advice given to you by your health care provider. Make sure you discuss any questions you have with your health care provider. Document Released: 09/04/2005 Document Revised: 11/02/2015 Document Reviewed: 12/24/2012 Elsevier Interactive Patient Education  [employer].

## 2018-04-27 NOTE — Progress Notes (Signed)
Chief Complaint  Patient presents with  . blood in bed one morning last week upon waking up, per pt  h    Per pt she may have scratched herself down there but was very alarmed when seeing the blood.  Bright red blood on mattress pad  . Medication Refill    amlodipine and lisinopril.      HPI   Hypertension: Patient here for follow-up of elevated blood pressure. She is not exercising and is adherent to low salt diet.  Blood pressure is well controlled at home. Cardiac symptoms none. Patient denies chest pain, chest pressure/discomfort, dyspnea, exertional chest pressure/discomfort, fatigue, irregular heart beat and lower extremity edema.  Cardiovascular risk factors: hypertension and obesity (BMI >= 30 kg/m2). Use of agents associated with hypertension: none. History of target organ damage: none.  BP Readings from Last 3 Encounters:  04/27/18 (!) 148/72  04/13/18 (!) 167/89  08/21/17 (!) 170/98   Postmenopausal vaginal bleeding Patient reports that she work up one morning and had a dollar bill sized amount of blood on her mattress She is menopausal and went through changes at age 62 She reports that she was concerned if her periods were coming back but there was no other bleeding She was not sexual active prior to the bleeding She denies bleeding with sexual intercourse She is not regularly sexually active She denies pelvic/groin pain No breast tendernes Her mammogram was done 3 weeks ago Her pap smear was done in March 2019 and was normal.    Diabetes Mellitus: Patient presents for follow up of diabetes. Symptoms: none. Symptoms have stabilized. Patient denies foot ulcerations, hyperglycemia, hypoglycemia , increase appetite, nausea, paresthesia of the feet, polydipsia, polyuria and visual disturbances.  Evaluation to date has been included: hemoglobin A1C.  Home sugars: patient does not check sugars. Treatment to date: more intensive attention to diet which has been effective.   Lab  Results  Component Value Date   HGBA1C 6.7 (H) 05/22/2017    Past Medical History:  Diagnosis Date  . Anxiety   . Arthritis   . Attention deficit disorder without mention of hyperactivity   . Depression   . Esophageal reflux   . Hypertension   . Iron deficiency anemia, unspecified   . Myalgia and myositis, unspecified   . Pneumonia    2008 or 2009  . Undiagnosed cardiac murmurs   . Unspecified vitamin D deficiency     Current Outpatient Medications  Medication Sig Dispense Refill  . amLODipine (NORVASC) 10 MG tablet Take 1 tablet (10 mg total) by mouth daily. 90 tablet 1  . aspirin 81 MG EC tablet Take 1 tablet (81 mg total) by mouth daily. Swallow whole. 90 tablet 3  . GARLIC PO Take 1 tablet by mouth.    Marland Kitchen lisinopril (PRINIVIL,ZESTRIL) 20 MG tablet Take 1 tablet (20 mg total) by mouth daily. 90 tablet 1  . meloxicam (MOBIC) 7.5 MG tablet Take 1 tablet (7.5 mg total) by mouth daily. 15 tablet 0  . methocarbamol (ROBAXIN) 500 MG tablet Take 1 tablet (500 mg total) by mouth every 6 (six) hours as needed for muscle spasms. 20 tablet 0  . Multiple Vitamin (MULTIVITAMIN) LIQD Take 5 mLs by mouth daily. Iaso  Nutri burst Liquid vitamin    . naproxen (NAPROSYN) 500 MG tablet Take 1 tablet (500 mg total) by mouth 2 (two) times daily. 30 tablet 0  . traMADol (ULTRAM) 50 MG tablet Take 1 tablet (50 mg total) by mouth 2 (two)  times daily. 30 tablet 0   No current facility-administered medications for this visit.     Allergies:  Allergies  Allergen Reactions  . No Known Allergies     History reviewed. No pertinent surgical history.  Social History   Socioeconomic History  . Marital status: Married    Spouse name: Not on file  . Number of children: Not on file  . Years of education: Not on file  . Highest education level: Not on file  Occupational History  . Not on file  Social Needs  . Financial resource strain: Not on file  . Food insecurity:    Worry: Not on file     Inability: Not on file  . Transportation needs:    Medical: Not on file    Non-medical: Not on file  Tobacco Use  . Smoking status: Never Smoker  . Smokeless tobacco: Former Systems developer    Types: Snuff  Substance and Sexual Activity  . Alcohol use: Yes    Alcohol/week: 0.0 standard drinks    Comment: 2 drinks  . Drug use: No  . Sexual activity: Never    Birth control/protection: None  Lifestyle  . Physical activity:    Days per week: Not on file    Minutes per session: Not on file  . Stress: Not on file  Relationships  . Social connections:    Talks on phone: Not on file    Gets together: Not on file    Attends religious service: Not on file    Active member of club or organization: Not on file    Attends meetings of clubs or organizations: Not on file    Relationship status: Not on file  Other Topics Concern  . Not on file  Social History Narrative  . Not on file    Family History  Problem Relation Age of Onset  . Hypertension Mother   . Diabetes Mother   . Arthritis Mother   . Hyperlipidemia Mother   . Lung cancer Father   . Heart attack Father   . Diabetes Brother   . Hypertension Sister      ROS Review of Systems See HPI Constitution: No fevers or chills No malaise No diaphoresis Skin: No rash or itching Eyes: no blurry vision, no double vision GU: no dysuria or hematuria Neuro: no dizziness or headaches all others reviewed and negative   Objective: Vitals:   04/27/18 0845 04/27/18 0916  BP: (!) 163/89 (!) 148/72  Pulse: 73   Resp: 17   Temp: 98.6 F (37 C)   TempSrc: Oral   SpO2: 97%   Weight: 194 lb 6.4 oz (88.2 kg)   Height: 5' 6.54" (1.69 m)     Physical Exam  Physical Exam  Constitutional: He is oriented to person, place, and time. He appears well-developed and well-nourished.  HENT:  Head: Normocephalic and atraumatic.  Eyes: Conjunctivae and EOM are normal.  Cardiovascular: Normal rate, regular rhythm, normal heart sounds and intact  distal pulses.  No murmur heard. Pulmonary/Chest: Effort normal and breath sounds normal. No stridor. No respiratory distress. He has no wheezes.  Neurological: He is alert and oriented to person, place, and time.  Skin: Skin is warm. Capillary refill takes less than 2 seconds.  Psychiatric: He has a normal mood and affect. His behavior is normal. Judgment and thought content normal.   Vaginal exam- Chaperone Present Labia normal bilaterally without skin lesions Urethral meatus normal appearing without erythema Vagina with white, thin discharge  No CMT, ovaries small and not palpable Uterus midline, nontender  Rectal exam - normal tone, no palpable masses   Assessment and Plan Roselind was seen today for blood in bed one morning last week upon waking up, per pt  h and medication refill.  Diagnoses and all orders for this visit:  Postmenopausal vaginal bleeding- will refer for endometrial biopsy in house with Dr. Pamella Pert Will screen for other factors -     GC/Chlamydia Probe Amp -     POCT Wet + KOH Prep -     POCT urinalysis dipstick  Type 2 diabetes mellitus with other specified complication, unspecified whether long term insulin use ([place]) -     HM Diabetes Foot Exam  Diet-controlled diabetes mellitus ([place])- continue diet Pt plans to resume exercise On ace inhibitor - lisinopril 20mg  -     POCT glycosylated hemoglobin (Hb A1C)  Essential hypertension-  Goal 130/80 or less, continue ace inhibitor and amlodipine bp not at goal for DM Patient anxious about additional testing which could play a role Continue DASH diet -     Comprehensive metabolic panel  Need for vaccinations - flu and pneumonia given  Other orders -     Pneumococcal polysaccharide vaccine 23-valent greater than or equal to 2yo subcutaneous/IM -     Flu Vaccine QUAD 36+ mos IM -     lisinopril (PRINIVIL,ZESTRIL) 20 MG tablet; Take 1 tablet (20 mg total) by mouth daily. -     amLODipine (NORVASC) 10 MG  tablet; Take 1 tablet (10 mg total) by mouth daily.     [place]

## 2018-04-28 LAB — COMPREHENSIVE METABOLIC PANEL
ALT: 20 IU/L (ref 0–32)
AST: 27 IU/L (ref 0–40)
Albumin/Globulin Ratio: 1.4 (ref 1.2–2.2)
Albumin: 4.7 g/dL (ref 3.6–4.8)
Alkaline Phosphatase: 74 IU/L (ref 39–117)
BILIRUBIN TOTAL: 0.4 mg/dL (ref 0.0–1.2)
BUN/Creatinine Ratio: 14 (ref 12–28)
BUN: 14 mg/dL (ref 8–27)
CALCIUM: 10.1 mg/dL (ref 8.7–10.3)
CO2: 23 mmol/L (ref 20–29)
Chloride: 102 mmol/L (ref 96–106)
Creatinine, Ser: 1.02 mg/dL — ABNORMAL HIGH (ref 0.57–1.00)
GFR, EST AFRICAN AMERICAN: 68 mL/min/{1.73_m2} (ref >59)
GFR, EST NON AFRICAN AMERICAN: 59 mL/min/{1.73_m2} — AB (ref >59)
GLOBULIN, TOTAL: 3.3 g/dL (ref 1.5–4.5)
Glucose: 96 mg/dL (ref 65–99)
POTASSIUM: 4.2 mmol/L (ref 3.5–5.2)
SODIUM: 142 mmol/L (ref 134–144)
TOTAL PROTEIN: 8 g/dL (ref 6.0–8.5)

## 2018-04-28 LAB — GC/CHLAMYDIA PROBE AMP
Chlamydia trachomatis, NAA: NEGATIVE
Neisseria gonorrhoeae by PCR: NEGATIVE

## 2018-04-29 ENCOUNTER — Ambulatory Visit
Admission: RE | Admit: 2018-04-29 | Discharge: 2018-04-29 | Disposition: A | Discharge status: Home / Self Care | Source: Ambulatory Visit | Attending: Family Medicine | Admitting: Family Medicine

## 2018-04-29 DIAGNOSIS — N950 Postmenopausal bleeding: Secondary | ICD-10-CM

## 2018-04-30 ENCOUNTER — Encounter: Payer: Self-pay | Admitting: Family Medicine

## 2018-05-22 ENCOUNTER — Encounter: Payer: Self-pay | Admitting: Family Medicine

## 2018-05-22 NOTE — Progress Notes (Signed)
IMPRESSION: There is no mammographic evidence of malignancy. Routine mammographic evaluation in 1 yr is recommended (03/28/2019)

## 2018-05-25 ENCOUNTER — Ambulatory Visit (INDEPENDENT_AMBULATORY_CARE_PROVIDER_SITE_OTHER): Admitting: Family Medicine

## 2018-05-25 ENCOUNTER — Encounter: Payer: Self-pay | Admitting: Obstetrics and Gynecology

## 2018-05-25 ENCOUNTER — Encounter: Payer: Self-pay | Admitting: Family Medicine

## 2018-05-25 ENCOUNTER — Other Ambulatory Visit: Payer: Self-pay

## 2018-05-25 VITALS — BP 165/86 | HR 66 | Temp 97.7°F | Resp 18 | Ht 66.54 in | Wt 188.2 lb

## 2018-05-25 DIAGNOSIS — N950 Postmenopausal bleeding: Secondary | ICD-10-CM

## 2018-05-25 NOTE — Progress Notes (Signed)
12/16/201911:12 AM  Emily Lopez 03-22-1956, 62 y.o. female [id number]  Chief Complaint  Patient presents with  . Biopsy    endometrial     HPI:   Patient is a 62 y.o. female with past medical history significant for post menopausal bleeding who presents today for endometrial bx  Seen by Dr Bridget Hartshorn Apr 27 2018 Had a one time episode of waking with blood in her bed, single event G4P3, SVD Menopause at age 74 Normal pap marc 2019 Pelvic US - stable fibroids with submucosal involvement, endometrium 45mm, benign stable L ovarian cyst. Normal R ovary   Fall Risk  05/25/2018 04/27/2018 08/21/2017 08/14/2017 05/22/2017  Falls in the past year? 0 0 No No No     Depression screen [facility] 2/9 05/25/2018 04/27/2018 08/21/2017  Decreased Interest 0 0 3  Down, Depressed, Hopeless 0 0 1  PHQ - 2 Score 0 0 4    Allergies  Allergen Reactions  . No Known Allergies     Prior to Admission medications   Medication Sig Start Date End Date Taking? Authorizing Provider  amLODipine (NORVASC) 10 MG tablet Take 1 tablet (10 mg total) by mouth daily. 04/27/18  Yes Forrest Moron, MD  aspirin 81 MG EC tablet Take 1 tablet (81 mg total) by mouth daily. Swallow whole. 09/27/16  Yes Forrest Moron, MD  GARLIC PO Take 1 tablet by mouth.   Yes [provider]  lisinopril (PRINIVIL,ZESTRIL) 20 MG tablet Take 1 tablet (20 mg total) by mouth daily. 04/27/18  Yes Stallings, Zoe A, MD  meloxicam (MOBIC) 7.5 MG tablet Take 1 tablet (7.5 mg total) by mouth daily. 04/13/18  Yes Yu, Amy V, PA-C  methocarbamol (ROBAXIN) 500 MG tablet Take 1 tablet (500 mg total) by mouth every 6 (six) hours as needed for muscle spasms. 08/13/17  Yes Blue, Olivia C, PA-C  metroNIDAZOLE (FLAGYL) 500 MG tablet Take 1 tablet (500 mg total) by mouth 2 (two) times daily. 04/27/18  Yes Forrest Moron, MD  Multiple Vitamin (MULTIVITAMIN) LIQD Take 5 mLs by mouth daily. [place]  Nutri burst Liquid vitamin   Yes [provider]  naproxen (NAPROSYN) 500 MG tablet Take 1 tablet (500 mg total) by mouth 2 (two) times daily. 08/13/17  Yes Blue, Olivia C, PA-C  traMADol (ULTRAM) 50 MG tablet Take 1 tablet (50 mg total) by mouth 2 (two) times daily. 08/21/17  Yes Forrest Moron, MD    Past Medical History:  Diagnosis Date  . Anxiety   . Arthritis   . Attention deficit disorder without mention of hyperactivity   . Depression   . Esophageal reflux   . Hypertension   . Iron deficiency anemia, unspecified   . Myalgia and myositis, unspecified   . Pneumonia    2008 or 2009  . Undiagnosed cardiac murmurs   . Unspecified vitamin D deficiency     No past surgical history on file.  Social History   Tobacco Use  . Smoking status: Never Smoker  . Smokeless tobacco: Former Systems developer    Types: Snuff  Substance Use Topics  . Alcohol use: Yes    Alcohol/week: 0.0 standard drinks    Comment: 2 drinks    Family History  Problem Relation Age of Onset  . Hypertension Mother   . Diabetes Mother   . Arthritis Mother   . Hyperlipidemia Mother   . Lung cancer Father   . Heart attack Father   . Diabetes Brother   .  Hypertension Sister     ROS Per hpi  OBJECTIVE:  Blood pressure (!) 165/86, pulse 66, temperature 97.7 F (36.5 C), temperature source Oral, resp. rate 18, height 5' 6.54" (1.69 m), weight 188 lb 3.2 oz (85.4 kg), SpO2 95 %. Body mass index is 29.89 kg/m.    Physical Exam  Gen: AAOx3, NAD  Procedure note Verbal informed consent obtained after explaining procedure for endometrial biopsy, r/se/b. Placed in proper position. Speculum inserted. Cervix cleansed with betadine. No tenaculum available. Attempted to insert endometrial biopsy pipette without counter traction but os too stenotic. Procedure not completed. Patient referred to gyn.   ASSESSMENT and PLAN  1. Postmenopausal vaginal bleeding - Ambulatory referral to Gynecology  Return if symptoms worsen or fail to improve.    Rutherford Guys, MD Primary Care at [place], [place] [address] Ph.  [phone] Fax [phone]

## 2018-05-25 NOTE — Patient Instructions (Signed)
° ° ° °  If you have lab work done today you will be contacted with your lab results within the next 2 weeks.  If you have not heard from us then please contact us. The fastest way to get your results is to register for My Chart. ° ° °IF you received an x-ray today, you will receive an invoice from [facility]. Please contact [facility] at [phone] with questions or concerns regarding your invoice.  ° °IF you received labwork today, you will receive an invoice from [facility]. Please contact [facility] at [phone] with questions or concerns regarding your invoice.  ° °Our billing staff will not be able to assist you with questions regarding bills from these companies. ° °You will be contacted with the lab results as soon as they are available. The fastest way to get your results is to activate your My Chart account. Instructions are located on the last page of this paperwork. If you have not heard from us regarding the results in 2 weeks, please contact this office. °  ° ° ° °

## 2018-06-15 ENCOUNTER — Encounter

## 2018-06-15 ENCOUNTER — Ambulatory Visit: Admitting: Obstetrics and Gynecology

## 2018-06-16 ENCOUNTER — Other Ambulatory Visit: Payer: Self-pay

## 2018-06-16 ENCOUNTER — Encounter: Payer: Self-pay | Admitting: Obstetrics and Gynecology

## 2018-06-16 ENCOUNTER — Ambulatory Visit (INDEPENDENT_AMBULATORY_CARE_PROVIDER_SITE_OTHER): Admitting: Obstetrics and Gynecology

## 2018-06-16 VITALS — BP 132/70 | HR 76 | Resp 16 | Ht 66.0 in | Wt 190.0 lb

## 2018-06-16 DIAGNOSIS — D219 Benign neoplasm of connective and other soft tissue, unspecified: Secondary | ICD-10-CM

## 2018-06-16 DIAGNOSIS — N950 Postmenopausal bleeding: Secondary | ICD-10-CM | POA: Diagnosis not present

## 2018-06-16 NOTE — Progress Notes (Signed)
GYNECOLOGY  VISIT   HPI: 63 y.o.   Married  [place] American  female   [phone] with Patient's last menstrual period was 06/11/2011 (within years).   here as a new patient referred from Dr. Nolon Rod for PMB once about a month ago; per patient feels like she will start her period at times     Woke up one morning a month ago and noted blood in her bed.   She states she has fibroids.   Pelvic US report from 04/29/18 in Epic CLINICAL DATA:  Postmenopausal bleeding.  EXAM: TRANSABDOMINAL AND TRANSVAGINAL ULTRASOUND OF PELVIS  TECHNIQUE: Both transabdominal and transvaginal ultrasound examinations of the pelvis were performed. Transabdominal technique was performed for global imaging of the pelvis including uterus, ovaries, adnexal regions, and pelvic cul-de-sac. It was necessary to proceed with endovaginal exam following the transabdominal exam to visualize the endometrium and ovaries.  COMPARISON:  12/17/2013  FINDINGS: Uterus  Measurements: 9.4 x 4.2 x 5.7 cm = volume: 119 mL. Multiple small uterine fibroids are again seen involving the uterus diffusely, largest measuring 3.5 cm. These show no significant change compared to previous study.  Endometrium  Thickness: 8 mm. Several fibroids have submucosal components indenting the endometrium.  Right ovary  Measurements: 3.4 x 2.7 x 2.5 cm = volume: 11.9 mL. Normal appearance/no adnexal mass.  Left ovary  Measurements: 3.1 x 1.6 x 2.5 cm = volume: 6.5 mL. A 1.4 cm benign-appearing left ovarian cyst is seen, also without change since previous study.  Other findings  No abnormal free fluid.  IMPRESSION: Endometrial thickness measures 8 mm. In the setting of post-menopausal bleeding, endometrial sampling is indicated to exclude carcinoma. If results are benign, sonohysterogram should be considered for focal lesion work-up. (Ref: Radiological Reasoning: Algorithmic Workup of Abnormal Vaginal Bleeding  with Endovaginal Sonography and Sonohysterography. AJR 2008; 323:F57-32).  Multiple small uterine fibroids measuring up to 3.5 cm, without significant change since previous study.  1.4 cm left ovarian cyst, also stable since prior exam. This has benign characteristics and is a common finding in postmenopausal females. No imaging follow up is required. This follows consensus guidelines: Simple Adnexal Cysts: SRU Consensus Conference Update on Follow-up and Reporting. Radiology 2019; [id number].   Electronically Signed   By: Earle Gell M.D.   On: 04/29/2018 16:40   GYNECOLOGIC HISTORY: Patient's last menstrual period was 06/11/2011 (within years). Contraception:  postmenopausal Menopausal hormone therapy:  none Last mammogram:  03/27/18 per patient at [facility] -- will call for report Last pap smear:   08/14/17 Pap and HR HPV negative        OB History    Gravida  4   Para  3   Term  0   Preterm  0   AB  1   Living  3     SAB  0   TAB  0   Ectopic  0   Multiple  0   Live Births  0              Patient Active Problem List   Diagnosis Date Noted  . Diet-controlled diabetes mellitus ([place]) 05/22/2017  . Class 1 obesity due to excess calories with serious comorbidity and body mass index (BMI) of 30.0 to 30.9 in adult 05/22/2017  . Right leg swelling 10/21/2016  . VITAMIN D DEFICIENCY 03/13/2009  . DEPRESSIVE DISORDER 08/24/2008  . OVERWEIGHT 07/25/2008  . ATTENTION DEFICIT DISORDER, ADULT 07/25/2008  . SYSTOLIC MURMUR 23/76/2831  . GERD 07/20/2007  . ANEMIA,  IRON DEFICIENCY, UNSPEC. 08/07/2006  . Essential hypertension 08/07/2006    Past Medical History:  Diagnosis Date  . Anxiety   . Arthritis   . Attention deficit disorder without mention of hyperactivity   . Depression   . Esophageal reflux   . Hypertension   . Iron deficiency anemia, unspecified   . Myalgia and myositis, unspecified   . Pneumonia    2008 or 2009  . Undiagnosed cardiac  murmurs   . Unspecified vitamin D deficiency     History reviewed. No pertinent surgical history.  Current Outpatient Medications  Medication Sig Dispense Refill  . amLODipine (NORVASC) 10 MG tablet Take 1 tablet (10 mg total) by mouth daily. 90 tablet 1  . aspirin 81 MG EC tablet Take 1 tablet (81 mg total) by mouth daily. Swallow whole. 90 tablet 3  . lisinopril (PRINIVIL,ZESTRIL) 20 MG tablet Take 1 tablet (20 mg total) by mouth daily. 90 tablet 1  . meloxicam (MOBIC) 7.5 MG tablet Take 1 tablet (7.5 mg total) by mouth daily. 15 tablet 0  . Multiple Vitamin (MULTIVITAMIN) LIQD Take 5 mLs by mouth daily. [place]  Nutri burst Liquid vitamin    . naproxen (NAPROSYN) 500 MG tablet Take 1 tablet (500 mg total) by mouth 2 (two) times daily. 30 tablet 0  . Prenatal Vit-Fe Fumarate-FA (PRENATAL MULTIVITAMIN) TABS tablet Take 1 tablet by mouth daily at 12 noon.    . traMADol (ULTRAM) 50 MG tablet Take 1 tablet (50 mg total) by mouth 2 (two) times daily. 30 tablet 0  . GARLIC PO Take 1 tablet by mouth.    . methocarbamol (ROBAXIN) 500 MG tablet Take 1 tablet (500 mg total) by mouth every 6 (six) hours as needed for muscle spasms. (Patient not taking: Reported on 06/16/2018) 20 tablet 0   No current facility-administered medications for this visit.      ALLERGIES: No known allergies  Family History  Problem Relation Age of Onset  . Hypertension Mother   . Diabetes Mother   . Arthritis Mother   . Hyperlipidemia Mother   . Lung cancer Father   . Heart attack Father   . Diabetes Brother   . Hypertension Sister   . Breast cancer Sister   . Hernia Sister     Social History   Socioeconomic History  . Marital status: Married    Spouse name: Not on file  . Number of children: Not on file  . Years of education: Not on file  . Highest education level: Not on file  Occupational History  . Not on file  Social Needs  . Financial resource strain: Not on file  . Food insecurity:    Worry: Not  on file    Inability: Not on file  . Transportation needs:    Medical: Not on file    Non-medical: Not on file  Tobacco Use  . Smoking status: Never Smoker  . Smokeless tobacco: Former Systems developer    Types: Snuff  Substance and Sexual Activity  . Alcohol use: Yes    Alcohol/week: 0.0 standard drinks    Comment: 2 drinks  . Drug use: Never  . Sexual activity: Not Currently    Birth control/protection: None, Post-menopausal  Lifestyle  . Physical activity:    Days per week: Not on file    Minutes per session: Not on file  . Stress: Not on file  Relationships  . Social connections:    Talks on phone: Not on file  Gets together: Not on file    Attends religious service: Not on file    Active member of club or organization: Not on file    Attends meetings of clubs or organizations: Not on file    Relationship status: Not on file  . Intimate partner violence:    Fear of current or ex partner: Not on file    Emotionally abused: Not on file    Physically abused: Not on file    Forced sexual activity: Not on file  Other Topics Concern  . Not on file  Social History Narrative  . Not on file    Review of Systems  Constitutional: Negative.   HENT: Negative.   Eyes: Negative.   Respiratory: Negative.   Cardiovascular: Negative.   Gastrointestinal: Negative.   Endocrine: Negative.   Genitourinary: Negative.   Musculoskeletal: Negative.   Skin: Negative.   Allergic/Immunologic: Negative.   Neurological: Negative.   Hematological: Negative.   Psychiatric/Behavioral: Negative.     PHYSICAL EXAMINATION:    BP 132/70 (BP Location: Right Arm, Patient Position: Sitting, Cuff Size: Large)   Pulse 76   Resp 16   Ht 5\' 6"  (1.676 m)   Wt 190 lb (86.2 kg)   LMP 06/11/2011 (Within Years)   BMI 30.67 kg/m     General appearance: alert, cooperative and appears stated age Head: Normocephalic, without obvious abnormality, atraumatic Neck: no adenopathy, supple, symmetrical, trachea  midline and thyroid normal to inspection and palpation Lungs: clear to auscultation bilaterally Heart: regular rate and rhythm Abdomen: soft, non-tender, no masses,  no organomegaly Extremities: extremities normal, atraumatic, no cyanosis or edema No abnormal inguinal nodes palpated Neurologic: Grossly normal  Pelvic: External genitalia:  no lesions              Urethra:  normal appearing urethra with no masses, tenderness or lesions              Bartholins and Skenes: normal                 Vagina: normal appearing vagina with normal color and discharge, no lesions              Cervix: no lesions.  Stenotic cervix.                Bimanual Exam:  Uterus:  normal size, contour, position, consistency, mobility, non-tender.  Bm exam limited by inability to relax.               Adnexa: no mass, fullness, tenderness          Attempted EMB.  Aborted procedure.  Consent for procedure.  Sterile prep of cervix with Hibiclens.  Paracervical block with 10 cc 1% lidocaine, lot number [id number], expiration 4/23. Tenaculum to anterior cervical lip.  Unable to pass os finder or hegar dilator through external os.  Patient also uncomfortable with palpation of this area.  Procedure abandoned.  Chaperone was present for exam.  ASSESSMENT  Postmenopausal bleeding.  Thickened endometrium.  Fibroids, some submucosal.  Cervical stenosis.  Left ovary with 1.4 cm benign cyst, stable.   PLAN  Discussion of postmenopausal bleeding and benign and malignant causes. Discussion of hysteroscopy with Myosure polypectomy, dilation and curettage under US guidance. Risks, benefits, and alternatives reviewed. Risks include but are not limited to bleeding, infection, damage to surrounding organs including uterine perforation requiring hospitalization and laparoscopy, pulmonary edema, reaction to anesthesia, DVT, PE, need for further treatment and surgery including repeat hysteroscopy, hysterectomy  or medical  therapy.   The possibility of inability to complete the surgery due to cervical stenosis also discussed. Surgical expectations and recovery discussed.  Patient wishes to proceed.   An After Visit Summary was printed and given to the patient.  __60____ minutes face to face time of which over 50% was spent in counseling.

## 2018-06-16 NOTE — Patient Instructions (Signed)
Postmenopausal Bleeding    Postmenopausal bleeding is any bleeding that happens after menopause. Menopause is when a woman's period stops. Any type of bleeding after menopause should be checked by your doctor. Treatment will depend on the cause.  Follow these instructions at home:   Pay attention to any changes in your symptoms.   Avoid using tampons and douches as told by your doctor.   Change your pads regularly.   Get regular pelvic exams and Pap tests.   Take iron pills as told by your doctor.   Take over-the-counter and prescription medicines only as told by your doctor.   Keep all follow-up visits as told by your doctor. This is important.  Contact a doctor if:   Your bleeding lasts for more than 1 week.   You have belly (abdominal) pain.   You have bleeding during or after sex (intercourse).   You have bleeding that happens more often than every 3 weeks.  Get help right away if:   You have a fever, chills, a headache, dizziness, muscle aches, and bleeding.   You have strong pain with bleeding.   You have clumps of blood (blood clots) coming from your vagina.   You have a lot of bleeding and:  ? You need more than 1 pad an hour.  ? This has never happened before.   You feel like you are going to pass out (faint).  Summary   Any type of bleeding after menopause should be checked by your doctor.   Pay attention to any changes in your symptoms.   Keep all follow-up visits as told by your doctor.  This information is not intended to replace advice given to you by your health care provider. Make sure you discuss any questions you have with your health care provider.  Document Released: 03/05/2008 Document Revised: 07/02/2016 Document Reviewed: 07/02/2016  Elsevier Interactive Patient Education  2019 [employer]

## 2018-06-23 ENCOUNTER — Telehealth: Payer: Self-pay | Admitting: Obstetrics and Gynecology

## 2018-06-23 NOTE — Telephone Encounter (Signed)
Spoke with patient regarding benefit for surgery. Patient understood and agreeable. Patient has confirmed and is ready to schedule. Patient aware this is professional benefit only. Patient aware will be contacted by hospital for separate benefits. Forwarding to Conservation officer, historic buildings for scheduling.    Routing to Lamont Snowball, RN

## 2018-06-24 NOTE — Telephone Encounter (Signed)
Call from patient. Reviewed surgery date options. Patient agreeable to proceed on 07-14-2018. Surgery instructions reviewed and printed copy will be mailed with brochure from hospital.   Routing to Dr Quincy Simmonds. Encounter closed.

## 2018-07-03 NOTE — H&P (Signed)
Visit   06/16/2018 Emily Lopez, Emily All, MD  Obstetrics and Gynecology   Postmenopausal bleeding +1 more  Dx   New Patient (Initial Visit)   ; Referred by Emily Guys, MD  Reason for Visit   Additional Documentation   Vitals:   BP 132/70 (BP Location: Right Arm, Patient Position: Sitting, Cuff Size: Large)   Pulse 76   Resp 16   Ht 5\' 6"  (1.676 m)   Wt 86.2 kg   LMP 06/11/2011 (Within Years)   BMI 30.67 kg/m   BSA 2 m   Flowsheets:   MEWS Score,   Anthropometrics     Encounter Info:   Billing Info,   History,   Allergies,   Detailed Report     Lopez Notes   Progress Notes by Emily Cobbs, MD at 06/16/2018 8:00 AM  Author: Nunzio Cobbs, MD Author Type: Physician Filed: 06/19/2018 9:23 PM  Note Status: Signed Cosign: Cosign Not Required Encounter Date: 06/16/2018  Editor: Emily Cobbs, MD (Physician)  Prior Versions: 1. Emily Lopez CMA (Certified Psychologist, sport and exercise) at 06/16/2018 8:30 AM - Sign when Signing Visit    GYNECOLOGY  VISIT   HPI: 63 y.o.   Married  [place] American  female   [phone] with Patient's last menstrual period was 06/11/2011 (within years).   here as a new patient referred from Dr. Nolon Lopez for PMB once about a month ago; per patient feels like she will start her period at times     Woke up one morning a month ago and noted blood in her bed.   She states she has fibroids.   Pelvic US report from 04/29/18 in Epic CLINICAL DATA: Postmenopausal bleeding.  EXAM: TRANSABDOMINAL AND TRANSVAGINAL ULTRASOUND OF PELVIS  TECHNIQUE: Both transabdominal and transvaginal ultrasound examinations of the pelvis were performed. Transabdominal technique was performed for global imaging of the pelvis including uterus, ovaries, adnexal regions, and pelvic cul-de-sac. It was necessary to proceed with endovaginal exam following the transabdominal exam to visualize  the endometrium and ovaries.  COMPARISON: 12/17/2013  FINDINGS: Uterus  Measurements: 9.4 x 4.2 x 5.7 cm = volume: 119 mL. Multiple small uterine fibroids are again seen involving the uterus diffusely, largest measuring 3.5 cm. These show no significant change compared to previous study.  Endometrium  Thickness: 8 mm. Several fibroids have submucosal components indenting the endometrium.  Right ovary  Measurements: 3.4 x 2.7 x 2.5 cm = volume: 11.9 mL. Normal appearance/no adnexal mass.  Left ovary  Measurements: 3.1 x 1.6 x 2.5 cm = volume: 6.5 mL. A 1.4 cm benign-appearing left ovarian cyst is seen, also without change since previous study.  Other findings  No abnormal free fluid.  IMPRESSION: Endometrial thickness measures 8 mm. In the setting of post-menopausal bleeding, endometrial sampling is indicated to exclude carcinoma. If results are benign, sonohysterogram should be considered for focal lesion work-up. (Ref: Radiological Reasoning: Algorithmic Workup of Abnormal Vaginal Bleeding with Endovaginal Sonography and Sonohysterography. AJR 2008; 245:Y09-98).  Multiple small uterine fibroids measuring up to 3.5 cm, without significant change since previous study.  1.4 cm left ovarian cyst, also stable since prior exam. This has benign characteristics and is a common finding in postmenopausal females. No imaging follow up is required. This follows consensus guidelines: Simple Adnexal Cysts: SRU Consensus Conference Update on Follow-up and Reporting. Radiology 2019; [id number].   Electronically Signed By: Emily Lopez M.D. On:  04/29/2018 16:40   GYNECOLOGIC HISTORY: Patient's last menstrual period was 06/11/2011 (within years). Contraception:  postmenopausal Menopausal hormone therapy:  none Last mammogram:  03/27/18 per patient at [facility] -- will call for report Last pap smear:   08/14/17 Pap and HR HPV negative                OB  History    Gravida  4   Para  3   Term  0   Preterm  0   AB  1   Living  3     SAB  0   TAB  0   Ectopic  0   Multiple  0   Live Births  0                  Patient Active Problem List   Diagnosis Date Noted  . Diet-controlled diabetes mellitus ([place]) 05/22/2017  . Class 1 obesity due to excess calories with serious comorbidity and body mass index (BMI) of 30.0 to 30.9 in adult 05/22/2017  . Right leg swelling 10/21/2016  . VITAMIN D DEFICIENCY 03/13/2009  . DEPRESSIVE DISORDER 08/24/2008  . OVERWEIGHT 07/25/2008  . ATTENTION DEFICIT DISORDER, ADULT 07/25/2008  . SYSTOLIC MURMUR 54/56/2563  . GERD 07/20/2007  . ANEMIA, IRON DEFICIENCY, UNSPEC. 08/07/2006  . Essential hypertension 08/07/2006        Past Medical History:  Diagnosis Date  . Anxiety   . Arthritis   . Attention deficit disorder without mention of hyperactivity   . Depression   . Esophageal reflux   . Hypertension   . Iron deficiency anemia, unspecified   . Myalgia and myositis, unspecified   . Pneumonia    2008 or 2009  . Undiagnosed cardiac murmurs   . Unspecified vitamin D deficiency     History reviewed. No pertinent surgical history.        Current Outpatient Medications  Medication Sig Dispense Refill  . amLODipine (NORVASC) 10 MG tablet Take 1 tablet (10 mg total) by mouth daily. 90 tablet 1  . aspirin 81 MG EC tablet Take 1 tablet (81 mg total) by mouth daily. Swallow whole. 90 tablet 3  . lisinopril (PRINIVIL,ZESTRIL) 20 MG tablet Take 1 tablet (20 mg total) by mouth daily. 90 tablet 1  . meloxicam (MOBIC) 7.5 MG tablet Take 1 tablet (7.5 mg total) by mouth daily. 15 tablet 0  . Multiple Vitamin (MULTIVITAMIN) LIQD Take 5 mLs by mouth daily. [place]  Nutri burst Liquid vitamin    . naproxen (NAPROSYN) 500 MG tablet Take 1 tablet (500 mg total) by mouth 2 (two) times daily. 30 tablet 0  . Prenatal Vit-Fe Fumarate-FA (PRENATAL MULTIVITAMIN) TABS tablet Take  1 tablet by mouth daily at 12 noon.    . traMADol (ULTRAM) 50 MG tablet Take 1 tablet (50 mg total) by mouth 2 (two) times daily. 30 tablet 0  . GARLIC PO Take 1 tablet by mouth.    . methocarbamol (ROBAXIN) 500 MG tablet Take 1 tablet (500 mg total) by mouth every 6 (six) hours as needed for muscle spasms. (Patient not taking: Reported on 06/16/2018) 20 tablet 0   No current facility-administered medications for this visit.      ALLERGIES: No known allergies       Family History  Problem Relation Age of Onset  . Hypertension Mother   . Diabetes Mother   . Arthritis Mother   . Hyperlipidemia Mother   . Lung cancer Father   .  Heart attack Father   . Diabetes Brother   . Hypertension Sister   . Breast cancer Sister   . Hernia Sister     Social History        Socioeconomic History  . Marital status: Married    Spouse name: Not on file  . Number of children: Not on file  . Years of education: Not on file  . Highest education level: Not on file  Occupational History  . Not on file  Social Needs  . Financial resource strain: Not on file  . Food insecurity:    Worry: Not on file    Inability: Not on file  . Transportation needs:    Medical: Not on file    Non-medical: Not on file  Tobacco Use  . Smoking status: Never Smoker  . Smokeless tobacco: Former Systems developer    Types: Snuff  Substance and Sexual Activity  . Alcohol use: Yes    Alcohol/week: 0.0 standard drinks    Comment: 2 drinks  . Drug use: Never  . Sexual activity: Not Currently    Birth control/protection: None, Post-menopausal  Lifestyle  . Physical activity:    Days per week: Not on file    Minutes per session: Not on file  . Stress: Not on file  Relationships  . Social connections:    Talks on phone: Not on file    Gets together: Not on file    Attends religious service: Not on file    Active member of club or organization: Not on file    Attends  meetings of clubs or organizations: Not on file    Relationship status: Not on file  . Intimate partner violence:    Fear of current or ex partner: Not on file    Emotionally abused: Not on file    Physically abused: Not on file    Forced sexual activity: Not on file  Other Topics Concern  . Not on file  Social History Narrative  . Not on file    Review of Systems  Constitutional: Negative.   HENT: Negative.   Eyes: Negative.   Respiratory: Negative.   Cardiovascular: Negative.   Gastrointestinal: Negative.   Endocrine: Negative.   Genitourinary: Negative.   Musculoskeletal: Negative.   Skin: Negative.   Allergic/Immunologic: Negative.   Neurological: Negative.   Hematological: Negative.   Psychiatric/Behavioral: Negative.     PHYSICAL EXAMINATION:    BP 132/70 (BP Location: Right Arm, Patient Position: Sitting, Cuff Size: Large)   Pulse 76   Resp 16   Ht 5\' 6"  (1.676 m)   Wt 190 lb (86.2 kg)   LMP 06/11/2011 (Within Years)   BMI 30.67 kg/m     General appearance: alert, cooperative and appears stated age Head: Normocephalic, without obvious abnormality, atraumatic Neck: no adenopathy, supple, symmetrical, trachea midline and thyroid normal to inspection and palpation Lungs: clear to auscultation bilaterally Heart: regular rate and rhythm Abdomen: soft, non-tender, no masses,  no organomegaly Extremities: extremities normal, atraumatic, no cyanosis or edema No abnormal inguinal nodes palpated Neurologic: Grossly normal  Pelvic: External genitalia:  no lesions              Urethra:  normal appearing urethra with no masses, tenderness or lesions              Bartholins and Skenes: normal                 Vagina: normal appearing vagina with normal color and  discharge, no lesions              Cervix: no lesions.  Stenotic cervix.                Bimanual Exam:  Uterus:  normal size, contour, position, consistency, mobility, non-tender.  Bm exam  limited by inability to relax.               Adnexa: no mass, fullness, tenderness          Attempted EMB.  Aborted procedure.  Consent for procedure.  Sterile prep of cervix with Hibiclens.  Paracervical block with 10 cc 1% lidocaine, lot number [id number], expiration 4/23. Tenaculum to anterior cervical lip.  Unable to pass os finder or hegar dilator through external os.  Patient also uncomfortable with palpation of this area.  Procedure abandoned.  Chaperone was present for exam.  ASSESSMENT  Postmenopausal bleeding.  Thickened endometrium.  Fibroids, some submucosal.  Cervical stenosis.  Left ovary with 1.4 cm benign cyst, stable.   PLAN  Discussion of postmenopausal bleeding and benign and malignant causes. Discussion of hysteroscopy with Myosure polypectomy, dilation and curettage under US guidance. Risks, benefits, and alternatives reviewed. Risks include but are not limited to bleeding, infection, damage to surrounding organs including uterine perforation requiring hospitalization and laparoscopy, pulmonary edema, reaction to anesthesia, DVT, PE, need for further treatment and surgery including repeat hysteroscopy, hysterectomy or medical therapy.   The possibility of inability to complete the surgery due to cervical stenosis also discussed. Surgical expectations and recovery discussed.  Patient wishes to proceed.   An After Visit Summary was printed and given to the patient.  __60____ minutes face to face time of which over 50% was spent in counseling.

## 2018-07-07 ENCOUNTER — Encounter (HOSPITAL_BASED_OUTPATIENT_CLINIC_OR_DEPARTMENT_OTHER): Payer: Self-pay | Admitting: *Deleted

## 2018-07-07 ENCOUNTER — Other Ambulatory Visit: Payer: Self-pay

## 2018-07-07 ENCOUNTER — Telehealth: Payer: Self-pay | Admitting: Family Medicine

## 2018-07-07 NOTE — Progress Notes (Signed)
Spoke with patient via telephone for pre op interview. NPO after MN. Will take Norvasc AM of surgery with a sip of water. Will need CBC and BMET AM of surgery. Arrival time 0830.

## 2018-07-07 NOTE — Telephone Encounter (Signed)
Called pt and made an appt for the requested date of 07/31/18 at 11:20 for a hospital F/U for after surgery with Dr. Nolon Rod. I advised of time and late policy. Pt acknowledged.

## 2018-07-14 ENCOUNTER — Other Ambulatory Visit: Payer: Self-pay

## 2018-07-14 ENCOUNTER — Ambulatory Visit (HOSPITAL_BASED_OUTPATIENT_CLINIC_OR_DEPARTMENT_OTHER): Admitting: Certified Registered"

## 2018-07-14 ENCOUNTER — Ambulatory Visit (HOSPITAL_COMMUNITY)
Admission: RE | Admit: 2018-07-14 | Discharge: 2018-07-14 | Disposition: A | Discharge status: Home / Self Care | Source: Ambulatory Visit | Attending: Obstetrics and Gynecology | Admitting: Obstetrics and Gynecology

## 2018-07-14 ENCOUNTER — Ambulatory Visit (HOSPITAL_BASED_OUTPATIENT_CLINIC_OR_DEPARTMENT_OTHER)
Admission: RE | Admit: 2018-07-14 | Discharge: 2018-07-14 | Disposition: A | Discharge status: Home / Self Care | Attending: Obstetrics and Gynecology | Admitting: Obstetrics and Gynecology

## 2018-07-14 ENCOUNTER — Encounter (HOSPITAL_BASED_OUTPATIENT_CLINIC_OR_DEPARTMENT_OTHER): Payer: Self-pay | Admitting: Certified Registered"

## 2018-07-14 ENCOUNTER — Encounter (HOSPITAL_BASED_OUTPATIENT_CLINIC_OR_DEPARTMENT_OTHER)
Admission: RE | Disposition: A | Discharge status: Home / Self Care | Payer: Self-pay | Source: Home / Self Care | Attending: Obstetrics and Gynecology

## 2018-07-14 DIAGNOSIS — N882 Stricture and stenosis of cervix uteri: Secondary | ICD-10-CM | POA: Diagnosis not present

## 2018-07-14 DIAGNOSIS — D509 Iron deficiency anemia, unspecified: Secondary | ICD-10-CM | POA: Diagnosis not present

## 2018-07-14 DIAGNOSIS — N858 Other specified noninflammatory disorders of uterus: Secondary | ICD-10-CM | POA: Diagnosis not present

## 2018-07-14 DIAGNOSIS — E119 Type 2 diabetes mellitus without complications: Secondary | ICD-10-CM | POA: Diagnosis not present

## 2018-07-14 DIAGNOSIS — Z79899 Other long term (current) drug therapy: Secondary | ICD-10-CM | POA: Diagnosis not present

## 2018-07-14 DIAGNOSIS — Z791 Long term (current) use of non-steroidal anti-inflammatories (NSAID): Secondary | ICD-10-CM | POA: Diagnosis not present

## 2018-07-14 DIAGNOSIS — Z6830 Body mass index (BMI) 30.0-30.9, adult: Secondary | ICD-10-CM | POA: Insufficient documentation

## 2018-07-14 DIAGNOSIS — E669 Obesity, unspecified: Secondary | ICD-10-CM | POA: Diagnosis not present

## 2018-07-14 DIAGNOSIS — Z7982 Long term (current) use of aspirin: Secondary | ICD-10-CM | POA: Insufficient documentation

## 2018-07-14 DIAGNOSIS — N950 Postmenopausal bleeding: Secondary | ICD-10-CM | POA: Insufficient documentation

## 2018-07-14 DIAGNOSIS — N8500 Endometrial hyperplasia, unspecified: Secondary | ICD-10-CM | POA: Diagnosis not present

## 2018-07-14 DIAGNOSIS — I10 Essential (primary) hypertension: Secondary | ICD-10-CM | POA: Insufficient documentation

## 2018-07-14 DIAGNOSIS — M1990 Unspecified osteoarthritis, unspecified site: Secondary | ICD-10-CM | POA: Diagnosis not present

## 2018-07-14 HISTORY — DX: Prediabetes: R73.03

## 2018-07-14 HISTORY — PX: OPERATIVE ULTRASOUND: SHX5996

## 2018-07-14 HISTORY — PX: DILATATION & CURETTAGE/HYSTEROSCOPY WITH MYOSURE: SHX6511

## 2018-07-14 LAB — CBC
HCT: 38.9 % (ref 36.0–46.0)
Hemoglobin: 12.6 g/dL (ref 12.0–15.0)
MCH: 27.1 pg (ref 26.0–34.0)
MCHC: 32.4 g/dL (ref 30.0–36.0)
MCV: 83.7 fL (ref 80.0–100.0)
Platelets: 328 10*3/uL (ref 150–400)
RBC: 4.65 MIL/uL (ref 3.87–5.11)
RDW: 20 % — ABNORMAL HIGH (ref 11.5–15.5)
WBC: 5.3 10*3/uL (ref 4.0–10.5)
nRBC: 0 % (ref 0.0–0.2)

## 2018-07-14 LAB — BASIC METABOLIC PANEL
ANION GAP: 8 (ref 5–15)
BUN: 18 mg/dL (ref 8–23)
CO2: 26 mmol/L (ref 22–32)
Calcium: 9.2 mg/dL (ref 8.9–10.3)
Chloride: 106 mmol/L (ref 98–111)
Creatinine, Ser: 0.97 mg/dL (ref 0.44–1.00)
GFR calc Af Amer: >60 mL/min (ref >60)
GFR calc non Af Amer: >60 mL/min (ref >60)
GLUCOSE: 96 mg/dL (ref 70–99)
Potassium: 3.5 mmol/L (ref 3.5–5.1)
Sodium: 140 mmol/L (ref 135–145)

## 2018-07-14 SURGERY — DILATATION & CURETTAGE/HYSTEROSCOPY WITH MYOSURE
Anesthesia: General | Site: Vagina

## 2018-07-14 MED ORDER — SODIUM CHLORIDE 0.9 % IV SOLN
2.0000 g | INTRAVENOUS | Status: AC
  Administered 2018-07-14: 2 g via INTRAVENOUS
  Filled 2018-07-14: qty 2

## 2018-07-14 MED ORDER — ONDANSETRON HCL 4 MG/2ML IJ SOLN
INTRAMUSCULAR | Status: DC | PRN
  Administered 2018-07-14: 4 mg via INTRAVENOUS

## 2018-07-14 MED ORDER — MIDAZOLAM HCL 2 MG/2ML IJ SOLN
INTRAMUSCULAR | Status: AC
  Filled 2018-07-14: qty 2

## 2018-07-14 MED ORDER — FENTANYL CITRATE (PF) 100 MCG/2ML IJ SOLN
25.0000 ug | INTRAMUSCULAR | Status: DC | PRN
  Filled 2018-07-14: qty 1

## 2018-07-14 MED ORDER — ACETAMINOPHEN 500 MG PO TABS
ORAL_TABLET | ORAL | Status: AC
  Filled 2018-07-14: qty 2

## 2018-07-14 MED ORDER — LIDOCAINE 2% (20 MG/ML) 5 ML SYRINGE
INTRAMUSCULAR | Status: AC
  Filled 2018-07-14: qty 5

## 2018-07-14 MED ORDER — DEXAMETHASONE SODIUM PHOSPHATE 10 MG/ML IJ SOLN
INTRAMUSCULAR | Status: DC | PRN
  Administered 2018-07-14: 10 mg via INTRAVENOUS

## 2018-07-14 MED ORDER — LACTATED RINGERS IV SOLN
INTRAVENOUS | Status: DC
  Administered 2018-07-14: 08:00:00 via INTRAVENOUS
  Filled 2018-07-14: qty 1000

## 2018-07-14 MED ORDER — LIDOCAINE HCL 1 % IJ SOLN
INTRAMUSCULAR | Status: DC | PRN
  Administered 2018-07-14: 10 mL

## 2018-07-14 MED ORDER — SODIUM CHLORIDE 0.9 % IV SOLN
INTRAVENOUS | Status: AC
  Filled 2018-07-14: qty 2

## 2018-07-14 MED ORDER — KETOROLAC TROMETHAMINE 30 MG/ML IJ SOLN
INTRAMUSCULAR | Status: DC | PRN
  Administered 2018-07-14: 30 mg via INTRAVENOUS

## 2018-07-14 MED ORDER — ONDANSETRON HCL 4 MG/2ML IJ SOLN
INTRAMUSCULAR | Status: AC
  Filled 2018-07-14: qty 2

## 2018-07-14 MED ORDER — PROPOFOL 10 MG/ML IV BOLUS
INTRAVENOUS | Status: AC
  Filled 2018-07-14: qty 20

## 2018-07-14 MED ORDER — MIDAZOLAM HCL 5 MG/5ML IJ SOLN
INTRAMUSCULAR | Status: DC | PRN
  Administered 2018-07-14: 2 mg via INTRAVENOUS

## 2018-07-14 MED ORDER — SILVER NITRATE-POT NITRATE 75-25 % EX MISC
CUTANEOUS | Status: DC | PRN
  Administered 2018-07-14: 2

## 2018-07-14 MED ORDER — ACETAMINOPHEN 500 MG PO TABS
1000.0000 mg | ORAL_TABLET | Freq: Once | ORAL | Status: AC
  Administered 2018-07-14: 1000 mg via ORAL
  Filled 2018-07-14: qty 2

## 2018-07-14 MED ORDER — PROPOFOL 10 MG/ML IV BOLUS
INTRAVENOUS | Status: DC | PRN
  Administered 2018-07-14: 130 mg via INTRAVENOUS

## 2018-07-14 MED ORDER — SCOPOLAMINE 1 MG/3DAYS TD PT72
MEDICATED_PATCH | TRANSDERMAL | Status: AC
  Filled 2018-07-14: qty 1

## 2018-07-14 MED ORDER — SODIUM CHLORIDE 0.9 % IR SOLN
Status: DC | PRN
  Administered 2018-07-14: 3000 mL

## 2018-07-14 MED ORDER — LACTATED RINGERS IV SOLN
INTRAVENOUS | Status: DC
  Filled 2018-07-14: qty 1000

## 2018-07-14 MED ORDER — DEXAMETHASONE SODIUM PHOSPHATE 10 MG/ML IJ SOLN
INTRAMUSCULAR | Status: AC
  Filled 2018-07-14: qty 1

## 2018-07-14 MED ORDER — FENTANYL CITRATE (PF) 100 MCG/2ML IJ SOLN
INTRAMUSCULAR | Status: AC
  Filled 2018-07-14: qty 2

## 2018-07-14 MED ORDER — LIDOCAINE 2% (20 MG/ML) 5 ML SYRINGE
INTRAMUSCULAR | Status: DC | PRN
  Administered 2018-07-14: 80 mg via INTRAVENOUS

## 2018-07-14 MED ORDER — FENTANYL CITRATE (PF) 100 MCG/2ML IJ SOLN
INTRAMUSCULAR | Status: DC | PRN
  Administered 2018-07-14 (×2): 25 ug via INTRAVENOUS

## 2018-07-14 MED ORDER — KETOROLAC TROMETHAMINE 30 MG/ML IJ SOLN
INTRAMUSCULAR | Status: AC
  Filled 2018-07-14: qty 1

## 2018-07-14 SURGICAL SUPPLY — 21 items
BLADE SURG 11 STRL SS (BLADE) ×2 IMPLANT
CANISTER SUCT 3000ML PPV (MISCELLANEOUS) ×4 IMPLANT
CATH ROBINSON RED A/P 16FR (CATHETERS) ×4 IMPLANT
COVER WAND RF STERILE (DRAPES) ×4 IMPLANT
DEVICE MYOSURE LITE (MISCELLANEOUS) IMPLANT
DEVICE MYOSURE REACH (MISCELLANEOUS) IMPLANT
DILATOR CANAL MILEX (MISCELLANEOUS) ×2 IMPLANT
GAUZE 4X4 16PLY RFD (DISPOSABLE) ×4 IMPLANT
GLOVE BIO SURGEON STRL SZ 6.5 (GLOVE) ×4 IMPLANT
GLOVE BIO SURGEONS STRL SZ 6.5 (GLOVE) ×2
GLOVE BIOGEL PI IND STRL 6.5 (GLOVE) IMPLANT
GLOVE BIOGEL PI INDICATOR 6.5 (GLOVE) ×2
GOWN STRL REUS W/TWL LRG LVL3 (GOWN DISPOSABLE) ×6 IMPLANT
IV NS IRRIG 3000ML ARTHROMATIC (IV SOLUTION) ×4 IMPLANT
KIT PROCEDURE FLUENT (KITS) ×4 IMPLANT
MYOSURE XL FIBROID (MISCELLANEOUS)
PACK VAGINAL MINOR WOMEN LF (CUSTOM PROCEDURE TRAY) ×4 IMPLANT
PAD OB MATERNITY 4.3X12.25 (PERSONAL CARE ITEMS) ×4 IMPLANT
SEAL ROD LENS SCOPE MYOSURE (ABLATOR) ×4 IMPLANT
SYSTEM TISS REMOVAL MYOSURE XL (MISCELLANEOUS) IMPLANT
TOWEL OR 17X24 6PK STRL BLUE (TOWEL DISPOSABLE) ×8 IMPLANT

## 2018-07-14 NOTE — Progress Notes (Signed)
Update to History and Physical  No marked change in status since office preop visit.  No further vaginal bleeding.  Patient examined.  OK to proceed with surgery.

## 2018-07-14 NOTE — Transfer of Care (Signed)
Immediate Anesthesia Transfer of Care Note  Patient: Emily Lopez  Procedure(s) Performed: DILATATION & CURETTAGE/HYSTEROSCOPY with ultrasound guidance (N/A Vagina ) OPERATIVE ULTRASOUND ultrasound guided hysteroscopy (N/A Abdomen)  Patient Location: PACU  Anesthesia Type:General  Level of Consciousness: drowsy  Airway & Oxygen Therapy: Patient Spontanous Breathing and Patient connected to face mask oxygen  Post-op Assessment: Report given to RN and Post -op Vital signs reviewed and stable  Post vital signs: Reviewed and stable  Last Vitals:  Vitals Value Taken Time  BP 118/70 07/14/2018 11:03 AM  Temp 36.3 C 07/14/2018 11:03 AM  Pulse 75 07/14/2018 11:06 AM  Resp 18 07/14/2018 11:06 AM  SpO2 99 % 07/14/2018 11:06 AM  Vitals shown include unvalidated device data.  Last Pain:  Vitals:   07/14/18 0758  TempSrc: Oral         Complications: No apparent anesthesia complications

## 2018-07-14 NOTE — Discharge Instructions (Signed)
Dilation and Curettage or Vacuum Curettage, Care After These instructions give you information about caring for yourself after your procedure. Your doctor may also give you more specific instructions. Call your doctor if you have any problems or questions after your procedure. Follow these instructions at home: Activity  Do not drive or use heavy machinery while taking prescription pain medicine.  For 24 hours after your procedure, avoid driving.  Take short walks often, followed by rest periods. Ask your doctor what activities are safe for you. After one or two days, you may be able to return to your normal activities.  Do not lift anything that is heavier than 10 lb (4.5 kg) until your doctor approves.  For at least 2 weeks, or as long as told by your doctor: ? Do not douche. ? Do not use tampons. ? Do not have sex. General instructions   Take over-the-counter and prescription medicines only as told by your doctor. This is very important if you take blood thinning medicine.  Do not take baths, swim, or use a hot tub until your doctor approves. Take showers instead of baths.  Wear compression stockings as told by your doctor.  It is up to you to get the results of your procedure. Ask your doctor when your results will be ready.  Keep all follow-up visits as told by your doctor. This is important. Contact a doctor if:  You have very bad cramps that get worse or do not get better with medicine.  You have very bad pain in your belly (abdomen).  You cannot drink fluids without throwing up (vomiting).  You get pain in a different part of the area between your belly and thighs (pelvis).  You have bad-smelling discharge from your vagina.  You have a rash. Get help right away if:  You are bleeding a lot from your vagina. A lot of bleeding means soaking more than one sanitary pad in an hour, for 2 hours in a row.  You have clumps of blood (blood clots) coming from your  vagina.  You have a fever or chills.  Your belly feels very tender or hard.  You have chest pain.  You have trouble breathing.  You cough up blood.  You feel dizzy.  You feel light-headed.  You pass out (faint).  You have pain in your neck or shoulder area. Summary  Take short walks often, followed by rest periods. Ask your doctor what activities are safe for you. After one or two days, you may be able to return to your normal activities.  Do not lift anything that is heavier than 10 lb (4.5 kg) until your doctor approves.  Do not take baths, swim, or use a hot tub until your doctor approves. Take showers instead of baths.  Contact your doctor if you have any symptoms of infection, like bad-smelling discharge from your vagina. This information is not intended to replace advice given to you by your health care provider. Make sure you discuss any questions you have with your health care provider. Document Released: 03/05/2008 Document Revised: 02/12/2016 Document Reviewed: 02/12/2016 Elsevier Interactive Patient Education  2019 [place]. Hysteroscopy, Care After This sheet gives you information about how to care for yourself after your procedure. Your health care provider may also give you more specific instructions. If you have problems or questions, contact your health care provider. What can I expect after the procedure? After the procedure, it is common to have:  Cramping.  Bleeding. This can  vary from light spotting to menstrual-like bleeding. Follow these instructions at home: Activity  Rest for 1-2 days after the procedure.  Do not douche, use tampons, or have sex for 2 weeks after the procedure, or until your health care provider approves.  Do not drive for 24 hours after the procedure, or for as long as told by your health care provider.  Do not drive, use heavy machinery, or drink alcohol while taking prescription pain medicines. Medicines   Take  over-the-counter and prescription medicines only as told by your health care provider.  Do not take aspirin during recovery. It can increase the risk of bleeding. General instructions  Do not take baths, swim, or use a hot tub until your health care provider approves. Take showers instead of baths for 2 weeks, or for as long as told by your health care provider.  To prevent or treat constipation while you are taking prescription pain medicine, your health care provider may recommend that you: ? Drink enough fluid to keep your urine clear or pale yellow. ? Take over-the-counter or prescription medicines. ? Eat foods that are high in fiber, such as fresh fruits and vegetables, whole grains, and beans. ? Limit foods that are high in fat and processed sugars, such as fried and sweet foods.  Keep all follow-up visits as told by your health care provider. This is important. Contact a health care provider if:  You feel dizzy or lightheaded.  You feel nauseous.  You have abnormal vaginal discharge.  You have a rash.  You have pain that does not get better with medicine.  You have chills. Get help right away if:  You have bleeding that is heavier than a normal menstrual period.  You have a fever.  You have pain or cramps that get worse.  You develop new abdominal pain.  You faint.  You have pain in your shoulders.  You have shortness of breath. Summary  After the procedure, you may have cramping and some vaginal bleeding.  Do not douche, use tampons, or have sex for 2 weeks after the procedure, or until your health care provider approves.  Do not take baths, swim, or use a hot tub until your health care provider approves. Take showers instead of baths for 2 weeks, or for as long as told by your health care provider.  Report any unusual symptoms to your health care provider.  Keep all follow-up visits as told by your health care provider. This is important. This information  is not intended to replace advice given to you by your health care provider. Make sure you discuss any questions you have with your health care provider. Document Released: 03/17/2013 Document Revised: 06/25/2016 Document Reviewed: 06/25/2016 Elsevier Interactive Patient Education  2019 [place] Anesthesia Home Care Instructions  Activity: Get plenty of rest for the remainder of the day. A responsible individual must stay with you for 24 hours following the procedure.  For the next 24 hours, DO NOT: -Drive a car -Paediatric nurse -Drink alcoholic beverages -Take any medication unless instructed by your physician -Make any legal decisions or sign important papers.  Meals: Start with liquid foods such as gelatin or soup. Progress to regular foods as tolerated. Avoid greasy, spicy, heavy foods. If nausea and/or vomiting occur, drink only clear liquids until the nausea and/or vomiting subsides. Call your physician if vomiting continues.  Special Instructions/Symptoms: Your throat may feel dry or sore from the anesthesia or the breathing tube placed  in your throat during surgery. If this causes discomfort, gargle with warm salt water. The discomfort should disappear within 24 hours.  May take Tylenol after 2 PM if needed. May take Ibuprofen, Advil, Motrin, or Aleve after 5 PM if needed.

## 2018-07-14 NOTE — Anesthesia Preprocedure Evaluation (Addendum)
Anesthesia Evaluation  Patient identified by MRN, date of birth, ID band Patient awake    Reviewed: Allergy & Precautions, NPO status , Patient's Chart, lab work & pertinent test results  Airway Mallampati: III  TM Distance: >3 FB Neck ROM: Full    Dental no notable dental hx. (+) Missing,    Pulmonary former smoker,    Pulmonary exam normal breath sounds clear to auscultation       Cardiovascular hypertension, Pt. on medications negative cardio ROS Normal cardiovascular exam Rhythm:Regular Rate:Normal  TTE 2014 - Left ventricle: The cavity size was normal. Wall thicknesswas normal. Systolic function was normal. The estimated ejection fraction was in the range of 55% to 60%. Wall motion was normal; there were no regional wall motionabnormalities. Doppler parameters are consistent withabnormal left ventricular relaxation (grade 1 diastolic dysfunction). Doppler parameters are consistent with high ventricular filling pressure. - Mitral valve: Mild regurgitation.   Neuro/Psych PSYCHIATRIC DISORDERS Anxiety Depression negative neurological ROS     GI/Hepatic Neg liver ROS, GERD  Controlled,  Endo/Other  diabetes (diet controlled), Type 2  Renal/GU negative Renal ROS  negative genitourinary   Musculoskeletal  (+) Arthritis ,   Abdominal   Peds  (+) ADHD Hematology  (+) Blood dyscrasia, anemia ,   Anesthesia Other Findings   Reproductive/Obstetrics negative OB ROS                            Anesthesia Physical Anesthesia Plan  ASA: II  Anesthesia Plan: General   Post-op Pain Management:    Induction: Intravenous  PONV Risk Score and Plan: 3 and Dexamethasone, Ondansetron and Midazolam  Airway Management Planned: LMA  Additional Equipment:   Intra-op Plan:   Post-operative Plan: Extubation in OR  Informed Consent: I have reviewed the patients History and Physical, chart, labs  and discussed the procedure including the risks, benefits and alternatives for the proposed anesthesia with the patient or authorized representative who has indicated his/her understanding and acceptance.     Dental advisory given  Plan Discussed with: CRNA  Anesthesia Plan Comments:         Anesthesia Quick Evaluation

## 2018-07-14 NOTE — Anesthesia Procedure Notes (Signed)
Procedure Name: LMA Insertion Date/Time: 07/14/2018 10:19 AM Performed by: Gwyndolyn Saxon, CRNA Pre-anesthesia Checklist: Patient identified, Emergency Drugs available, Suction available and Patient being monitored Patient Re-evaluated:Patient Re-evaluated prior to induction Oxygen Delivery Method: Circle system utilized Preoxygenation: Pre-oxygenation with 100% oxygen Induction Type: IV induction Ventilation: Mask ventilation without difficulty LMA: LMA inserted LMA Size: 4.0 Number of attempts: 1 Placement Confirmation: positive ETCO2 and breath sounds checked- equal and bilateral Tube secured with: Tape Dental Injury: Teeth and Oropharynx as per pre-operative assessment

## 2018-07-14 NOTE — Op Note (Signed)
OPERATIVE REPORT  PREOPERATIVE DIAGNOSES:   Postmenopausal bleeding, thickened endometrium, cervical stenosis.   POSTOPERATIVE DIAGNOSES:   Postmenopausal bleeding, thickened endometrium, cervical stenosis.   PROCEDURE:  Ultrasound guided hysteroscopy with dilation and curettage.  SURGEON:  Lenard Galloway, MD  ANESTHESIA:  LMA, paracervical block with 10 mL of 1% lidocaine.  IV FLUIDS:   800 cc LR.  EBL:  5 cc.  URINE OUTPUT:   100 cc.  NORMAL SALINE DEFICIT:   5 cc.  COMPLICATIONS:  None.  INDICATIONS FOR THE PROCEDURE:     The patient is a 63 year old Gravida 1, Para 3 African American female who presents with postmenopausal bleeding, ultrasound showing multiple fibroids and an endometrium measuring 8 mm, and cervical stenosis on office exam.   A plan is now made to proceed with a hysteroscopy with dilation and curettage and potential Myosure resection of undometrium after risks, benefits and alternatives were reviewed.  FINDINGS:  Exam under anesthesia revealed an 8 week size uterus.  The uterus was sounded to just over 6 cm.  Hysteroscopy showed the right side of the uterus to be underdeveloped.  No tubal ostial opening could be identified.   There was a left uterine horn noted.  No endometrial masses, neither polyp formation or submucous fibroids were noted.  Endometrial currettings were scanty.   SPECIMENS:  Endometrial curettings were sent to Pathology.  PROCEDURE IN DETAIL:  The patient was reidentified in the preoperative hold area.  She did receive Ancef IV for antibiotic prophylaxis and she received TED hose and PAS stockings for DVT prophylaxis.  In the operating room, the patient was placed in the dorsal lithotomy position and then an LMA anesthetic was introduced.  The patient's lower abdomen, vagina and perineum were sterilely prepped with Hibiclens and the  patient's bladder was catheterized of urine.  She was sterilly draped.  An exam under anesthesia was  performed.  The ultrasound technician performed transabdominal ultrasound while the procedure was performed vaginally.   A speculum was placed inside the vagina and a single-tooth tenaculum was placed on the anterior cervical lip.  A paracervical block was performed with a total of 10 mL of 1% lidocaine plain.  The cervical os was incised with a  number 11 scalpel.  Mucousy liquid drained from the os. The os finder was used to dilate the cervical os.  The uterus was sounded.  The cervix was dilated to a # 23 Pratt dilator.  The MyoSure hysteroscope was then inserted inside the uterine cavity  under the continuous infusion of normal saline solution.  Findings are as noted above.   The MyoSure hysteroscope was removed.  The serrated curette was introduced  into the uterine cavity and the endometrium was curetted in all 4 quadrants.  A minimal amount of endometrial curettings was obtained.  This specimen was sent to Pathology.  The single-tooth tenaculum which had been placed on the anterior cervical lip was removed. Silver nitrate was used to treat the left tenaculum site. Hemostasis was then good, and all of the vaginal instruments were removed.  The patient was awakened and escorted to the recovery room in stable condition after she was cleansed of Hibiclens.  There were no complications to the procedure.  All needle, instrument and sponge counts were correct.  Lenard Galloway, MD

## 2018-07-15 ENCOUNTER — Encounter (HOSPITAL_BASED_OUTPATIENT_CLINIC_OR_DEPARTMENT_OTHER): Payer: Self-pay | Admitting: Obstetrics and Gynecology

## 2018-07-15 NOTE — Anesthesia Postprocedure Evaluation (Signed)
Anesthesia Post Note  Patient: Emily Lopez  Procedure(s) Performed: DILATATION & CURETTAGE/HYSTEROSCOPY with ultrasound guidance (N/A Vagina ) OPERATIVE ULTRASOUND ultrasound guided hysteroscopy (N/A Abdomen)     Patient location during evaluation: PACU Anesthesia Type: General Level of consciousness: awake and alert Pain management: pain level controlled Vital Signs Assessment: post-procedure vital signs reviewed and stable Respiratory status: spontaneous breathing, nonlabored ventilation, respiratory function stable and patient connected to nasal cannula oxygen Cardiovascular status: blood pressure returned to baseline and stable Postop Assessment: no apparent nausea or vomiting Anesthetic complications: no    Last Vitals:  Vitals:   07/14/18 1145 07/14/18 1225  BP: 137/83 (!) 152/83  Pulse: 60 69  Resp: 14 20  Temp:  36.5 C  SpO2: 95% 95%    Last Pain:  Vitals:   07/14/18 1225  TempSrc: Axillary  PainSc: 4                  Emily Lopez

## 2018-07-27 ENCOUNTER — Ambulatory Visit (INDEPENDENT_AMBULATORY_CARE_PROVIDER_SITE_OTHER): Admitting: Obstetrics and Gynecology

## 2018-07-27 ENCOUNTER — Other Ambulatory Visit: Payer: Self-pay

## 2018-07-27 ENCOUNTER — Encounter: Payer: Self-pay | Admitting: Obstetrics and Gynecology

## 2018-07-27 VITALS — BP 140/82 | HR 70 | Ht 66.0 in | Wt 192.0 lb

## 2018-07-27 DIAGNOSIS — Z9889 Other specified postprocedural states: Secondary | ICD-10-CM

## 2018-07-27 DIAGNOSIS — N856 Intrauterine synechiae: Secondary | ICD-10-CM

## 2018-07-27 NOTE — Patient Instructions (Signed)

## 2018-07-27 NOTE — Progress Notes (Signed)
GYNECOLOGY  VISIT   HPI: 63 y.o.   Married  [place] American  female   [phone] with Patient's last menstrual period was 06/11/2011 (within years).   here for 2 week follow up [place] with ultrasound guidance (N/A Vagina ) OPERATIVE ULTRASOUND ultrasound guided hysteroscopy for postmenopausal bleeding and a stenotic cervix.   Final pathology - blood, mucous, and inflammation.  Very little endometrium.   Findings at surgery were an underdeveloped right uterine side.   By ultrasound in November, she had an endometrium measuring 8 mm, fibroids, and a small stable simple left ovarian cyst.   States she had some light bleeding on 07/21/18.   Had preterm labor and delivery with second delivery.   States she had retained placenta with third delivery.     GYNECOLOGIC HISTORY: Patient's last menstrual period was 06/11/2011 (within years). Contraception: Postmenopausal Menopausal hormone therapy: None Last mammogram: 03-27-18 3D Neg/density B/BiRads1 Last pap smear: 08-14-17 Neg:Neg HR HPV        OB History    Gravida  4   Para  3   Term  0   Preterm  0   AB  1   Living  3     SAB  0   TAB  0   Ectopic  0   Multiple  0   Live Births  0              Patient Active Problem List   Diagnosis Date Noted  . Diet-controlled diabetes mellitus ([place]) 05/22/2017  . Class 1 obesity due to excess calories with serious comorbidity and body mass index (BMI) of 30.0 to 30.9 in adult 05/22/2017  . Right leg swelling 10/21/2016  . VITAMIN D DEFICIENCY 03/13/2009  . DEPRESSIVE DISORDER 08/24/2008  . OVERWEIGHT 07/25/2008  . ATTENTION DEFICIT DISORDER, ADULT 07/25/2008  . SYSTOLIC MURMUR 45/40/9811  . GERD 07/20/2007  . ANEMIA, IRON DEFICIENCY, UNSPEC. 08/07/2006  . Essential hypertension 08/07/2006    Past Medical History:  Diagnosis Date  . Anxiety   . Arthritis   . Attention deficit disorder without mention of hyperactivity   . Depression   .  Esophageal reflux   . Hypertension   . Iron deficiency anemia, unspecified   . Myalgia and myositis, unspecified   . Pneumonia    2008 or 2009  . Pre-diabetes   . Undiagnosed cardiac murmurs   . Unspecified vitamin D deficiency     Past Surgical History:  Procedure Laterality Date  . DILATATION & CURETTAGE/HYSTEROSCOPY WITH MYOSURE N/A 07/14/2018   Procedure: DILATATION & CURETTAGE/HYSTEROSCOPY with ultrasound guidance;  Surgeon: Nunzio Cobbs, MD;  Location: [facility];  Service: Gynecology;  Laterality: N/A;  ultrasound guidance needed. Follow previous case.  . NO PAST SURGERIES    . OPERATIVE ULTRASOUND N/A 07/14/2018   Procedure: OPERATIVE ULTRASOUND ultrasound guided hysteroscopy;  Surgeon: Nunzio Cobbs, MD;  Location: [facility];  Service: Gynecology;  Laterality: N/A;  ultrasound guided hysteroscopy/D&C due to cervical stenosis    Current Outpatient Medications  Medication Sig Dispense Refill  . amLODipine (NORVASC) 10 MG tablet Take 1 tablet (10 mg total) by mouth daily. 90 tablet 1  . aspirin 81 MG EC tablet Take 1 tablet (81 mg total) by mouth daily. Swallow whole. 90 tablet 3  . GARLIC PO Take 1 tablet by mouth.    Marland Kitchen lisinopril (PRINIVIL,ZESTRIL) 20 MG tablet Take 1 tablet (20 mg total) by mouth daily. [place]  tablet 1  . meloxicam (MOBIC) 7.5 MG tablet Take 1 tablet (7.5 mg total) by mouth daily. 15 tablet 0  . methocarbamol (ROBAXIN) 500 MG tablet Take 1 tablet (500 mg total) by mouth every 6 (six) hours as needed for muscle spasms. 20 tablet 0  . Multiple Vitamin (MULTIVITAMIN) LIQD Take 5 mLs by mouth daily. [place]  Nutri burst Liquid vitamin    . naproxen (NAPROSYN) 500 MG tablet Take 1 tablet (500 mg total) by mouth 2 (two) times daily. 30 tablet 0  . Prenatal Vit-Fe Fumarate-FA (PRENATAL MULTIVITAMIN) TABS tablet Take 1 tablet by mouth daily at 12 noon.    . traMADol (ULTRAM) 50 MG tablet Take 1 tablet (50 mg total) by  mouth 2 (two) times daily. 30 tablet 0   No current facility-administered medications for this visit.      ALLERGIES: No known allergies  Family History  Problem Relation Age of Onset  . Hypertension Mother   . Diabetes Mother   . Arthritis Mother   . Hyperlipidemia Mother   . Lung cancer Father   . Heart attack Father   . Diabetes Brother   . Hypertension Sister   . Breast cancer Sister   . Hernia Sister     Social History   Socioeconomic History  . Marital status: Married    Spouse name: Not on file  . Number of children: Not on file  . Years of education: Not on file  . Highest education level: Not on file  Occupational History  . Not on file  Social Needs  . Financial resource strain: Not on file  . Food insecurity:    Worry: Not on file    Inability: Not on file  . Transportation needs:    Medical: Not on file    Non-medical: Not on file  Tobacco Use  . Smoking status: Never Smoker  . Smokeless tobacco: Former Systems developer    Types: Snuff  Substance and Sexual Activity  . Alcohol use: Yes    Alcohol/week: 0.0 standard drinks    Comment: 2 drinks  . Drug use: Never  . Sexual activity: Not Currently    Birth control/protection: None, Post-menopausal  Lifestyle  . Physical activity:    Days per week: Not on file    Minutes per session: Not on file  . Stress: Not on file  Relationships  . Social connections:    Talks on phone: Not on file    Gets together: Not on file    Attends religious service: Not on file    Active member of club or organization: Not on file    Attends meetings of clubs or organizations: Not on file    Relationship status: Not on file  . Intimate partner violence:    Fear of current or ex partner: Not on file    Emotionally abused: Not on file    Physically abused: Not on file    Forced sexual activity: Not on file  Other Topics Concern  . Not on file  Social History Narrative  . Not on file    Review of Systems  All other  systems reviewed and are negative.   PHYSICAL EXAMINATION:    BP 140/82 (BP Location: Right Arm, Patient Position: Sitting, Cuff Size: Large)   Pulse 70   Ht 5\' 6"  (1.676 m)   Wt 192 lb (87.1 kg)   LMP 06/11/2011 (Within Years)   BMI 30.99 kg/m     General appearance: alert, cooperative  and appears stated age   Pelvic: External genitalia:  no lesions              Urethra:  normal appearing urethra with no masses, tenderness or lesions              Bartholins and Skenes: normal                 Vagina: normal appearing vagina with normal color and discharge, no lesions              Cervix: no lesions                Bimanual Exam:  Uterus:  normal size, contour, position, consistency, mobility, non-tender              Adnexa: no mass, fullness, tenderness           Chaperone was present for exam.  ASSESSMENT  Status post hysteroscopy with dilation and curettage under ultrasound guidance.  Asherman's syndrome.   PLAN  Surgical procedure, findings, and pathology report discussed.  Call for future postmenopausal bleeding.    An After Visit Summary was printed and given to the patient.

## 2018-07-31 ENCOUNTER — Inpatient Hospital Stay: Admitting: Family Medicine

## 2018-08-28 ENCOUNTER — Inpatient Hospital Stay: Admitting: Family Medicine

## 2018-11-27 ENCOUNTER — Other Ambulatory Visit: Payer: Self-pay | Admitting: Family Medicine

## 2018-12-23 ENCOUNTER — Encounter: Payer: Self-pay | Admitting: Family Medicine

## 2018-12-23 ENCOUNTER — Other Ambulatory Visit: Payer: Self-pay

## 2018-12-23 ENCOUNTER — Ambulatory Visit (INDEPENDENT_AMBULATORY_CARE_PROVIDER_SITE_OTHER): Admitting: Family Medicine

## 2018-12-23 VITALS — BP 128/70 | HR 88 | Temp 98.2°F | Resp 16 | Ht 66.0 in | Wt 194.8 lb

## 2018-12-23 DIAGNOSIS — I10 Essential (primary) hypertension: Secondary | ICD-10-CM

## 2018-12-23 DIAGNOSIS — E6609 Other obesity due to excess calories: Secondary | ICD-10-CM

## 2018-12-23 DIAGNOSIS — Z6831 Body mass index (BMI) 31.0-31.9, adult: Secondary | ICD-10-CM | POA: Diagnosis not present

## 2018-12-23 DIAGNOSIS — E119 Type 2 diabetes mellitus without complications: Secondary | ICD-10-CM | POA: Diagnosis not present

## 2018-12-23 MED ORDER — LISINOPRIL 20 MG PO TABS: 20.0000 mg | ORAL_TABLET | Freq: Every day | ORAL | 1 refills | Status: DC

## 2018-12-23 MED ORDER — AMLODIPINE BESYLATE 10 MG PO TABS: 10.0000 mg | ORAL_TABLET | Freq: Every day | ORAL | 1 refills | Status: DC

## 2018-12-23 NOTE — Patient Instructions (Signed)
Managing Your Hypertension Hypertension is commonly called high blood pressure. This is when the force of your blood pressing against the walls of your arteries is too strong. Arteries are blood vessels that carry blood from your heart throughout your body. Hypertension forces the heart to work harder to pump blood, and may cause the arteries to become narrow or stiff. Having untreated or uncontrolled hypertension can cause heart attack, stroke, kidney disease, and other problems. What are blood pressure readings? A blood pressure reading consists of a higher number over a lower number. Ideally, your blood pressure should be below 120/80. The first ("top") number is called the systolic pressure. It is a measure of the pressure in your arteries as your heart beats. The second ("bottom") number is called the diastolic pressure. It is a measure of the pressure in your arteries as the heart relaxes. What does my blood pressure reading mean? Blood pressure is classified into four stages. Based on your blood pressure reading, your health care provider may use the following stages to determine what type of treatment you need, if any. Systolic pressure and diastolic pressure are measured in a unit called mm Hg. Normal  Systolic pressure: below 120.  Diastolic pressure: below 80. Elevated  Systolic pressure: 120-129.  Diastolic pressure: below 80. Hypertension stage 1  Systolic pressure: 130-139.  Diastolic pressure: 80-89. Hypertension stage 2  Systolic pressure: 140 or above.  Diastolic pressure: 90 or above. What health risks are associated with hypertension? Managing your hypertension is an important responsibility. Uncontrolled hypertension can lead to:  A heart attack.  A stroke.  A weakened blood vessel (aneurysm).  Heart failure.  Kidney damage.  Eye damage.  Metabolic syndrome.  Memory and concentration problems. What changes can I make to manage my  hypertension? Hypertension can be managed by making lifestyle changes and possibly by taking medicines. Your health care provider will help you make a plan to bring your blood pressure within a normal range. Eating and drinking   Eat a diet that is high in fiber and potassium, and low in salt (sodium), added sugar, and fat. An example eating plan is called the DASH (Dietary Approaches to Stop Hypertension) diet. To eat this way: ? Eat plenty of fresh fruits and vegetables. Try to fill half of your plate at each meal with fruits and vegetables. ? Eat whole grains, such as whole wheat pasta, brown rice, or whole grain bread. Fill about one quarter of your plate with whole grains. ? Eat low-fat diary products. ? Avoid fatty cuts of meat, processed or cured meats, and poultry with skin. Fill about one quarter of your plate with lean proteins such as fish, chicken without skin, beans, eggs, and tofu. ? Avoid premade and processed foods. These tend to be higher in sodium, added sugar, and fat.  Reduce your daily sodium intake. Most people with hypertension should eat less than 1,500 mg of sodium a day.  Limit alcohol intake to no more than 1 drink a day for nonpregnant women and 2 drinks a day for men. One drink equals 12 oz of beer, 5 oz of wine, or 1 oz of hard liquor. Lifestyle  Work with your health care provider to maintain a healthy body weight, or to lose weight. Ask what an ideal weight is for you.  Get at least 30 minutes of exercise that causes your heart to beat faster (aerobic exercise) most days of the week. Activities may include walking, swimming, or biking.  Include exercise   to strengthen your muscles (resistance exercise), such as weight lifting, as part of your weekly exercise routine. Try to do these types of exercises for 30 minutes at least 3 days a week.  Do not use any products that contain nicotine or tobacco, such as cigarettes and e-cigarettes. If you need help quitting,  ask your health care provider.  Control any long-term (chronic) conditions you have, such as high cholesterol or diabetes. Monitoring  Monitor your blood pressure at home as told by your health care provider. Your personal target blood pressure may vary depending on your medical conditions, your age, and other factors.  Have your blood pressure checked regularly, as often as told by your health care provider. Working with your health care provider  Review all the medicines you take with your health care provider because there may be side effects or interactions.  Talk with your health care provider about your diet, exercise habits, and other lifestyle factors that may be contributing to hypertension.  Visit your health care provider regularly. Your health care provider can help you create and adjust your plan for managing hypertension. Will I need medicine to control my blood pressure? Your health care provider may prescribe medicine if lifestyle changes are not enough to get your blood pressure under control, and if:  Your systolic blood pressure is 130 or higher.  Your diastolic blood pressure is 80 or higher. Take medicines only as told by your health care provider. Follow the directions carefully. Blood pressure medicines must be taken as prescribed. The medicine does not work as well when you skip doses. Skipping doses also puts you at risk for problems. Contact a health care provider if:  You think you are having a reaction to medicines you have taken.  You have repeated (recurrent) headaches.  You feel dizzy.  You have swelling in your ankles.  You have trouble with your vision. Get help right away if:  You develop a severe headache or confusion.  You have unusual weakness or numbness, or you feel faint.  You have severe pain in your chest or abdomen.  You vomit repeatedly.  You have trouble breathing. Summary  Hypertension is when the force of blood pumping  through your arteries is too strong. If this condition is not controlled, it may put you at risk for serious complications.  Your personal target blood pressure may vary depending on your medical conditions, your age, and other factors. For most people, a normal blood pressure is less than 120/80.  Hypertension is managed by lifestyle changes, medicines, or both. Lifestyle changes include weight loss, eating a healthy, low-sodium diet, exercising more, and limiting alcohol. This information is not intended to replace advice given to you by your health care provider. Make sure you discuss any questions you have with your health care provider. Document Released: 02/19/2012 Document Revised: 09/18/2018 Document Reviewed: 04/24/2016 Elsevier Patient Education  2020 [employer]  

## 2018-12-23 NOTE — Progress Notes (Signed)
Established Patient Office Visit  Subjective:  Patient ID: Emily Lopez, female    DOB: 1956-05-02  Age: 63 y.o. MRN: [id number]  CC:  Chief Complaint  Patient presents with  . medication follow up    HPI Emily Lopez presents for   Hypertension: Patient here for follow-up of elevated blood pressure. She is not exercising and is adherent to low salt diet.  Blood pressure is well controlled at home. Cardiac symptoms none. Patient denies chest pain, claudication, dyspnea, fatigue, lower extremity edema, near-syncope, palpitations and syncope.  Cardiovascular risk factors: diabetes mellitus and hypertension. Use of agents associated with hypertension: none. History of target organ damage: none.  Wt Readings from Last 3 Encounters:  12/23/18 194 lb 12.8 oz (88.4 kg)  07/27/18 192 lb (87.1 kg)  07/14/18 188 lb 14.4 oz (85.7 kg)    Diabetes Mellitus: Patient presents for follow up of diabetes. Symptoms: none. Symptoms have stabilized. Patient denies foot ulcerations, hyperglycemia, hypoglycemia , increase appetite, paresthesia of the feet and polyuria.  Evaluation to date has been included: hemoglobin A1C.  Home sugars: patient does not check sugars. Treatment to date: more intensive attention to diet which has been effective.  Lab Results  Component Value Date   HGBA1C 6.2 (H) 12/23/2018   Obesity Body mass index is 31.44 kg/m. Wt Readings from Last 3 Encounters:  12/23/18 194 lb 12.8 oz (88.4 kg)  07/27/18 192 lb (87.1 kg)  07/14/18 188 lb 14.4 oz (85.7 kg)   Pt states that she is not losing weight She does not eat sugars She cut out bread and starches  Past Medical History:  Diagnosis Date  . Anxiety   . Arthritis   . Attention deficit disorder without mention of hyperactivity   . Depression   . Esophageal reflux   . Hypertension   . Iron deficiency anemia, unspecified   . Myalgia and myositis, unspecified   . Pneumonia    2008 or 2009  . Pre-diabetes   .  Undiagnosed cardiac murmurs   . Unspecified vitamin D deficiency     Past Surgical History:  Procedure Laterality Date  . DILATATION & CURETTAGE/HYSTEROSCOPY WITH MYOSURE N/A 07/14/2018   Procedure: DILATATION & CURETTAGE/HYSTEROSCOPY with ultrasound guidance;  Surgeon: Nunzio Cobbs, MD;  Location: [facility];  Service: Gynecology;  Laterality: N/A;  ultrasound guidance needed. Follow previous case.  . NO PAST SURGERIES    . OPERATIVE ULTRASOUND N/A 07/14/2018   Procedure: OPERATIVE ULTRASOUND ultrasound guided hysteroscopy;  Surgeon: Nunzio Cobbs, MD;  Location: [facility];  Service: Gynecology;  Laterality: N/A;  ultrasound guided hysteroscopy/D&C due to cervical stenosis    Family History  Problem Relation Age of Onset  . Hypertension Mother   . Diabetes Mother   . Arthritis Mother   . Hyperlipidemia Mother   . Lung cancer Father   . Heart attack Father   . Diabetes Brother   . Hypertension Sister   . Breast cancer Sister   . Hernia Sister     Social History   Socioeconomic History  . Marital status: Married    Spouse name: Not on file  . Number of children: Not on file  . Years of education: Not on file  . Highest education level: Not on file  Occupational History  . Not on file  Social Needs  . Financial resource strain: Not on file  . Food insecurity    Worry: Not on file  Inability: Not on file  . Transportation needs    Medical: Not on file    Non-medical: Not on file  Tobacco Use  . Smoking status: Never Smoker  . Smokeless tobacco: Former Systems developer    Types: Snuff  Substance and Sexual Activity  . Alcohol use: Yes    Alcohol/week: 0.0 standard drinks    Comment: 2 drinks  . Drug use: Never  . Sexual activity: Not Currently    Birth control/protection: None, Post-menopausal  Lifestyle  . Physical activity    Days per week: Not on file    Minutes per session: Not on file  . Stress: Not on file   Relationships  . Social Herbalist on phone: Not on file    Gets together: Not on file    Attends religious service: Not on file    Active member of club or organization: Not on file    Attends meetings of clubs or organizations: Not on file    Relationship status: Not on file  . Intimate partner violence    Fear of current or ex partner: Not on file    Emotionally abused: Not on file    Physically abused: Not on file    Forced sexual activity: Not on file  Other Topics Concern  . Not on file  Social History Narrative  . Not on file    Outpatient Medications Prior to Visit  Medication Sig Dispense Refill  . aspirin 81 MG EC tablet Take 1 tablet (81 mg total) by mouth daily. Swallow whole. 90 tablet 3  . Multiple Vitamin (MULTIVITAMIN) LIQD Take 5 mLs by mouth daily. Iaso  Nutri burst Liquid vitamin    . amLODipine (NORVASC) 10 MG tablet TAKE 1 TABLET DAILY 30 tablet 0  . lisinopril (ZESTRIL) 20 MG tablet TAKE 1 TABLET DAILY 30 tablet 0  . GARLIC PO Take 1 tablet by mouth.    . meloxicam (MOBIC) 7.5 MG tablet Take 1 tablet (7.5 mg total) by mouth daily. (Patient not taking: Reported on 12/23/2018) 15 tablet 0  . methocarbamol (ROBAXIN) 500 MG tablet Take 1 tablet (500 mg total) by mouth every 6 (six) hours as needed for muscle spasms. (Patient not taking: Reported on 12/23/2018) 20 tablet 0  . naproxen (NAPROSYN) 500 MG tablet Take 1 tablet (500 mg total) by mouth 2 (two) times daily. (Patient not taking: Reported on 12/23/2018) 30 tablet 0  . Prenatal Vit-Fe Fumarate-FA (PRENATAL MULTIVITAMIN) TABS tablet Take 1 tablet by mouth daily at 12 noon.    . traMADol (ULTRAM) 50 MG tablet Take 1 tablet (50 mg total) by mouth 2 (two) times daily. (Patient not taking: Reported on 12/23/2018) 30 tablet 0   No facility-administered medications prior to visit.     Allergies  Allergen Reactions  . No Known Allergies     ROS Review of Systems Review of Systems  Constitutional:  Negative for activity change, appetite change, chills and fever.  HENT: Negative for congestion, nosebleeds, trouble swallowing and voice change.   Respiratory: Negative for cough, shortness of breath and wheezing.   Gastrointestinal: Negative for diarrhea, nausea and vomiting.  Genitourinary: Negative for difficulty urinating, dysuria, flank pain and hematuria.  Musculoskeletal: Negative for back pain, joint swelling and neck pain.  Neurological: Negative for dizziness, speech difficulty, light-headedness and numbness.  See HPI. All other review of systems negative.     Objective:    Physical Exam  BP 128/70 (BP Location: Left Arm, Patient Position:  Sitting, Cuff Size: Normal)   Pulse 88   Temp 98.2 F (36.8 C) (Oral)   Resp 16   Ht 5' 6"  (1.676 m)   Wt 194 lb 12.8 oz (88.4 kg)   LMP 06/11/2011 (Within Years)   SpO2 97%   BMI 31.44 kg/m  Wt Readings from Last 3 Encounters:  12/23/18 194 lb 12.8 oz (88.4 kg)  07/27/18 192 lb (87.1 kg)  07/14/18 188 lb 14.4 oz (85.7 kg)   Physical Exam  Constitutional: Oriented to person, place, and time. Appears well-developed and well-nourished.  HENT:  Head: Normocephalic and atraumatic.  Eyes: Conjunctivae and EOM are normal.  Cardiovascular: Normal rate, regular rhythm, normal heart sounds and intact distal pulses.  No murmur heard. Pulmonary/Chest: Effort normal and breath sounds normal. No stridor. No respiratory distress. Has no wheezes.  Neurological: Is alert and oriented to person, place, and time.  Skin: Skin is warm. Capillary refill takes less than 2 seconds.  Psychiatric: Has a normal mood and affect. Behavior is normal. Judgment and thought content normal.    Health Maintenance Due  Topic Date Due  . HIV Screening  12/17/1970  . HEMOGLOBIN A1C  10/26/2018    There are no preventive care reminders to display for this patient.  Lab Results  Component Value Date   TSH 2.040 09/20/2016   Lab Results  Component Value  Date   WBC 5.3 07/14/2018   HGB 12.6 07/14/2018   HCT 38.9 07/14/2018   MCV 83.7 07/14/2018   PLT 328 07/14/2018   Lab Results  Component Value Date   NA 139 12/23/2018   K 4.1 12/23/2018   CO2 24 12/23/2018   GLUCOSE 97 12/23/2018   BUN 21 12/23/2018   CREATININE 1.21 (H) 12/23/2018   BILITOT 0.4 12/23/2018   ALKPHOS 64 12/23/2018   AST 19 12/23/2018   ALT 22 12/23/2018   PROT 7.2 12/23/2018   ALBUMIN 4.4 12/23/2018   CALCIUM 9.6 12/23/2018   ANIONGAP 8 07/14/2018   Lab Results  Component Value Date   CHOL 185 12/23/2018   Lab Results  Component Value Date   HDL 85 12/23/2018   Lab Results  Component Value Date   LDLCALC 74 12/23/2018   Lab Results  Component Value Date   TRIG 129 12/23/2018   Lab Results  Component Value Date   CHOLHDL 2.2 12/23/2018   Lab Results  Component Value Date   HGBA1C 6.2 (H) 12/23/2018      Assessment & Plan:   Problem List Items Addressed This Visit      Cardiovascular and Mediastinum   Essential hypertension  - Patient's blood pressure is at goal of 139/89 or less. Condition is stable. Continue current medications and treatment plan. I recommend that you exercise for 30-45 minutes 5 days a week. I also recommend a balanced diet with fruits and vegetables every day, lean meats, and little fried foods. The DASH diet (you can find this online) is a good example of this.    Relevant Medications   amLODipine (NORVASC) 10 MG tablet   lisinopril (ZESTRIL) 20 MG tablet   Other Relevant Orders   CMP14+EGFR (Completed)     Endocrine   Diet-controlled diabetes mellitus ([place]) - Primary Discussed her well controlled diabetes Continue lisinopril    Relevant Medications   lisinopril (ZESTRIL) 20 MG tablet   Other Relevant Orders   Hemoglobin A1c (Completed)   Lipid panel (Completed)    Other Visit Diagnoses    Class 1  obesity due to excess calories with serious comorbidity and body mass index (BMI) of 31.0 to 31.9 in adult     -  Discussed restricting starches, discussed cutting calories from fats and also to add in exercise as she has a good diet so far      Meds ordered this encounter  Medications  . amLODipine (NORVASC) 10 MG tablet    Sig: Take 1 tablet (10 mg total) by mouth daily.    Dispense:  90 tablet    Refill:  1  . lisinopril (ZESTRIL) 20 MG tablet    Sig: Take 1 tablet (20 mg total) by mouth daily.    Dispense:  90 tablet    Refill:  1    Follow-up: Return in about 6 months (around 06/25/2019) for physical exam.    Forrest Moron, MD

## 2018-12-24 LAB — CMP14+EGFR
ALT: 22 IU/L (ref 0–32)
AST: 19 IU/L (ref 0–40)
Albumin/Globulin Ratio: 1.6 (ref 1.2–2.2)
Albumin: 4.4 g/dL (ref 3.8–4.8)
Alkaline Phosphatase: 64 IU/L (ref 39–117)
BUN/Creatinine Ratio: 17 (ref 12–28)
BUN: 21 mg/dL (ref 8–27)
Bilirubin Total: 0.4 mg/dL (ref 0.0–1.2)
CO2: 24 mmol/L (ref 20–29)
Calcium: 9.6 mg/dL (ref 8.7–10.3)
Chloride: 101 mmol/L (ref 96–106)
Creatinine, Ser: 1.21 mg/dL — ABNORMAL HIGH (ref 0.57–1.00)
GFR calc Af Amer: 55 mL/min/{1.73_m2} — ABNORMAL LOW (ref >59)
GFR calc non Af Amer: 48 mL/min/{1.73_m2} — ABNORMAL LOW (ref >59)
Globulin, Total: 2.8 g/dL (ref 1.5–4.5)
Glucose: 97 mg/dL (ref 65–99)
Potassium: 4.1 mmol/L (ref 3.5–5.2)
Sodium: 139 mmol/L (ref 134–144)
Total Protein: 7.2 g/dL (ref 6.0–8.5)

## 2018-12-24 LAB — LIPID PANEL
Chol/HDL Ratio: 2.2 ratio (ref 0.0–4.4)
Cholesterol, Total: 185 mg/dL (ref 100–199)
HDL: 85 mg/dL (ref >39)
LDL Calculated: 74 mg/dL (ref 0–99)
Triglycerides: 129 mg/dL (ref 0–149)
VLDL Cholesterol Cal: 26 mg/dL (ref 5–40)

## 2018-12-24 LAB — HEMOGLOBIN A1C
Est. average glucose Bld gHb Est-mCnc: 131 mg/dL
Hgb A1c MFr Bld: 6.2 % — ABNORMAL HIGH (ref 4.8–5.6)

## 2018-12-29 ENCOUNTER — Ambulatory Visit: Payer: Self-pay | Admitting: *Deleted
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
## 2018-12-30 ENCOUNTER — Telehealth (INDEPENDENT_AMBULATORY_CARE_PROVIDER_SITE_OTHER): Admitting: Family Medicine

## 2018-12-30 ENCOUNTER — Other Ambulatory Visit: Payer: Self-pay

## 2018-12-30 DIAGNOSIS — R51 Headache: Secondary | ICD-10-CM

## 2018-12-30 DIAGNOSIS — M542 Cervicalgia: Secondary | ICD-10-CM

## 2018-12-30 DIAGNOSIS — R519 Headache, unspecified: Secondary | ICD-10-CM
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
## 2018-12-31 ENCOUNTER — Telehealth: Payer: Self-pay | Admitting: Family Medicine

## 2018-12-31 DIAGNOSIS — M542 Cervicalgia: Secondary | ICD-10-CM | POA: Insufficient documentation

## 2018-12-31 DIAGNOSIS — R519 Headache, unspecified: Secondary | ICD-10-CM | POA: Insufficient documentation

## 2018-12-31 NOTE — Telephone Encounter (Signed)
Copied from [place] [id number]. Topic: General - Inquiry >> Dec 31, 2018  2:19 PM Virl Axe D wrote: Reason for CRM: Pt called to request a printed copy of her recent lab results from 12/23/18. Needs to pick them up as soon as possible for school. Please contact pt so she can pick this up.

## 2019-01-01 ENCOUNTER — Emergency Department (HOSPITAL_COMMUNITY)
Admission: EM | Admit: 2019-01-01 | Discharge: 2019-01-01 | Disposition: A | Discharge status: Home / Self Care | Attending: Emergency Medicine | Admitting: Emergency Medicine

## 2019-01-01 ENCOUNTER — Emergency Department (HOSPITAL_COMMUNITY)

## 2019-01-01 ENCOUNTER — Other Ambulatory Visit: Payer: Self-pay

## 2019-01-01 DIAGNOSIS — Z87891 Personal history of nicotine dependence: Secondary | ICD-10-CM | POA: Diagnosis not present

## 2019-01-01 DIAGNOSIS — Z7982 Long term (current) use of aspirin: Secondary | ICD-10-CM | POA: Diagnosis not present

## 2019-01-01 DIAGNOSIS — Y999 Unspecified external cause status: Secondary | ICD-10-CM | POA: Diagnosis not present

## 2019-01-01 DIAGNOSIS — Z8679 Personal history of other diseases of the circulatory system: Secondary | ICD-10-CM | POA: Diagnosis not present

## 2019-01-01 DIAGNOSIS — Y92410 Unspecified street and highway as the place of occurrence of the external cause: Secondary | ICD-10-CM | POA: Diagnosis not present

## 2019-01-01 DIAGNOSIS — S0990XA Unspecified injury of head, initial encounter: Secondary | ICD-10-CM | POA: Diagnosis present

## 2019-01-01 DIAGNOSIS — I10 Essential (primary) hypertension: Secondary | ICD-10-CM | POA: Diagnosis not present

## 2019-01-01 DIAGNOSIS — M545 Low back pain, unspecified: Secondary | ICD-10-CM

## 2019-01-01 DIAGNOSIS — Y9389 Activity, other specified: Secondary | ICD-10-CM | POA: Diagnosis not present

## 2019-01-01 DIAGNOSIS — S060X0A Concussion without loss of consciousness, initial encounter: Secondary | ICD-10-CM | POA: Diagnosis not present

## 2019-01-01 DIAGNOSIS — Z79899 Other long term (current) drug therapy: Secondary | ICD-10-CM | POA: Insufficient documentation

## 2019-01-01 MED ORDER — SODIUM CHLORIDE 0.9 % IV BOLUS: 500.0000 mL | Freq: Once | INTRAVENOUS | Status: DC

## 2019-01-01 NOTE — ED Notes (Signed)
Pt transported to CT ?

## 2019-01-01 NOTE — Telephone Encounter (Signed)
Pt called in to follow up on request to have a copy of her results. Pt says that she also had some imaging completed that she would like to have a copy of also.   Pt says that she would like to pick up when ready. Please call her.
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
## 2019-01-01 NOTE — ED Triage Notes (Signed)
Pt. Stated, I have a headache and lower back pain since last Friday.

## 2019-01-01 NOTE — ED Notes (Signed)
Pt back from CT

## 2019-01-01 NOTE — ED Notes (Signed)
Pt. Given fluids to drink

## 2019-01-04 NOTE — Telephone Encounter (Signed)
Spoke with pt and she informed me that she need blood work for school. States she need titers done abd tb gold done. Advised her I would check with PCP and give her a call later.

## 2019-01-04 NOTE — Telephone Encounter (Signed)
Patient requesting call back from [facility] regarding results.

## 2019-01-05 ENCOUNTER — Other Ambulatory Visit: Payer: Self-pay

## 2019-01-05 DIAGNOSIS — Z1159 Encounter for screening for other viral diseases: Secondary | ICD-10-CM

## 2019-01-05 DIAGNOSIS — Z23 Encounter for immunization: Secondary | ICD-10-CM

## 2019-01-05 DIAGNOSIS — Z789 Other specified health status: Secondary | ICD-10-CM

## 2019-01-05 DIAGNOSIS — Z111 Encounter for screening for respiratory tuberculosis: Secondary | ICD-10-CM

## 2019-01-11 ENCOUNTER — Other Ambulatory Visit: Payer: Self-pay

## 2019-01-11 ENCOUNTER — Telehealth (INDEPENDENT_AMBULATORY_CARE_PROVIDER_SITE_OTHER): Admitting: Family Medicine

## 2019-01-11 DIAGNOSIS — R002 Palpitations: Secondary | ICD-10-CM | POA: Diagnosis not present

## 2019-01-11 DIAGNOSIS — F419 Anxiety disorder, unspecified: Secondary | ICD-10-CM | POA: Diagnosis not present

## 2019-01-11 DIAGNOSIS — F4310 Post-traumatic stress disorder, unspecified: Secondary | ICD-10-CM | POA: Diagnosis not present

## 2019-01-11 MED ORDER — CITALOPRAM HYDROBROMIDE 10 MG PO TABS: 10.0000 mg | ORAL_TABLET | Freq: Every day | ORAL | 3 refills | Status: DC

## 2019-01-11 MED ORDER — BUSPIRONE HCL 5 MG PO TABS: 5.0000 mg | ORAL_TABLET | Freq: Three times a day (TID) | ORAL | 0 refills | Status: DC
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
## 2019-01-12 ENCOUNTER — Telehealth: Payer: Self-pay | Admitting: Radiology

## 2019-01-12 NOTE — Telephone Encounter (Signed)
Enrolled patient for a 3 day Zio monitor to be mailed. Brief instructions were gone over and patient knows to expect the monitor to arrive in 3-4 days.  

## 2019-01-18 ENCOUNTER — Other Ambulatory Visit (INDEPENDENT_AMBULATORY_CARE_PROVIDER_SITE_OTHER)

## 2019-01-18 DIAGNOSIS — R002 Palpitations: Secondary | ICD-10-CM

## 2019-01-31 ENCOUNTER — Other Ambulatory Visit: Payer: Self-pay | Admitting: Family Medicine

## 2019-01-31 DIAGNOSIS — F4310 Post-traumatic stress disorder, unspecified: Secondary | ICD-10-CM

## 2019-02-01 ENCOUNTER — Other Ambulatory Visit: Payer: Self-pay | Admitting: Family Medicine

## 2019-02-09 ENCOUNTER — Other Ambulatory Visit: Payer: Self-pay | Admitting: Family Medicine

## 2019-02-09 DIAGNOSIS — F4310 Post-traumatic stress disorder, unspecified: Secondary | ICD-10-CM

## 2019-02-16 ENCOUNTER — Other Ambulatory Visit: Payer: Self-pay | Admitting: Family Medicine

## 2019-02-16 DIAGNOSIS — F4310 Post-traumatic stress disorder, unspecified: Secondary | ICD-10-CM

## 2019-02-23 ENCOUNTER — Other Ambulatory Visit: Payer: Self-pay | Admitting: Family Medicine

## 2019-02-23 DIAGNOSIS — F4310 Post-traumatic stress disorder, unspecified: Secondary | ICD-10-CM

## 2019-03-01 ENCOUNTER — Telehealth (INDEPENDENT_AMBULATORY_CARE_PROVIDER_SITE_OTHER): Admitting: Family Medicine

## 2019-03-02 ENCOUNTER — Telehealth (INDEPENDENT_AMBULATORY_CARE_PROVIDER_SITE_OTHER): Admitting: Family Medicine

## 2019-03-02 ENCOUNTER — Encounter: Payer: Self-pay | Admitting: Family Medicine

## 2019-03-02 DIAGNOSIS — F4310 Post-traumatic stress disorder, unspecified: Secondary | ICD-10-CM

## 2019-03-02 DIAGNOSIS — F419 Anxiety disorder, unspecified: Secondary | ICD-10-CM | POA: Diagnosis not present

## 2019-03-02 DIAGNOSIS — F322 Major depressive disorder, single episode, severe without psychotic features: Secondary | ICD-10-CM

## 2019-03-02 MED ORDER — METOPROLOL TARTRATE 25 MG PO TABS: 12.5000 mg | ORAL_TABLET | Freq: Every day | ORAL | 3 refills | Status: DC | PRN

## 2019-03-02 MED ORDER — BUPROPION HCL ER (SR) 150 MG PO TB12: 150.0000 mg | ORAL_TABLET | Freq: Every day | ORAL | 3 refills | Status: DC

## 2019-03-02 MED ORDER — MIRTAZAPINE 7.5 MG PO TABS: 7.5000 mg | ORAL_TABLET | Freq: Every day | ORAL | 0 refills | Status: DC

## 2019-03-02 MED ORDER — CITALOPRAM HYDROBROMIDE 20 MG PO TABS: 20.0000 mg | ORAL_TABLET | Freq: Every day | ORAL | 0 refills | Status: DC
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
## 2019-03-02 NOTE — Progress Notes (Signed)
LVM to schedule appt

## 2019-03-02 NOTE — Patient Instructions (Signed)
° ° ° °  If you have lab work done today you will be contacted with your lab results within the next 2 weeks.  If you have not heard from us then please contact us. The fastest way to get your results is to register for My Chart. ° ° °IF you received an x-ray today, you will receive an invoice from [facility]. Please contact [facility] at [phone] with questions or concerns regarding your invoice.  ° °IF you received labwork today, you will receive an invoice from [facility]. Please contact [facility] at [phone] with questions or concerns regarding your invoice.  ° °Our billing staff will not be able to assist you with questions regarding bills from these companies. ° °You will be contacted with the lab results as soon as they are available. The fastest way to get your results is to activate your My Chart account. Instructions are located on the last page of this paperwork. If you have not heard from us regarding the results in 2 weeks, please contact this office. °  ° ° ° °

## 2019-03-02 NOTE — Progress Notes (Signed)
Telemedicine Encounter- SOAP NOTE Established Patient  This telephone encounter was conducted with the patient's (or proxy's) verbal consent via audio telecommunications: yes/no: Yes Patient was instructed to have this encounter in a suitably private space; and to only have persons present to whom they give permission to participate. In addition, patient identity was confirmed by use of name plus two identifiers (DOB and address).  I discussed the limitations, risks, security and privacy concerns of performing an evaluation and management service by telephone and the availability of in person appointments. I also discussed with the patient that there may be a patient responsible charge related to this service. The patient expressed understanding and agreed to proceed.  I spent a total of TIME; 0 MIN TO 60 MIN: 25 minutes talking with the patient or their proxy.  Chief Complaint  Patient presents with  . heart monitor results and review meds  . Depression    score 21  . Back Pain    onset for a while , pain level 10/10 and wants medication for pain    Subjective   Emily Lopez is a 63 y.o. established patient. Telephone visit today for  HPI  PTSD and insomnia Pt reports having bizarre dreams Nightmares that are so real she cannot tell that she is not in reality She states that her  Dreams are frightening She denies any suicidal thoughts She previously tried counseling Now she is afraid of trees, storms and being outside  Depression screen [facility] 2/9 03/02/2019 03/02/2019 01/11/2019  Decreased Interest 0 - 0  Down, Depressed, Hopeless 3 3 0  PHQ - 2 Score 3 3 0  Altered sleeping 3 - -  Tired, decreased energy 3 - -  Change in appetite 3 - -  Feeling bad or failure about yourself  3 - -  Trouble concentrating 3 - -  Moving slowly or fidgety/restless 3 - -  Suicidal thoughts 0 - -  PHQ-9 Score 21 - -     She feels like she is in a dark tunnel and can't get out of it No  suicidal thoughts She just feels scared  Heart Palpitations Pt reports that she still feels like her palpitations are present Reviewed the holter monitor from 01/29/2019 showed some supraventricular tachycardia for at most 18 beats with average HR 142 Reviewed the holter monitor and labs with patient. BP Readings from Last 3 Encounters:  01/01/19 139/85  12/23/18 128/70  07/27/18 140/82   She has never taken a beta blocker before She denies dizziness   Patient Active Problem List   Diagnosis Date Noted  . Acute intractable headache 12/31/2018  . Neck pain 12/31/2018  . 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 Diet-controlled diabetes mellitus ([place]) 05/22/2017  . Class 1 obesity due to excess calories with serious comorbidity and body mass index (BMI) of 30.0 to 30.9 in adult 05/22/2017  . Right leg swelling 10/21/2016  . VITAMIN D DEFICIENCY 03/13/2009  . DEPRESSIVE DISORDER 08/24/2008  . OVERWEIGHT 07/25/2008  . ATTENTION DEFICIT DISORDER, ADULT 07/25/2008  . SYSTOLIC MURMUR 123456  . GERD 07/20/2007  . ANEMIA, IRON DEFICIENCY, UNSPEC. 08/07/2006  . Essential hypertension 08/07/2006    Past Medical History:  Diagnosis Date  . Anxiety   . Arthritis   . Attention deficit disorder without mention of hyperactivity   . Depression   . Esophageal reflux   . Hypertension   . Iron deficiency anemia, unspecified   . Myalgia and myositis, unspecified   . Pneumonia  2008 or 2009  . Pre-diabetes   . Undiagnosed cardiac murmurs   . Unspecified vitamin D deficiency     Current Outpatient Medications  Medication Sig Dispense Refill  . amLODipine (NORVASC) 10 MG tablet Take 1 tablet (10 mg total) by mouth daily. 90 tablet 1  . aspirin 81 MG EC tablet Take 1 tablet (81 mg total) by mouth daily. Swallow whole. 90 tablet 3  . busPIRone (BUSPAR) 5 MG tablet TAKE 1 TABLET(5 MG) BY MOUTH THREE TIMES DAILY 30 tablet 0  . lisinopril (ZESTRIL) 20 MG tablet TAKE 1 TABLET BY MOUTH ONCE  DAILY 90 tablet 1  . Multiple Vitamin (MULTIVITAMIN) LIQD Take 5 mLs by mouth daily. I[place]  Nutri burst Liquid vitamin    . naproxen (NAPROSYN) 500 MG tablet Take 1 tablet (500 mg total) by mouth 2 (two) times daily. 30 tablet 0  . buPROPion (WELLBUTRIN SR) 150 MG 12 hr tablet Take 1 tablet (150 mg total) by mouth daily. 30 tablet 3  . GARLIC PO Take 1 tablet by mouth.    . metoprolol tartrate (LOPRESSOR) 25 MG tablet Take 0.5 tablets (12.5 mg total) by mouth daily as needed. 30 tablet 3  . mirtazapine (REMERON) 7.5 MG tablet Take 1 tablet (7.5 mg total) by mouth at bedtime. 30 tablet 0  . Prenatal Vit-Fe Fumarate-FA (PRENATAL MULTIVITAMIN) TABS tablet Take 1 tablet by mouth daily at 12 noon.     No current facility-administered medications for this visit.     Allergies  Allergen Reactions  . No Known Allergies     Social History   Socioeconomic History  . Marital status: Married    Spouse name: Not on file  . Number of children: Not on file  . Years of education: Not on file  . Highest education level: Not on file  Occupational History  . Not on file  Social Needs  . Financial resource strain: Not on file  . Food insecurity    Worry: Not on file    Inability: Not on file  . Transportation needs    Medical: Not on file    Non-medical: Not on file  Tobacco Use  . Smoking status: Never Smoker  . Smokeless tobacco: Former Systems developer    Types: Snuff  Substance and Sexual Activity  . Alcohol use: Yes    Alcohol/week: 0.0 standard drinks    Comment: 2 drinks  . Drug use: Never  . Sexual activity: Not Currently    Birth control/protection: None, Post-menopausal  Lifestyle  . Physical activity    Days per week: Not on file    Minutes per session: Not on file  . Stress: Not on file  Relationships  . Social Herbalist on phone: Not on file    Gets together: Not on file    Attends religious service: Not on file    Active member of club or organization: Not on file     Attends meetings of clubs or organizations: Not on file    Relationship status: Not on file  . Intimate partner violence    Fear of current or ex partner: Not on file    Emotionally abused: Not on file    Physically abused: Not on file    Forced sexual activity: Not on file  Other Topics Concern  . Not on file  Social History Narrative  . Not on file    ROS Review of Systems  Constitutional: Negative for activity change, appetite change,  chills and fever.  HENT: Negative for congestion, nosebleeds, trouble swallowing and voice change.   Respiratory: Negative for cough, shortness of breath and wheezing.   Gastrointestinal: Negative for diarrhea, nausea and vomiting.  Genitourinary: Negative for difficulty urinating, dysuria, flank pain and hematuria.  Musculoskeletal: Negative for back pain, joint swelling and neck pain.  Neurological: Negative for dizziness, speech difficulty, light-headedness and numbness.  See HPI. All other review of systems negative.   Objective   Vitals as reported by the patient: There were no vitals filed for this visit.  Phone visit  Ellena was seen today for heart monitor results and review meds, depression and back pain.  Diagnoses and all orders for this visit:  Severe anxiety -     Ambulatory referral to [facility]: citalopram (CELEXA) 20 MG tablet; Take 1 tablet (20 mg total) by mouth daily.  PTSD (post-traumatic stress disorder) -     Ambulatory referral to [place]: citalopram (CELEXA) 20 MG tablet; Take 1 tablet (20 mg total) by mouth daily. -     mirtazapine (REMERON) 7.5 MG tablet; Take 1 tablet (7.5 mg total) by mouth at bedtime.  Severe depression ([place]) -     Ambulatory referral to [facility]  Other orders -     buPROPion (WELLBUTRIN SR) 150 MG 12 hr tablet; Take 1 tablet (150 mg total) by mouth daily. -     metoprolol tartrate (LOPRESSOR) 25 MG tablet; Take 0.5 tablets  (12.5 mg total) by mouth daily as needed.    Plan for patient and follow up plan: 1. Stop the Celexa (citalopram) 2. Start the mirtazapine at bedtime for anxiety and nightmares 3. Start wellbutrin in the morning for depression and anxiety 4. Take metoprolol 12.5mg  (half tablet) if you feel like you heart is racing.  Metoprolol will bring down your pulse. It can also make you sleepy. 5. Follow up with Behavioral Health  6. Follow up with Dr. Nolon Rod in 4 weeks I discussed the assessment and treatment plan with the patient. The patient was provided an opportunity to ask questions and all were answered. The patient agreed with the plan and demonstrated an understanding of the instructions.   The patient was advised to call back or seek an in-person evaluation if the symptoms worsen or if the condition fails to improve as anticipated.  I provided 25 minutes of non-face-to-face time during this encounter.  Forrest Moron, MD  Primary Care at [facility]

## 2019-03-07 NOTE — Progress Notes (Signed)
Patient did not answer the phone.

## 2019-03-29 ENCOUNTER — Other Ambulatory Visit: Payer: Self-pay

## 2019-03-29 ENCOUNTER — Telehealth (INDEPENDENT_AMBULATORY_CARE_PROVIDER_SITE_OTHER): Admitting: Family Medicine

## 2019-03-29 DIAGNOSIS — F4310 Post-traumatic stress disorder, unspecified: Secondary | ICD-10-CM | POA: Diagnosis not present

## 2019-03-29 DIAGNOSIS — F322 Major depressive disorder, single episode, severe without psychotic features: Secondary | ICD-10-CM
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
## 2019-04-05 ENCOUNTER — Encounter: Payer: Self-pay | Admitting: Family Medicine

## 2019-04-08 ENCOUNTER — Other Ambulatory Visit: Payer: Self-pay

## 2019-04-08 DIAGNOSIS — Z20822 Contact with and (suspected) exposure to covid-19: Secondary | ICD-10-CM

## 2019-04-09 LAB — HM MAMMOGRAPHY

## 2019-04-09 LAB — NOVEL CORONAVIRUS, NAA: SARS-CoV-2, NAA: NOT DETECTED

## 2019-04-17 ENCOUNTER — Other Ambulatory Visit: Payer: Self-pay

## 2019-04-17 ENCOUNTER — Ambulatory Visit (INDEPENDENT_AMBULATORY_CARE_PROVIDER_SITE_OTHER)

## 2019-04-17 ENCOUNTER — Ambulatory Visit (HOSPITAL_COMMUNITY)
Admission: EM | Admit: 2019-04-17 | Discharge: 2019-04-17 | Disposition: A | Discharge status: Home / Self Care | Attending: Family Medicine | Admitting: Family Medicine

## 2019-04-17 ENCOUNTER — Encounter (HOSPITAL_COMMUNITY): Payer: Self-pay

## 2019-04-17 DIAGNOSIS — M25562 Pain in left knee: Secondary | ICD-10-CM | POA: Diagnosis not present

## 2019-04-17 DIAGNOSIS — S8002XA Contusion of left knee, initial encounter: Secondary | ICD-10-CM | POA: Diagnosis not present

## 2019-04-17 MED ORDER — IBUPROFEN 800 MG PO TABS
ORAL_TABLET | ORAL | Status: AC
  Filled 2019-04-17: qty 1

## 2019-04-17 MED ORDER — IBUPROFEN 800 MG PO TABS
800.0000 mg | ORAL_TABLET | Freq: Once | ORAL | Status: AC
  Administered 2019-04-17: 800 mg via ORAL

## 2019-04-17 NOTE — ED Triage Notes (Addendum)
Pt states she was getting out of her house thru the back door and she tripped and fell on the ground. Pt states having left knee pain. Pt denies any other symptoms.

## 2019-04-19 NOTE — ED Provider Notes (Signed)
[place]   PD:[id number] 04/17/19 Arrival Time: [id number]  ASSESSMENT & PLAN:  1. Acute pain of left knee   2. Contusion of left knee, initial encounter     I have personally viewed the imaging studies ordered this visit. No fractures identified.   Meds ordered this encounter  Medications  . ibuprofen (ADVIL) tablet 800 mg   Prefers OTC analgesics. WBAT.   Recommend: Follow-up Information    [employer].   Why: If worsening or failing to improve as anticipated. Contact information: [address] [place] [id number]          Reviewed expectations re: course of current medical issues. Questions answered. Outlined signs and symptoms indicating need for more acute intervention. Patient verbalized understanding. After Visit Summary given.  SUBJECTIVE: History from: patient. Emily Lopez is a 63 y.o. female who reports fairly persistent mild to moderate pain of her left knee; described as aching; without radiation. Onset: abrupt. First noted: yesterday. Injury/trama: reports falling onto L knee at home; immediate discomfort; able to bear weight. Symptoms have progressed to a point and plateaued since beginning. Aggravating factors: certain movements and weight bearing. Alleviating factors: rest. Associated symptoms: none reported. Extremity sensation changes or weakness: none. Self treatment: has not tried OTC therapies.  History of similar: no.  Past Surgical History:  Procedure Laterality Date  . DILATATION & CURETTAGE/HYSTEROSCOPY WITH MYOSURE N/A 07/14/2018   Procedure: DILATATION & CURETTAGE/HYSTEROSCOPY with ultrasound guidance;  Surgeon: Nunzio Cobbs, MD;  [facility];  Service: Gynecology;  Laterality: N/A;  ultrasound guidance needed. Follow previous case.  . NO PAST SURGERIES    . OPERATIVE ULTRASOUND N/A 07/14/2018   Procedure: OPERATIVE  ULTRASOUND ultrasound guided hysteroscopy;  Surgeon: Nunzio Cobbs, MD;  Location[facility];  Service: Gynecology;  Laterality: N/A;  ultrasound guided hysteroscopy/D&C due to cervical stenosis     ROS: As per HPI. All other systems negative.    OBJECTIVE:  Vitals:   04/17/19 1515  BP: (!) 150/89  Pulse: 78  Resp: 16  Temp: 98.6 F (37 C)  TempSrc: Oral  SpO2: 100%    General appearance: alert; no distress HEENT: [place]; AT Neck: supple with FROM Resp: unlabored respirations Extremities: . LLE: warm with well perfused appearance; poorly localized mild to moderate tenderness over left anterior knee; without gross deformities; swelling: none; bruising: none; knee ROM: normal with reported discomfort CV: brisk extremity capillary refill of LLE; 2+ DP/PT pulses of LLE. Skin: warm and dry; no visible rashes Neurologic: gait normal; normal reflexes of LLE; normal sensation of LLE; normal strength of LLE Psychological: alert and cooperative; normal mood and affect   Allergies  Allergen Reactions  . No Known Allergies     Past Medical History:  Diagnosis Date  . Anxiety   . Arthritis   . Attention deficit disorder without mention of hyperactivity   . Depression   . Esophageal reflux   . Hypertension   . Iron deficiency anemia, unspecified   . Myalgia and myositis, unspecified   . Pneumonia    2008 or 2009  . Pre-diabetes   . Undiagnosed cardiac murmurs   . Unspecified vitamin D deficiency    Social History   Socioeconomic History  . Marital status: Married    Spouse name: Not on file  . Number of children: Not on file  . Years of education: Not on file  .  Highest education level: Not on file  Occupational History  . Not on file  Social Needs  . Financial resource strain: Not on file  . Food insecurity    Worry: Not on file    Inability: Not on file  . Transportation needs    Medical: Not on file    Non-medical: Not on file   Tobacco Use  . Smoking status: Never Smoker  . Smokeless tobacco: Former Systems developer    Types: Snuff  Substance and Sexual Activity  . Alcohol use: Yes    Alcohol/week: 0.0 standard drinks    Comment: 2 drinks  . Drug use: Never  . Sexual activity: Not Currently    Birth control/protection: None, Post-menopausal  Lifestyle  . Physical activity    Days per week: Not on file    Minutes per session: Not on file  . Stress: Not on file  Relationships  . Social Herbalist on phone: Not on file    Gets together: Not on file    Attends religious service: Not on file    Active member of club or organization: Not on file    Attends meetings of clubs or organizations: Not on file    Relationship status: Not on file  Other Topics Concern  . Not on file  Social History Narrative  . Not on file   Family History  Problem Relation Age of Onset  . Hypertension Mother   . Diabetes Mother   . Arthritis Mother   . Hyperlipidemia Mother   . Lung cancer Father   . Heart attack Father   . Diabetes Brother   . Hypertension Sister   . Breast cancer Sister   . Hernia Sister    Past Surgical History:  Procedure Laterality Date  . DILATATION & CURETTAGE/HYSTEROSCOPY WITH MYOSURE N/A 07/14/2018   Procedure: DILATATION & CURETTAGE/HYSTEROSCOPY with ultrasound guidance;  Surgeon: Nunzio Cobbs, MD;  Location: [facility];  Service: Gynecology;  Laterality: N/A;  ultrasound guidance needed. Follow previous case.  . NO PAST SURGERIES    . OPERATIVE ULTRASOUND N/A 07/14/2018   Procedure: OPERATIVE ULTRASOUND ultrasound guided hysteroscopy;  Surgeon: Nunzio Cobbs, MD;  Location: [facility];  Service: Gynecology;  Laterality: N/A;  ultrasound guided hysteroscopy/D&C due to cervical stenosis      Vanessa Kick, MD 04/19/19 [phone]

## 2019-05-28 ENCOUNTER — Other Ambulatory Visit: Payer: Self-pay | Admitting: Family Medicine

## 2019-05-28 DIAGNOSIS — F4310 Post-traumatic stress disorder, unspecified: Secondary | ICD-10-CM

## 2019-05-28 DIAGNOSIS — F419 Anxiety disorder, unspecified: Secondary | ICD-10-CM

## 2019-05-28 NOTE — Telephone Encounter (Signed)
Forwarding medication refill requests to the clinical pool for review. 

## 2019-06-28 ENCOUNTER — Encounter: Admitting: Family Medicine

## 2019-07-03 ENCOUNTER — Other Ambulatory Visit: Payer: Self-pay | Admitting: Family Medicine

## 2019-07-03 NOTE — Telephone Encounter (Signed)
Requested Prescriptions  Pending Prescriptions Disp Refills  . buPROPion (WELLBUTRIN SR) 150 MG 12 hr tablet [Pharmacy Med Name: BUPROPION SR 150MG  TABLETS (12 H)] 30 tablet 3    Sig: TAKE 1 TABLET(150 MG) BY MOUTH DAILY     Psychiatry: Antidepressants - bupropion Failed - 07/03/2019  5:12 PM      Failed - Last BP in normal range    BP Readings from Last 1 Encounters:  04/17/19 (!) 150/89         Passed - Completed PHQ-2 or PHQ-9 in the last 360 days.      Passed - Valid encounter within last 6 months    Recent Outpatient Visits          3 months ago PTSD (post-traumatic stress disorder)   Primary Care at [facility], Arlie Solomons, MD   4 months ago Severe anxiety   Primary Care at [facility], Arlie Solomons, MD   4 months ago    Primary Care at [facility], Arlie Solomons, MD   5 months ago Palpitations   Primary Care at [facility] [place] A, MD   6 months ago Acute intractable headache, unspecified headache type   Primary Care at [place], MD      Future Appointments            In 2 days Forrest Moron, MD Primary Care at [place]

## 2019-07-05 ENCOUNTER — Ambulatory Visit (INDEPENDENT_AMBULATORY_CARE_PROVIDER_SITE_OTHER): Admitting: Family Medicine

## 2019-07-05 ENCOUNTER — Other Ambulatory Visit: Payer: Self-pay

## 2019-07-05 ENCOUNTER — Encounter: Payer: Self-pay | Admitting: Family Medicine

## 2019-07-05 VITALS — BP 148/73 | HR 83 | Temp 97.8°F | Resp 17 | Ht 67.0 in | Wt 197.4 lb

## 2019-07-05 DIAGNOSIS — Z114 Encounter for screening for human immunodeficiency virus [HIV]: Secondary | ICD-10-CM | POA: Diagnosis not present

## 2019-07-05 DIAGNOSIS — Z0001 Encounter for general adult medical examination with abnormal findings: Secondary | ICD-10-CM

## 2019-07-05 DIAGNOSIS — E1169 Type 2 diabetes mellitus with other specified complication: Secondary | ICD-10-CM

## 2019-07-05 DIAGNOSIS — Z0000 Encounter for general adult medical examination without abnormal findings: Secondary | ICD-10-CM

## 2019-07-05 DIAGNOSIS — I10 Essential (primary) hypertension: Secondary | ICD-10-CM | POA: Diagnosis not present

## 2019-07-05 DIAGNOSIS — L659 Nonscarring hair loss, unspecified: Secondary | ICD-10-CM

## 2019-07-05 LAB — POCT GLYCOSYLATED HEMOGLOBIN (HGB A1C): Hemoglobin A1C: 6.8 % — AB (ref 4.0–5.6)

## 2019-07-05 LAB — POCT URINALYSIS DIP (MANUAL ENTRY)
Bilirubin, UA: NEGATIVE
Blood, UA: NEGATIVE
Glucose, UA: NEGATIVE mg/dL
Ketones, POC UA: NEGATIVE mg/dL
Leukocytes, UA: NEGATIVE
Nitrite, UA: NEGATIVE
Protein Ur, POC: NEGATIVE mg/dL
Spec Grav, UA: 1.015 (ref 1.010–1.025)
Urobilinogen, UA: 0.2 E.U./dL
pH, UA: 5.5 (ref 5.0–8.0)

## 2019-07-05 LAB — GLUCOSE, POCT (MANUAL RESULT ENTRY): POC Glucose: 98 mg/dl (ref 70–99)

## 2019-07-05 MED ORDER — TRIAMCINOLONE ACETONIDE 0.1 % EX OINT: 1.0000 "application " | TOPICAL_OINTMENT | Freq: Two times a day (BID) | CUTANEOUS | 0 refills | Status: DC

## 2019-07-05 NOTE — Progress Notes (Signed)
Chief Complaint  Patient presents with  . Annual Exam    napoxsyn and mirtazapine  . Medication Refill    Subjective:  Emily Lopez is a 64 y.o. female here for a health maintenance visit.  Patient is established pt  Patient Active Problem List   Diagnosis Date Noted  . Acute intractable headache 12/31/2018  . Neck pain 12/31/2018  . 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 Diet-controlled diabetes mellitus ([place]) 05/22/2017  . Class 1 obesity due to excess calories with serious comorbidity and body mass index (BMI) of 30.0 to 30.9 in adult 05/22/2017  . Right leg swelling 10/21/2016  . VITAMIN D DEFICIENCY 03/13/2009  . DEPRESSIVE DISORDER 08/24/2008  . OVERWEIGHT 07/25/2008  . ATTENTION DEFICIT DISORDER, ADULT 07/25/2008  . SYSTOLIC MURMUR 123456  . GERD 07/20/2007  . ANEMIA, IRON DEFICIENCY, UNSPEC. 08/07/2006  . Essential hypertension 08/07/2006    Past Medical History:  Diagnosis Date  . Anxiety   . Arthritis   . Attention deficit disorder without mention of hyperactivity   . Depression   . Esophageal reflux   . Hypertension   . Iron deficiency anemia, unspecified   . Myalgia and myositis, unspecified   . Pneumonia    2008 or 2009  . Pre-diabetes   . Undiagnosed cardiac murmurs   . Unspecified vitamin D deficiency     Past Surgical History:  Procedure Laterality Date  . DILATATION & CURETTAGE/HYSTEROSCOPY WITH MYOSURE N/A 07/14/2018   Procedure: DILATATION & CURETTAGE/HYSTEROSCOPY with ultrasound guidance;  Surgeon: Nunzio Cobbs, MD;  Location: [facility];  Service: Gynecology;  Laterality: N/A;  ultrasound guidance needed. Follow previous case.  . NO PAST SURGERIES    . OPERATIVE ULTRASOUND N/A 07/14/2018   Procedure: OPERATIVE ULTRASOUND ultrasound guided hysteroscopy;  Surgeon: Nunzio Cobbs, MD;  Location: [facility];  Service: Gynecology;  Laterality: N/A;  ultrasound guided hysteroscopy/D&C due to  cervical stenosis     Outpatient Medications Prior to Visit  Medication Sig Dispense Refill  . amLODipine (NORVASC) 10 MG tablet Take 1 tablet (10 mg total) by mouth daily. 90 tablet 1  . aspirin 81 MG EC tablet Take 1 tablet (81 mg total) by mouth daily. Swallow whole. 90 tablet 3  . buPROPion (WELLBUTRIN SR) 150 MG 12 hr tablet TAKE 1 TABLET(150 MG) BY MOUTH DAILY 30 tablet 3  . citalopram (CELEXA) 10 MG tablet Take 10 mg by mouth daily.    Marland Kitchen GARLIC PO Take 1 tablet by mouth.    Marland Kitchen lisinopril (ZESTRIL) 20 MG tablet TAKE 1 TABLET BY MOUTH ONCE DAILY 90 tablet 1  . metoprolol tartrate (LOPRESSOR) 25 MG tablet Take 0.5 tablets (12.5 mg total) by mouth daily as needed. 30 tablet 3  . mirtazapine (REMERON) 7.5 MG tablet Take 1 tablet (7.5 mg total) by mouth at bedtime. 30 tablet 0  . Multiple Vitamin (MULTIVITAMIN) LIQD Take 5 mLs by mouth daily. [place]o  Nutri burst Liquid vitamin    . naproxen (NAPROSYN) 500 MG tablet Take 1 tablet (500 mg total) by mouth 2 (two) times daily. 30 tablet 0  . Prenatal Vit-Fe Fumarate-FA (PRENATAL MULTIVITAMIN) TABS tablet Take 1 tablet by mouth daily at 12 noon.    . busPIRone (BUSPAR) 5 MG tablet TAKE 1 TABLET(5 MG) BY MOUTH THREE TIMES DAILY 30 tablet 0   No facility-administered medications prior to visit.    Allergies  Allergen Reactions  . No Known Allergies  Family History  Problem Relation Age of Onset  . Hypertension Mother   . Diabetes Mother   . Arthritis Mother   . Hyperlipidemia Mother   . Lung cancer Father   . Heart attack Father   . Diabetes Brother   . Hypertension Sister   . Breast cancer Sister   . Hernia Sister     Social History   Socioeconomic History  . Marital status: Married    Spouse name: Not on file  . Number of children: Not on file  . Years of education: Not on file  . Highest education level: Not on file  Occupational History  . Not on file  Tobacco Use  . Smoking status: Never Smoker  . Smokeless  tobacco: Former Systems developer    Types: Snuff  Substance and Sexual Activity  . Alcohol use: Yes    Alcohol/week: 0.0 standard drinks    Comment: 2 drinks  . Drug use: Never  . Sexual activity: Not Currently    Birth control/protection: None, Post-menopausal  Other Topics Concern  . Not on file  Social History Narrative  . Not on file   Social Determinants of Health   Financial Resource Strain:   . Difficulty of Paying Living Expenses: Not on file  Food Insecurity:   . Worried About Charity fundraiser in the Last Year: Not on file  . Ran Out of Food in the Last Year: Not on file  Transportation Needs:   . Lack of Transportation (Medical): Not on file  . Lack of Transportation (Non-Medical): Not on file  Physical Activity:   . Days of Exercise per Week: Not on file  . Minutes of Exercise per Session: Not on file  Stress:   . Feeling of Stress : Not on file  Social Connections:   . Frequency of Communication with Friends and Family: Not on file  . Frequency of Social Gatherings with Friends and Family: Not on file  . Attends Religious Services: Not on file  . Active Member of Clubs or Organizations: Not on file  . Attends Archivist Meetings: Not on file  . Marital Status: Not on file  Intimate Partner Violence:   . Fear of Current or Ex-Partner: Not on file  . Emotionally Abused: Not on file  . Physically Abused: Not on file  . Sexually Abused: Not on file   Social History   Substance and Sexual Activity  Alcohol Use Yes  . Alcohol/week: 0.0 standard drinks   Comment: 2 drinks   Social History   Tobacco Use  Smoking Status Never Smoker  Smokeless Tobacco Former Systems developer  . Types: Snuff   Social History   Substance and Sexual Activity  Drug Use Never    GYN: Sexual Health Menstrual status: regular menses LMP: Patient's last menstrual period was 06/11/2011 (within years). Last pap smear: see HM section History of abnormal pap smears:  Sexually active:  with female partner Current contraception:   Health Maintenance: See under health Maintenance activity for review of completion dates as well. Immunization History  Administered Date(s) Administered  . Hepatitis A, Adult 12/24/2013  . Hepatitis B 01/25/2014  . Hepatitis B, adult 12/24/2013  . Influenza Split 01/25/2014  . Influenza,inj,Quad PF,6+ Mos 05/22/2017, 04/27/2018, 03/28/2019  . PPD Test 07/01/2015  . Pneumococcal Conjugate-13 05/22/2017  . Pneumococcal Polysaccharide-23 04/27/2018  . Tdap 12/24/2013  . Zoster Recombinat (Shingrix) 04/07/2019      Depression Screen-PHQ2/9 Depression screen [facility] 2/9 07/05/2019 03/29/2019 03/02/2019 03/02/2019  01/11/2019  Decreased Interest 0 0 0 - 0  Down, Depressed, Hopeless 0 3 3 3  0  PHQ - 2 Score 0 3 3 3  0  Altered sleeping - 3 3 - -  Tired, decreased energy - 3 3 - -  Change in appetite - 3 3 - -  Feeling bad or failure about yourself  - 3 3 - -  Trouble concentrating - 3 3 - -  Moving slowly or fidgety/restless - 0 3 - -  Suicidal thoughts - 0 0 - -  PHQ-9 Score - 18 21 - -  Difficult doing work/chores - Not difficult at all - - -       Depression Severity and Treatment Recommendations:  0-4= None  5-9= Mild / Treatment: Support, educate to call if worse; return in one month  10-14= Moderate / Treatment: Support, watchful waiting; Antidepressant or Psycotherapy  15-19= Moderately severe / Treatment: Antidepressant OR Psychotherapy  >= 20 = Major depression, severe / Antidepressant AND Psychotherapy    Review of Systems   ROS  See HPI for ROS as well.   Review of Systems  Constitutional: Negative for activity change, appetite change, chills and fever.  HENT: Negative for congestion, nosebleeds, trouble swallowing and voice change.   Respiratory: Negative for cough, shortness of breath and wheezing.   Gastrointestinal: Negative for diarrhea, nausea and vomiting.  Genitourinary: Negative for difficulty urinating, dysuria,  flank pain and hematuria.  Musculoskeletal: Negative for back pain, joint swelling and neck pain.  Neurological: Negative for dizziness, speech difficulty, light-headedness and numbness.  See HPI. All other review of systems negative.   Objective:   Vitals:   07/05/19 0810  BP: (!) 148/73  Pulse: 83  Resp: 17  Temp: 97.8 F (36.6 C)  TempSrc: Oral  SpO2: 99%  Weight: 197 lb 6.4 oz (89.5 kg)  Height: 5\' 7"  (1.702 m)    Body mass index is 30.92 kg/m.  Physical Exam  Physical Exam  Constitutional: Oriented to person, place, and time. Appears well-developed and well-nourished.  HENT:  Head: Normocephalic and atraumatic.  Eyes: Conjunctivae and EOM are normal.  Neck: supple, no thyromegaly Cardiovascular: Normal rate, regular rhythm, normal heart sounds and intact distal pulses.  No murmur heard. Pulmonary/Chest: Effort normal and breath sounds normal. No stridor. No respiratory distress. Has no wheezes.  Abdomen: nondistended, normoactive bs, soft, nontender Neurological: Is alert and oriented to person, place, and time.  Skin: Skin is warm. Capillary refill takes less than 2 seconds. Receding hairline at temples  Psychiatric: Has a normal mood and affect. Behavior is normal. Judgment and thought content normal.      Assessment/Plan:   Patient was seen for a health maintenance exam.  Counseled the patient on health maintenance issues. Reviewed her health mainteance schedule and ordered appropriate tests (see orders.) Counseled on regular exercise and weight management. Recommend regular eye exams and dental cleaning.   The following issues were addressed today for health maintenance:   Jemmie was seen today for annual exam and medication refill.  Diagnoses and all orders for this visit:  Encounter for health maintenance examination in adult- Women's Health Maintenance Plan Advised monthly breast exam and annual mammogram Advised dental exam every six  months Discussed stress management Discussed pap smear screening guidelines   -     POCT urinalysis dipstick  Type 2 diabetes mellitus with other specified complication, unspecified whether long term insulin use ([place])- diabetes is stable Lab Results  Component Value Date  HGBA1C 6.8 (A) 07/05/2019    -     HM Diabetes Foot Exam -     Ambulatory referral to Ophthalmology -     Comprehensive metabolic panel -     POCT glycosylated hemoglobin (Hb A1C) -     Lipid panel -     POCT glucose (manual entry)  Essential hypertension- Patient's blood pressure is at not at goal of 139/89 or less. Condition is stable. Continue current medications and treatment plan. I recommend that you exercise for 30-45 minutes 5 days a week. I also recommend a balanced diet with fruits and vegetables every day, lean meats, and little fried foods. The DASH diet (you can find this online) is a good example of this.  -     Comprehensive metabolic panel -     Lipid panel  Screening for HIV (human immunodeficiency virus) -     HIV antibody (with reflex)  Hair loss- will assess for deficiencies Will try topical steroid on scalp -     VITAMIN D 25 Hydroxy (Vit-D Deficiency, Fractures) -     TSH -     Ambulatory referral to Dermatology  Other orders -     triamcinolone ointment (KENALOG) 0.1 %; Apply 1 application topically 2 (two) times daily. -     Comprehensive metabolic panel -     HIV Antibody (routine testing w rflx)    No follow-ups on file.    Body mass index is 30.92 kg/m.:  Discussed the patient's BMI with patient. The BMI body mass index is 30.92 kg/m.     No future appointments.  Patient Instructions  COVID-19 Vaccine Information can be found at: [email or web address] For questions related to vaccine distribution or appointments, please email [email or web address] or call [phone].     --------------------------------------------------------------------------------------------------------------------------------------------------  For shampooing -  Use luke warm water and HEAD AND SHOULDERS CLASSIC CLEAN SHAMPOO For skin use mild dove white bar soap with luke warm water and use triamcinolone topically twice a day on the scalp and the neck for 2 weeks Follow up with Dermatology

## 2019-07-05 NOTE — Patient Instructions (Addendum)
COVID-19 Vaccine Information can be found at: [email or web address] For questions related to vaccine distribution or appointments, please email [email or web address] or call [phone].    --------------------------------------------------------------------------------------------------------------------------------------------------  For shampooing -  Use luke warm water and HEAD AND SHOULDERS CLASSIC CLEAN SHAMPOO For skin use mild dove white bar soap with luke warm water and use triamcinolone topically twice a day on the scalp and the neck for 2 weeks Follow up with Dermatology

## 2019-07-06 LAB — LIPID PANEL
Chol/HDL Ratio: 2.4 ratio (ref 0.0–4.4)
Cholesterol, Total: 175 mg/dL (ref 100–199)
HDL: 72 mg/dL (ref >39)
LDL Chol Calc (NIH): 86 mg/dL (ref 0–99)
Triglycerides: 95 mg/dL (ref 0–149)
VLDL Cholesterol Cal: 17 mg/dL (ref 5–40)

## 2019-07-06 LAB — COMPREHENSIVE METABOLIC PANEL
ALT: 16 IU/L (ref 0–32)
AST: 19 IU/L (ref 0–40)
Albumin/Globulin Ratio: 1.3 (ref 1.2–2.2)
Albumin: 4.4 g/dL (ref 3.8–4.8)
Alkaline Phosphatase: 69 IU/L (ref 39–117)
BUN/Creatinine Ratio: 19 (ref 12–28)
BUN: 20 mg/dL (ref 8–27)
Bilirubin Total: 0.4 mg/dL (ref 0.0–1.2)
CO2: 22 mmol/L (ref 20–29)
Calcium: 9.6 mg/dL (ref 8.7–10.3)
Chloride: 102 mmol/L (ref 96–106)
Creatinine, Ser: 1.03 mg/dL — ABNORMAL HIGH (ref 0.57–1.00)
GFR calc Af Amer: 67 mL/min/{1.73_m2} (ref >59)
GFR calc non Af Amer: 58 mL/min/{1.73_m2} — ABNORMAL LOW (ref >59)
Globulin, Total: 3.3 g/dL (ref 1.5–4.5)
Glucose: 98 mg/dL (ref 65–99)
Potassium: 4.1 mmol/L (ref 3.5–5.2)
Sodium: 139 mmol/L (ref 134–144)
Total Protein: 7.7 g/dL (ref 6.0–8.5)

## 2019-07-06 LAB — TSH: TSH: 2.76 u[IU]/mL (ref 0.450–4.500)

## 2019-07-06 LAB — VITAMIN D 25 HYDROXY (VIT D DEFICIENCY, FRACTURES): Vit D, 25-Hydroxy: 53.9 ng/mL (ref 30.0–100.0)

## 2019-07-06 LAB — HIV ANTIBODY (ROUTINE TESTING W REFLEX): HIV Screen 4th Generation wRfx: NONREACTIVE

## 2019-07-16 ENCOUNTER — Encounter: Payer: Self-pay | Admitting: Family Medicine

## 2019-07-19 ENCOUNTER — Other Ambulatory Visit: Payer: Self-pay | Admitting: Family Medicine

## 2019-07-20 NOTE — Telephone Encounter (Signed)
Requested Prescriptions  Pending Prescriptions Disp Refills  . amLODipine (NORVASC) 10 MG tablet [Pharmacy Med Name: AMLODIPINE BESYLATE TABS 10MG ] 90 tablet 3    Sig: TAKE 1 TABLET DAILY     Cardiovascular:  Calcium Channel Blockers Failed - 07/19/2019  7:08 PM      Failed - Last BP in normal range    BP Readings from Last 1 Encounters:  07/05/19 (!) 148/73         Passed - Valid encounter within last 6 months    Recent Outpatient Visits          2 weeks ago Encounter for health maintenance examination in adult   Primary Care at [facility], Zoe A, MD   3 months ago PTSD (post-traumatic stress disorder)   Primary Care at [facility], Zoe A, MD   4 months ago Severe anxiety   [facility] at [facility], Arlie Solomons, MD   4 months ago    Primary Care at [facility], Arlie Solomons, MD   6 months ago Palpitations   Primary Care at [facility], [place] A, MD             . lisinopril (ZESTRIL) 20 MG tablet [Pharmacy Med Name: LISINOPRIL TABS 20MG ] 90 tablet 3    Sig: TAKE 1 TABLET DAILY     Cardiovascular:  ACE Inhibitors Failed - 07/19/2019  7:08 PM      Failed - Cr in normal range and within 180 days    Creat  Date Value Ref Range Status  10/16/2015 0.82 0.50 - 1.05 mg/dL Final   Creatinine, Ser  Date Value Ref Range Status  07/05/2019 1.03 (H) 0.57 - 1.00 mg/dL Final         Failed - Last BP in normal range    BP Readings from Last 1 Encounters:  07/05/19 (!) 148/73         Passed - K in normal range and within 180 days    Potassium  Date Value Ref Range Status  07/05/2019 4.1 3.5 - 5.2 mmol/L Final         Passed - Patient is not pregnant      Passed - Valid encounter within last 6 months    Recent Outpatient Visits          2 weeks ago Encounter for health maintenance examination in adult   Primary Care at [facility], Arlie Solomons, MD   3 months ago PTSD (post-traumatic stress disorder)   Primary Care at [facility], Arlie Solomons, MD   4 months ago  Severe anxiety   Primary Care at [facility], Arlie Solomons, MD   4 months ago    Primary Care at [facility], Arlie Solomons, MD   6 months ago Palpitations   Primary Care at [facility], Arlie Solomons, MD

## 2019-07-21 ENCOUNTER — Other Ambulatory Visit: Payer: Self-pay | Admitting: Family Medicine

## 2019-07-26 ENCOUNTER — Encounter: Payer: Self-pay | Admitting: Family Medicine

## 2019-07-26 LAB — HM DIABETES EYE EXAM

## 2019-07-27 ENCOUNTER — Encounter: Payer: Self-pay | Admitting: Family Medicine

## 2019-07-27 LAB — HM DIABETES EYE EXAM

## 2019-07-28 LAB — HM DIABETES EYE EXAM

## 2019-08-23 ENCOUNTER — Other Ambulatory Visit: Payer: Self-pay

## 2019-08-23 ENCOUNTER — Ambulatory Visit (INDEPENDENT_AMBULATORY_CARE_PROVIDER_SITE_OTHER): Admitting: Dermatology

## 2019-08-23 DIAGNOSIS — L658 Other specified nonscarring hair loss: Secondary | ICD-10-CM | POA: Diagnosis not present

## 2019-08-23 MED ORDER — HALOBETASOL PROPIONATE 0.05 % EX CREA: TOPICAL_CREAM | Freq: Two times a day (BID) | CUTANEOUS | 0 refills | Status: AC | PRN

## 2019-08-23 NOTE — Progress Notes (Signed)
   New Patient Visit  Subjective  Emily Lopez is a 64 y.o. female who presents for the following: Alopecia (1 month treated with triamcinolone 0.1% oint given by pcp). Alopecia She complains of hair loss. The hair loss is localized in distribution, with onset approximately 1 month ago. She describes symptoms of hair breaking. She denies scalp tenderness. She does not have family history of hair loss. She does not have dietary restrictions. She does wear a high tension hair style. She had no serious medical illnesses or major weight loss during time of hair loss.     Objective  Well appearing patient in no apparent distress; mood and affect are within normal limits.  A focused examination was performed including head, including the scalp, face, neck, nose, ears, eyelids, and lips. Relevant physical exam findings are noted in the Assessment and Plan.  No skin findings found.  Assessment & Plan  Examination showed some loss of density with hair breakage mainly in the front scalp.  There were no swollen glands and fingernails were normal.  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  We discussed future biopsy to exclude frontal fibrosing alopecia.  She will first try 10 to 12-weeks treatment with either halobetasol or clobetasol foam.  Recheck at that time.

## 2019-11-01 ENCOUNTER — Other Ambulatory Visit: Payer: Self-pay

## 2019-11-01 DIAGNOSIS — I10 Essential (primary) hypertension: Secondary | ICD-10-CM

## 2019-11-01 MED ORDER — METOPROLOL TARTRATE 25 MG PO TABS: ORAL_TABLET | ORAL | 0 refills | Status: DC

## 2020-01-19 ENCOUNTER — Other Ambulatory Visit: Payer: Self-pay

## 2020-01-19 ENCOUNTER — Ambulatory Visit (INDEPENDENT_AMBULATORY_CARE_PROVIDER_SITE_OTHER): Admitting: Registered Nurse

## 2020-01-19 ENCOUNTER — Encounter: Payer: Self-pay | Admitting: Registered Nurse

## 2020-01-19 VITALS — BP 151/84 | HR 70 | Temp 97.8°F | Resp 18 | Ht 67.0 in | Wt 196.2 lb

## 2020-01-19 DIAGNOSIS — M7661 Achilles tendinitis, right leg: Secondary | ICD-10-CM | POA: Diagnosis not present

## 2020-01-19 MED ORDER — DICLOFENAC SODIUM 75 MG PO TBEC: 75.0000 mg | DELAYED_RELEASE_TABLET | Freq: Two times a day (BID) | ORAL | 0 refills | Status: DC

## 2020-01-19 MED ORDER — METHOCARBAMOL 500 MG PO TABS: 500.0000 mg | ORAL_TABLET | Freq: Four times a day (QID) | ORAL | 0 refills | Status: DC

## 2020-01-19 NOTE — Progress Notes (Signed)
Acute Office Visit  Subjective:    Patient ID: AVRI PAIVA, female    DOB: 14-Feb-1956, 64 y.o.   MRN: [id number]  Chief Complaint  Patient presents with  . Ankle Pain    Patient states she has been having some right ankle pain for about a month that has been getting worst. SHe has not taken anything for pain but is more painful to bend and walk on    HPI Patient is in today for ankle pain  Right ankle Onset a few months ago, gradual Worsening. Posterior of heel. Some swelling posterior of lateral malleolus Full ROM Painful dorsiflexion Used to wear heels frequently - wearing heels helps prevent pain,but now wearing flats more often, pain is more often and more intense. Other foot not affected No recent acute injury but does not a number of ortho injuries to ankle in past.   Past Medical History:  Diagnosis Date  . Anxiety   . Arthritis   . Attention deficit disorder without mention of hyperactivity   . Depression   . Esophageal reflux   . Hypertension   . Iron deficiency anemia, unspecified   . Myalgia and myositis, unspecified   . Pneumonia    2008 or 2009  . Pre-diabetes   . Undiagnosed cardiac murmurs   . Unspecified vitamin D deficiency     Past Surgical History:  Procedure Laterality Date  . DILATATION & CURETTAGE/HYSTEROSCOPY WITH MYOSURE N/A 07/14/2018   Procedure: DILATATION & CURETTAGE/HYSTEROSCOPY with ultrasound guidance;  Surgeon: Nunzio Cobbs, MD;  Location: [facility];  Service: Gynecology;  Laterality: N/A;  ultrasound guidance needed. Follow previous case.  . NO PAST SURGERIES    . OPERATIVE ULTRASOUND N/A 07/14/2018   Procedure: OPERATIVE ULTRASOUND ultrasound guided hysteroscopy;  Surgeon: Nunzio Cobbs, MD;  Location: [facility];  Service: Gynecology;  Laterality: N/A;  ultrasound guided hysteroscopy/D&C due to cervical stenosis    Family History  Problem Relation Age of Onset  .  Hypertension Mother   . Diabetes Mother   . Arthritis Mother   . Hyperlipidemia Mother   . Lung cancer Father   . Heart attack Father   . Diabetes Brother   . Hypertension Sister   . Breast cancer Sister   . Hernia Sister     Social History   Socioeconomic History  . Marital status: Married    Spouse name: Not on file  . Number of children: Not on file  . Years of education: Not on file  . Highest education level: Not on file  Occupational History  . Not on file  Tobacco Use  . Smoking status: Never Smoker  . Smokeless tobacco: Former Systems developer    Types: Snuff  Vaping Use  . Vaping Use: Never used  Substance and Sexual Activity  . Alcohol use: Yes    Alcohol/week: 0.0 standard drinks    Comment: 2 drinks  . Drug use: Never  . Sexual activity: Not Currently    Birth control/protection: None, Post-menopausal  Other Topics Concern  . Not on file  Social History Narrative  . Not on file   Social Determinants of Health   Financial Resource Strain:   . Difficulty of Paying Living Expenses:   Food Insecurity:   . Worried About Charity fundraiser in the Last Year:   . Arboriculturist in the Last Year:   Transportation Needs:   . Film/video editor (Medical):   Marland Kitchen  Lack of Transportation (Non-Medical):   Physical Activity:   . Days of Exercise per Week:   . Minutes of Exercise per Session:   Stress:   . Feeling of Stress :   Social Connections:   . Frequency of Communication with Friends and Family:   . Frequency of Social Gatherings with Friends and Family:   . Attends Religious Services:   . Active Member of Clubs or Organizations:   . Attends Archivist Meetings:   Marland Kitchen Marital Status:   Intimate Partner Violence:   . Fear of Current or Ex-Partner:   . Emotionally Abused:   Marland Kitchen Physically Abused:   . Sexually Abused:     Outpatient Medications Prior to Visit  Medication Sig Dispense Refill  . amLODipine (NORVASC) 10 MG tablet TAKE 1 TABLET DAILY 90  tablet 3  . aspirin 81 MG EC tablet Take 1 tablet (81 mg total) by mouth daily. Swallow whole. 90 tablet 3  . buPROPion (WELLBUTRIN SR) 150 MG 12 hr tablet TAKE 1 TABLET(150 MG) BY MOUTH DAILY 30 tablet 3  . citalopram (CELEXA) 10 MG tablet Take 10 mg by mouth daily.    Marland Kitchen GARLIC PO Take 1 tablet by mouth.    Marland Kitchen lisinopril (ZESTRIL) 20 MG tablet TAKE 1 TABLET DAILY 90 tablet 3  . metoprolol tartrate (LOPRESSOR) 25 MG tablet TAKE 1/2 TABLET(12.5 MG) BY MOUTH DAILY AS NEEDED 90 tablet 0  . mirtazapine (REMERON) 7.5 MG tablet Take 1 tablet (7.5 mg total) by mouth at bedtime. 30 tablet 0  . Multiple Vitamin (MULTIVITAMIN) LIQD Take 5 mLs by mouth daily. [place]o  Nutri burst Liquid vitamin    . naproxen (NAPROSYN) 500 MG tablet Take 1 tablet (500 mg total) by mouth 2 (two) times daily. 30 tablet 0  . Prenatal Vit-Fe Fumarate-FA (PRENATAL MULTIVITAMIN) TABS tablet Take 1 tablet by mouth daily at 12 noon.    . triamcinolone ointment (KENALOG) 0.1 % Apply 1 application topically 2 (two) times daily. 30 g 0   No facility-administered medications prior to visit.    Allergies  Allergen Reactions  . No Known Allergies     Review of Systems  Constitutional: Negative.   HENT: Negative.   Eyes: Negative.   Respiratory: Negative.   Cardiovascular: Negative.   Gastrointestinal: Negative.   Endocrine: Negative.   Genitourinary: Negative.   Musculoskeletal: Positive for arthralgias.  Skin: Negative.   Allergic/Immunologic: Negative.   Neurological: Negative.   Hematological: Negative.   Psychiatric/Behavioral: Negative.   All other systems reviewed and are negative.      Objective:    Physical Exam Vitals and nursing note reviewed.  Constitutional:      General: She is not in acute distress.    Appearance: Normal appearance. She is normal weight. She is not ill-appearing, toxic-appearing or diaphoretic.  Cardiovascular:     Rate and Rhythm: Normal rate and regular rhythm.  Musculoskeletal:         General: Swelling present. No tenderness, deformity or signs of injury. Normal range of motion.     Right lower leg: No edema.     Left lower leg: No edema.     Comments: Thompson test shows intact achilles tendon. No ttp. Dorsiflexion elicits pain.  Skin:    General: Skin is warm and dry.     Capillary Refill: Capillary refill takes less than 2 seconds.     Coloration: Skin is not jaundiced or pale.     Findings: No bruising, erythema, lesion  or rash.  Neurological:     General: No focal deficit present.     Mental Status: She is alert and oriented to person, place, and time. Mental status is at baseline.  Psychiatric:        Mood and Affect: Mood normal.        Behavior: Behavior normal.        Thought Content: Thought content normal.        Judgment: Judgment normal.     BP (!) 151/84   Pulse 70   Temp 97.8 F (36.6 C) (Temporal)   Resp 18   Ht 5\' 7"  (1.702 m)   Wt 196 lb 3.2 oz (89 kg)   LMP 06/11/2011 (Within Years)   SpO2 96%   BMI 30.73 kg/m  Wt Readings from Last 3 Encounters:  01/19/20 196 lb 3.2 oz (89 kg)  07/05/19 197 lb 6.4 oz (89.5 kg)  01/01/19 190 lb (86.2 kg)    Health Maintenance Due  Topic Date Due  . HEMOGLOBIN A1C  01/02/2020  . INFLUENZA VACCINE  01/09/2020    There are no preventive care reminders to display for this patient.   Lab Results  Component Value Date   TSH 2.760 07/05/2019   Lab Results  Component Value Date   WBC 5.3 07/14/2018   HGB 12.6 07/14/2018   HCT 38.9 07/14/2018   MCV 83.7 07/14/2018   PLT 328 07/14/2018   Lab Results  Component Value Date   NA 139 07/05/2019   K 4.1 07/05/2019   CO2 22 07/05/2019   GLUCOSE 98 07/05/2019   BUN 20 07/05/2019   CREATININE 1.03 (H) 07/05/2019   BILITOT 0.4 07/05/2019   ALKPHOS 69 07/05/2019   AST 19 07/05/2019   ALT 16 07/05/2019   PROT 7.7 07/05/2019   ALBUMIN 4.4 07/05/2019   CALCIUM 9.6 07/05/2019   ANIONGAP 8 07/14/2018   Lab Results  Component Value Date    CHOL 175 07/05/2019   Lab Results  Component Value Date   HDL 72 07/05/2019   Lab Results  Component Value Date   LDLCALC 86 07/05/2019   Lab Results  Component Value Date   TRIG 95 07/05/2019   Lab Results  Component Value Date   CHOLHDL 2.4 07/05/2019   Lab Results  Component Value Date   HGBA1C 6.8 (A) 07/05/2019       Assessment & Plan:   Problem List Items Addressed This Visit    None    Visit Diagnoses    Achilles tendinitis of right lower extremity    -  Primary   Relevant Medications   diclofenac (VOLTAREN) 75 MG EC tablet   methocarbamol (ROBAXIN) 500 MG tablet       Meds ordered this encounter  Medications  . diclofenac (VOLTAREN) 75 MG EC tablet    Sig: Take 1 tablet (75 mg total) by mouth 2 (two) times daily.    Dispense:  90 tablet    Refill:  0    Order Specific Question:   Supervising Provider    Answer:   Carlota Raspberry, JEFFREY R [[id number]]  . methocarbamol (ROBAXIN) 500 MG tablet    Sig: Take 1 tablet (500 mg total) by mouth 4 (four) times daily.    Dispense:  60 tablet    Refill:  0    Order Specific Question:   Supervising Provider    Answer:   Carlota Raspberry, JEFFREY R [[id number]]   PLAN  Apparent typical achilles tendonitis   Diclofenac,  methocarbamol, and nonpharm discussed  Pt will return if pain does not begin to improve in 1-2 weeks  Patient encouraged to call clinic with any questions, comments, or concerns.  Maximiano Coss, NP

## 2020-01-19 NOTE — Patient Instructions (Signed)
° ° ° °  If you have lab work done today you will be contacted with your lab results within the next 2 weeks.  If you have not heard from us then please contact us. The fastest way to get your results is to register for My Chart. ° ° °IF you received an x-ray today, you will receive an invoice from [facility]. Please contact [facility] at [phone] with questions or concerns regarding your invoice.  ° °IF you received labwork today, you will receive an invoice from [facility]. Please contact [facility] at [phone] with questions or concerns regarding your invoice.  ° °Our billing staff will not be able to assist you with questions regarding bills from these companies. ° °You will be contacted with the lab results as soon as they are available. The fastest way to get your results is to activate your My Chart account. Instructions are located on the last page of this paperwork. If you have not heard from us regarding the results in 2 weeks, please contact this office. °  ° ° ° °

## 2020-02-28 ENCOUNTER — Other Ambulatory Visit: Payer: Self-pay | Admitting: Registered Nurse

## 2020-02-28 DIAGNOSIS — M7661 Achilles tendinitis, right leg: Secondary | ICD-10-CM

## 2020-02-29 NOTE — Telephone Encounter (Signed)
Appears this medication was prescribed by Kathrin Ruddy..See excerpt below from last note with Rich.  Plan for follow-up if symptoms were not improving.  Please schedule appointment if persistent need for medication/persistent symptoms.  ""Diclofenac, methocarbamol, and nonpharm discussed Pt will return if pain does not begin to improve in 1-2 weeks Patient encouraged to call clinic with any questions, comments, or concerns."

## 2020-03-07 ENCOUNTER — Telehealth: Payer: Self-pay | Admitting: Family Medicine

## 2020-03-07 NOTE — Telephone Encounter (Signed)
Is it ok to place referral?  Please Advise

## 2020-03-07 NOTE — Telephone Encounter (Signed)
Copied from [place] [phone]. Topic: Referral - Request for Referral >> Jan 26, 2020 12:10 PM Emily Lopez wrote: Has patient seen PCP for this complaint? Yes.   *If NO, is insurance requiring patient see PCP for this issue before PCP can refer them? Referral for which specialty: ortho  Preferred provider/office: no one in mind Reason for referral: she was in last week about her feet and would like to be referred to [facility] orth[facility] Dr about her feet .  Pulled tendon Best number -[phone] >> Jan 27, 2020 11:09 AM Emily Lopez wrote: Patient states she would like to go to     Dr. Berenice Primas- [facility], [place]   Please enter referral and I will be glad to process, thanks

## 2020-03-23 NOTE — Telephone Encounter (Signed)
Yes it is ok  Thanks  Kathrin Ruddy, NP

## 2020-03-31 ENCOUNTER — Other Ambulatory Visit: Payer: Self-pay

## 2020-03-31 DIAGNOSIS — M7661 Achilles tendinitis, right leg: Secondary | ICD-10-CM

## 2020-04-13 ENCOUNTER — Ambulatory Visit (INDEPENDENT_AMBULATORY_CARE_PROVIDER_SITE_OTHER): Admitting: Orthopaedic Surgery

## 2020-04-13 ENCOUNTER — Encounter: Payer: Self-pay | Admitting: Orthopaedic Surgery

## 2020-04-13 ENCOUNTER — Ambulatory Visit (INDEPENDENT_AMBULATORY_CARE_PROVIDER_SITE_OTHER)

## 2020-04-13 VITALS — Ht 66.0 in | Wt 197.0 lb

## 2020-04-13 DIAGNOSIS — M7661 Achilles tendinitis, right leg: Secondary | ICD-10-CM

## 2020-04-13 DIAGNOSIS — M9261 Juvenile osteochondrosis of tarsus, right ankle: Secondary | ICD-10-CM | POA: Diagnosis not present

## 2020-04-13 DIAGNOSIS — M9260 Juvenile osteochondrosis of tarsus, unspecified ankle: Secondary | ICD-10-CM

## 2020-04-13 DIAGNOSIS — M7671 Peroneal tendinitis, right leg: Secondary | ICD-10-CM | POA: Diagnosis not present

## 2020-04-13 MED ORDER — PREDNISONE 10 MG (21) PO TBPK: ORAL_TABLET | ORAL | 0 refills | Status: DC

## 2020-04-13 MED ORDER — DICLOFENAC SODIUM 75 MG PO TBEC: 75.0000 mg | DELAYED_RELEASE_TABLET | Freq: Two times a day (BID) | ORAL | 0 refills | Status: DC | PRN

## 2020-04-13 NOTE — Progress Notes (Signed)
Office Visit Note   Patient: Emily Lopez           Date of Birth: 26-Mar-1956           MRN: [id number] Visit Date: 04/13/2020              Requested by: Maximiano Coss, NP [place],  [place] [id number] PCP: Forrest Moron, MD   Assessment & Plan: Visit Diagnoses:  1. Haglund's deformity   2. Achilles tendinitis, right leg   3. Peroneal tendinitis, right leg     Plan: Impression is right foot Haglund's deformity, Achilles tendinitis and mild peroneal tendinitis. The patient rates her pain as a 10 out of 10 so we will place her in a cam walker weightbearing as tolerated. Have called in a steroid taper to take for now. Have also called in anti-inflammatories to start once she is finished with the steroid. We will also start her in formal physical therapy. She will follow up with [place] in 4 weeks time for recheck.  Follow-Up Instructions: Return in about 4 weeks (around 05/11/2020).   Orders:  Orders Placed This Encounter  Procedures  . XR Ankle Complete Right  . Ambulatory referral to Physical Therapy   Meds ordered this encounter  Medications  . predniSONE (STERAPRED UNI-PAK 21 TAB) 10 MG (21) TBPK tablet    Sig: Take as directed    Dispense:  21 tablet    Refill:  0  . diclofenac (VOLTAREN) 75 MG EC tablet    Sig: Take 1 tablet (75 mg total) by mouth 2 (two) times daily as needed.    Dispense:  60 tablet    Refill:  0      Procedures: No procedures performed   Clinical Data: No additional findings.   Subjective: Chief Complaint  Patient presents with  . Right Ankle - Pain    HPI patient is a pleasant 64 year old female who comes in today with right ankle pain for the past few months. No known injury or change in activity. All of her pain is to the posterior and lateral aspect. Walking seems to aggravate her symptoms. She has been wearing compression socks which do seem to help. She has taken Tylenol and occasional pain pill without significant relief  of symptoms. No numbness, tingling or burning.  Review of Systems as detailed in HPI. All others reviewed and are negative.   Objective: Vital Signs: Ht 5\' 6"  (1.676 m)   Wt 197 lb (89.4 kg)   LMP 06/11/2011 (Within Years)   BMI 31.80 kg/m   Physical Exam well-developed well-nourished female no acute distress. Alert and oriented x3.  Ortho Exam right ankle exam shows no swelling. Mild tenderness along the peroneal tendon. Moderate tenderness along the distal Achilles tendon at the attachment. No medial tenderness. No tenderness over the plantar fascial insertion at the heel. She can dorsiflex to neutral. Otherwise, full and painless range of motion. She is neurovascular intact distally.  Specialty Comments:  No specialty comments available.  Imaging: XR Ankle Complete Right  Result Date: 04/13/2020 X-rays show a very prominent retrocalcaneal spur as well as a spur at the plantar fascial attachment at the heel.    PMFS History: Patient Active Problem List   Diagnosis Date Noted  . Acute intractable headache 12/31/2018  . Neck pain 12/31/2018  . 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 Diet-controlled diabetes mellitus ([place]) 05/22/2017  . Class 1 obesity due to excess calories with serious comorbidity and body mass  index (BMI) of 30.0 to 30.9 in adult 05/22/2017  . Right leg swelling 10/21/2016  . VITAMIN D DEFICIENCY 03/13/2009  . DEPRESSIVE DISORDER 08/24/2008  . OVERWEIGHT 07/25/2008  . ATTENTION DEFICIT DISORDER, ADULT 07/25/2008  . SYSTOLIC MURMUR 67/34/1937  . GERD 07/20/2007  . ANEMIA, IRON DEFICIENCY, UNSPEC. 08/07/2006  . Essential hypertension 08/07/2006   Past Medical History:  Diagnosis Date  . Anxiety   . Arthritis   . Attention deficit disorder without mention of hyperactivity   . Depression   . Esophageal reflux   . Hypertension   . Iron deficiency anemia, unspecified   . Myalgia and myositis, unspecified   . Pneumonia    2008 or 2009  . Pre-diabetes   .  Undiagnosed cardiac murmurs   . Unspecified vitamin D deficiency     Family History  Problem Relation Age of Onset  . Hypertension Mother   . Diabetes Mother   . Arthritis Mother   . Hyperlipidemia Mother   . Lung cancer Father   . Heart attack Father   . Diabetes Brother   . Hypertension Sister   . Breast cancer Sister   . Hernia Sister     Past Surgical History:  Procedure Laterality Date  . DILATATION & CURETTAGE/HYSTEROSCOPY WITH MYOSURE N/A 07/14/2018   Procedure: DILATATION & CURETTAGE/HYSTEROSCOPY with ultrasound guidance;  Surgeon: Nunzio Cobbs, MD;  Location: [facility];  Service: Gynecology;  Laterality: N/A;  ultrasound guidance needed. Follow previous case.  . NO PAST SURGERIES    . OPERATIVE ULTRASOUND N/A 07/14/2018   Procedure: OPERATIVE ULTRASOUND ultrasound guided hysteroscopy;  Surgeon: Nunzio Cobbs, MD;  Location: [facility];  Service: Gynecology;  Laterality: N/A;  ultrasound guided hysteroscopy/D&C due to cervical stenosis   Social History   Occupational History  . Not on file  Tobacco Use  . Smoking status: Never Smoker  . Smokeless tobacco: Former Systems developer    Types: Snuff  Vaping Use  . Vaping Use: Never used  Substance and Sexual Activity  . Alcohol use: Yes    Alcohol/week: 0.0 standard drinks    Comment: 2 drinks  . Drug use: Never  . Sexual activity: Not Currently    Birth control/protection: None, Post-menopausal

## 2020-04-18 ENCOUNTER — Encounter: Payer: Self-pay | Admitting: Family Medicine

## 2020-04-18 ENCOUNTER — Other Ambulatory Visit: Payer: Self-pay

## 2020-04-18 ENCOUNTER — Ambulatory Visit (INDEPENDENT_AMBULATORY_CARE_PROVIDER_SITE_OTHER): Admitting: Family Medicine

## 2020-04-18 VITALS — BP 153/83 | HR 77 | Temp 97.8°F | Ht 66.0 in | Wt 199.0 lb

## 2020-04-18 DIAGNOSIS — R7989 Other specified abnormal findings of blood chemistry: Secondary | ICD-10-CM | POA: Diagnosis not present

## 2020-04-18 DIAGNOSIS — D219 Benign neoplasm of connective and other soft tissue, unspecified: Secondary | ICD-10-CM

## 2020-04-18 DIAGNOSIS — L659 Nonscarring hair loss, unspecified: Secondary | ICD-10-CM

## 2020-04-18 DIAGNOSIS — E2839 Other primary ovarian failure: Secondary | ICD-10-CM | POA: Diagnosis not present

## 2020-04-18 DIAGNOSIS — I10 Essential (primary) hypertension: Secondary | ICD-10-CM

## 2020-04-18 DIAGNOSIS — Z131 Encounter for screening for diabetes mellitus: Secondary | ICD-10-CM

## 2020-04-18 DIAGNOSIS — Z130 Encounter for screening for diseases of the blood and blood-forming organs and certain disorders involving the immune mechanism: Secondary | ICD-10-CM

## 2020-04-18 DIAGNOSIS — R7303 Prediabetes: Secondary | ICD-10-CM

## 2020-04-18 DIAGNOSIS — Z6832 Body mass index (BMI) 32.0-32.9, adult: Secondary | ICD-10-CM

## 2020-04-18 LAB — CMP14+EGFR
ALT: 22 IU/L (ref 0–32)
AST: 20 IU/L (ref 0–40)
Albumin/Globulin Ratio: 1.4 (ref 1.2–2.2)
Albumin: 4.5 g/dL (ref 3.8–4.8)
Alkaline Phosphatase: 69 IU/L (ref 44–121)
BUN/Creatinine Ratio: 14 (ref 12–28)
BUN: 16 mg/dL (ref 8–27)
Bilirubin Total: 0.5 mg/dL (ref 0.0–1.2)
CO2: 25 mmol/L (ref 20–29)
Calcium: 10 mg/dL (ref 8.7–10.3)
Chloride: 103 mmol/L (ref 96–106)
Creatinine, Ser: 1.13 mg/dL — ABNORMAL HIGH (ref 0.57–1.00)
GFR calc Af Amer: 59 mL/min/{1.73_m2} — ABNORMAL LOW (ref >59)
GFR calc non Af Amer: 51 mL/min/{1.73_m2} — ABNORMAL LOW (ref >59)
Globulin, Total: 3.2 g/dL (ref 1.5–4.5)
Glucose: 117 mg/dL — ABNORMAL HIGH (ref 65–99)
Potassium: 4.2 mmol/L (ref 3.5–5.2)
Sodium: 142 mmol/L (ref 134–144)
Total Protein: 7.7 g/dL (ref 6.0–8.5)

## 2020-04-18 LAB — CBC
Hematocrit: 37.2 % (ref 34.0–46.6)
Hemoglobin: 12.4 g/dL (ref 11.1–15.9)
MCH: 27.1 pg (ref 26.6–33.0)
MCHC: 33.3 g/dL (ref 31.5–35.7)
MCV: 81 fL (ref 79–97)
Platelets: 327 10*3/uL (ref 150–450)
RBC: 4.57 x10E6/uL (ref 3.77–5.28)
RDW: 15.8 % — ABNORMAL HIGH (ref 11.7–15.4)
WBC: 5.5 10*3/uL (ref 3.4–10.8)

## 2020-04-18 LAB — HEMOGLOBIN A1C
Est. average glucose Bld gHb Est-mCnc: 137 mg/dL
Hgb A1c MFr Bld: 6.4 % — ABNORMAL HIGH (ref 4.8–5.6)

## 2020-04-18 MED ORDER — LISINOPRIL 20 MG PO TABS: 20.0000 mg | ORAL_TABLET | Freq: Every day | ORAL | 3 refills | Status: DC

## 2020-04-18 MED ORDER — TRIAMCINOLONE ACETONIDE 0.1 % EX OINT: 1.0000 "application " | TOPICAL_OINTMENT | Freq: Two times a day (BID) | CUTANEOUS | 0 refills | Status: DC

## 2020-04-18 MED ORDER — AMLODIPINE BESYLATE 10 MG PO TABS: 10.0000 mg | ORAL_TABLET | Freq: Every day | ORAL | 3 refills | Status: DC

## 2020-04-18 MED ORDER — METOPROLOL TARTRATE 25 MG PO TABS: ORAL_TABLET | ORAL | 3 refills | Status: DC

## 2020-04-18 NOTE — Patient Instructions (Addendum)
Please take BP daily Let me know if > 140/90  Health Maintenance, Female Adopting a healthy lifestyle and getting preventive care are important in promoting health and wellness. Ask your health care provider about:  The right schedule for you to have regular tests and exams.  Things you can do on your own to prevent diseases and keep yourself healthy. What should I know about diet, weight, and exercise? Eat a healthy diet   Eat a diet that includes plenty of vegetables, fruits, low-fat dairy products, and lean protein.  Do not eat a lot of foods that are high in solid fats, added sugars, or sodium. Maintain a healthy weight Body mass index (BMI) is used to identify weight problems. It estimates body fat based on height and weight. Your health care provider can help determine your BMI and help you achieve or maintain a healthy weight. Get regular exercise Get regular exercise. This is one of the most important things you can do for your health. Most adults should:  Exercise for at least 150 minutes each week. The exercise should increase your heart rate and make you sweat (moderate-intensity exercise).  Do strengthening exercises at least twice a week. This is in addition to the moderate-intensity exercise.  Spend less time sitting. Even light physical activity can be beneficial. Watch cholesterol and blood lipids Have your blood tested for lipids and cholesterol at 64 years of age, then have this test every 5 years. Have your cholesterol levels checked more often if:  Your lipid or cholesterol levels are high.  You are older than 64 years of age.  You are at high risk for heart disease. What should I know about cancer screening? Depending on your health history and family history, you may need to have cancer screening at various ages. This may include screening for:  Breast cancer.  Cervical cancer.  Colorectal cancer.  Skin cancer.  Lung cancer. What should I know  about heart disease, diabetes, and high blood pressure? Blood pressure and heart disease  High blood pressure causes heart disease and increases the risk of stroke. This is more likely to develop in people who have high blood pressure readings, are of African descent, or are overweight.  Have your blood pressure checked: ? Every 3-5 years if you are 10-22 years of age. ? Every year if you are 62 years old or older. Diabetes Have regular diabetes screenings. This checks your fasting blood sugar level. Have the screening done:  Once every three years after age 84 if you are at a normal weight and have a low risk for diabetes.  More often and at a younger age if you are overweight or have a high risk for diabetes. What should I know about preventing infection? Hepatitis B If you have a higher risk for hepatitis B, you should be screened for this virus. Talk with your health care provider to find out if you are at risk for hepatitis B infection. Hepatitis C Testing is recommended for:  Everyone born from [birth year] through 1965.  Anyone with known risk factors for hepatitis C. Sexually transmitted infections (STIs)  Get screened for STIs, including gonorrhea and chlamydia, if: ? You are sexually active and are younger than 64 years of age. ? You are older than 64 years of age and your health care provider tells you that you are at risk for this type of infection. ? Your sexual activity has changed since you were last screened, and you are at increased  risk for chlamydia or gonorrhea. Ask your health care provider if you are at risk.  Ask your health care provider about whether you are at high risk for HIV. Your health care provider may recommend a prescription medicine to help prevent HIV infection. If you choose to take medicine to prevent HIV, you should first get tested for HIV. You should then be tested every 3 months for as long as you are taking the medicine. Pregnancy  If you are about  to stop having your period (premenopausal) and you may become pregnant, seek counseling before you get pregnant.  Take 400 to 800 micrograms (mcg) of folic acid every day if you become pregnant.  Ask for birth control (contraception) if you want to prevent pregnancy. Osteoporosis and menopause Osteoporosis is a disease in which the bones lose minerals and strength with aging. This can result in bone fractures. If you are 51 years old or older, or if you are at risk for osteoporosis and fractures, ask your health care provider if you should:  Be screened for bone loss.  Take a calcium or vitamin D supplement to lower your risk of fractures.  Be given hormone replacement therapy (HRT) to treat symptoms of menopause. Follow these instructions at home: Lifestyle  Do not use any products that contain nicotine or tobacco, such as cigarettes, e-cigarettes, and chewing tobacco. If you need help quitting, ask your health care provider.  Do not use street drugs.  Do not share needles.  Ask your health care provider for help if you need support or information about quitting drugs. Alcohol use  Do not drink alcohol if: ? Your health care provider tells you not to drink. ? You are pregnant, may be pregnant, or are planning to become pregnant.  If you drink alcohol: ? Limit how much you use to 0-1 drink a day. ? Limit intake if you are breastfeeding.  Be aware of how much alcohol is in your drink. In the [place] one drink equals one 12 oz bottle of beer (355 mL), one 5 oz glass of wine (148 mL), or one 1 oz glass of hard liquor (44 mL). General instructions  Schedule regular health, dental, and eye exams.  Stay current with your vaccines.  Tell your health care provider if: ? You often feel depressed. ? You have ever been abused or do not feel safe at home. Summary  Adopting a healthy lifestyle and getting preventive care are important in promoting health and wellness.  Follow your  health care provider's instructions about healthy diet, exercising, and getting tested or screened for diseases.  Follow your health care provider's instructions on monitoring your cholesterol and blood pressure. This information is not intended to replace advice given to you by your health care provider. Make sure you discuss any questions you have with your health care provider. Document Revised: 05/20/2018 Document Reviewed: 05/20/2018 Elsevier Patient Education  [employer].     If you have lab work done today you will be contacted with your lab results within the next 2 weeks.  If you have not heard from [place] then please contact us. The fastest way to get your results is to register for My Chart.   IF you received an x-ray today, you will receive an invoice from [facility]. Please contact [facility] at [phone] with questions or concerns regarding your invoice.   IF you received labwork today, you will receive an invoice from [facility]. Please contact [facility] at [phone] with  questions or concerns regarding your invoice.   Our billing staff will not be able to assist you with questions regarding bills from these companies.  You will be contacted with the lab results as soon as they are available. The fastest way to get your results is to activate your My Chart account. Instructions are located on the last page of this paperwork. If you have not heard from [place] regarding the results in 2 weeks, please contact this office.

## 2020-04-18 NOTE — Progress Notes (Signed)
11/9/20219:48 AM  Emily Lopez 1955-11-03, 64 y.o., female [id number]  Chief Complaint  Patient presents with  . Fibroids    HPI:   Patient is a 64 y.o. female with past medical history significant for HTN who presents today for medication follow up and fibroids    Knows she has fibroids Denies pain and issues No frequent monitoring Plan for 2022 ultrasound for fibroids   Colonoscopy: done 09/27/2016 due 09/28/2026 Ortho Bone spurs that affected tendons Gave her prednisone for pain Next appointment in December    Depression screen [facility] 2/9 04/18/2020 01/19/2020 07/05/2019  Decreased Interest 1 0 0  Down, Depressed, Hopeless 1 0 0  PHQ - 2 Score 2 0 0  Altered sleeping 0 - -  Tired, decreased energy 0 - -  Change in appetite 1 - -  Feeling bad or failure about yourself  1 - -  Trouble concentrating 0 - -  Moving slowly or fidgety/restless 0 - -  Suicidal thoughts 0 - -  PHQ-9 Score 4 - -  Difficult doing work/chores Not difficult at all - -    Fall Risk  04/18/2020 01/19/2020 07/05/2019 03/29/2019 03/02/2019  Falls in the past year? 0 0 1 0 0  Number falls in past yr: 0 0 0 0 0  Injury with Fall? 0 0 0 0 0  Follow up Falls evaluation completed Falls evaluation completed Falls evaluation completed - Falls evaluation completed     Allergies  Allergen Reactions  . No Known Allergies     Prior to Admission medications   Medication Sig Start Date End Date Taking? Authorizing Provider  amLODipine (NORVASC) 10 MG tablet TAKE 1 TABLET DAILY 07/20/19  Yes Delia Chimes A, MD  aspirin 81 MG EC tablet Take 1 tablet (81 mg total) by mouth daily. Swallow whole. 09/27/16  Yes Stallings, Zoe A, MD  buPROPion (WELLBUTRIN SR) 150 MG 12 hr tablet TAKE 1 TABLET(150 MG) BY MOUTH DAILY 07/03/19  Yes Stallings, Zoe A, MD  citalopram (CELEXA) 10 MG tablet Take 10 mg by mouth daily. 03/29/19  Yes [provider]  diclofenac (VOLTAREN) 75 MG EC tablet Take 1 tablet (75 mg total)  by mouth 2 (two) times daily as needed. 04/13/20  Yes Stanbery, Mary L, PA-C  GARLIC PO Take 1 tablet by mouth.   Yes [provider]  lisinopril (ZESTRIL) 20 MG tablet TAKE 1 TABLET DAILY 07/20/19  Yes Stallings, Zoe A, MD  methocarbamol (ROBAXIN) 500 MG tablet Take 1 tablet (500 mg total) by mouth 4 (four) times daily. 01/19/20  Yes Maximiano Coss, NP  metoprolol tartrate (LOPRESSOR) 25 MG tablet TAKE 1/2 TABLET(12.5 MG) BY MOUTH DAILY AS NEEDED 11/01/19  Yes Stallings, Zoe A, MD  mirtazapine (REMERON) 7.5 MG tablet Take 1 tablet (7.5 mg total) by mouth at bedtime. 03/02/19  Yes Forrest Moron, MD  Multiple Vitamin (MULTIVITAMIN) LIQD Take 5 mLs by mouth daily. [place]  Nutri burst Liquid vitamin   Yes [provider]  Prenatal Vit-Fe Fumarate-FA (PRENATAL MULTIVITAMIN) TABS tablet Take 1 tablet by mouth daily at 12 noon.   Yes [provider]  triamcinolone ointment (KENALOG) 0.1 % Apply 1 application topically 2 (two) times daily. 07/05/19  Yes Stallings, Zoe A, MD  naproxen (NAPROSYN) 500 MG tablet Take 1 tablet (500 mg total) by mouth 2 (two) times daily. Patient not taking: Reported on 04/18/2020 08/13/17   Blue, Olivia C, PA-C  predniSONE (STERAPRED UNI-PAK 21 TAB) 10 MG (21) TBPK  tablet Take as directed Patient not taking: Reported on 04/18/2020 04/13/20   Nathaniel Man    Past Medical History:  Diagnosis Date  . Anxiety   . Arthritis   . Attention deficit disorder without mention of hyperactivity   . Depression   . Esophageal reflux   . Hypertension   . Iron deficiency anemia, unspecified   . Myalgia and myositis, unspecified   . Pneumonia    2008 or 2009  . Pre-diabetes   . Undiagnosed cardiac murmurs   . Unspecified vitamin D deficiency     Past Surgical History:  Procedure Laterality Date  . DILATATION & CURETTAGE/HYSTEROSCOPY WITH MYOSURE N/A 07/14/2018   Procedure: DILATATION & CURETTAGE/HYSTEROSCOPY with ultrasound guidance;  Surgeon: Nunzio Cobbs, MD;  Location: [facility];  Service: Gynecology;  Laterality: N/A;  ultrasound guidance needed. Follow previous case.  . NO PAST SURGERIES    . OPERATIVE ULTRASOUND N/A 07/14/2018   Procedure: OPERATIVE ULTRASOUND ultrasound guided hysteroscopy;  Surgeon: Nunzio Cobbs, MD;  Location: [facility];  Service: Gynecology;  Laterality: N/A;  ultrasound guided hysteroscopy/D&C due to cervical stenosis    Social History   Tobacco Use  . Smoking status: Never Smoker  . Smokeless tobacco: Former Systems developer    Types: Snuff  Substance Use Topics  . Alcohol use: Yes    Alcohol/week: 0.0 standard drinks    Comment: 2 drinks    Family History  Problem Relation Age of Onset  . Hypertension Mother   . Diabetes Mother   . Arthritis Mother   . Hyperlipidemia Mother   . Lung cancer Father   . Heart attack Father   . Diabetes Brother   . Hypertension Sister   . Breast cancer Sister   . Hernia Sister     Review of Systems  Constitutional: Negative for chills, fever and malaise/fatigue.  Eyes: Negative for blurred vision and double vision.  Respiratory: Negative for cough, shortness of breath and wheezing.   Cardiovascular: Negative for chest pain, palpitations and leg swelling.  Gastrointestinal: Negative for abdominal pain, blood in stool, constipation, diarrhea, heartburn, nausea and vomiting.  Genitourinary: Negative for dysuria, frequency and hematuria.  Musculoskeletal: Negative for back pain and joint pain.  Skin: Negative for rash.  Neurological: Negative for dizziness, weakness and headaches.     OBJECTIVE:  Today's Vitals   04/18/20 0846  BP: (!) 153/83  Pulse: 77  Temp: 97.8 F (36.6 C)  SpO2: 97%  Weight: 199 lb (90.3 kg)  Height: _0  (1.676 m)   Body mass index is 32.12 kg/m.  BP Readings from Last 3 Encounters:  04/18/20 (!) 153/83  01/19/20 (!) 151/84  07/05/19 (!) 148/73    Physical  Exam Constitutional:      General: She is not in acute distress.    Appearance: Normal appearance. She is not ill-appearing.  HENT:     Head: Normocephalic.  Cardiovascular:     Rate and Rhythm: Normal rate and regular rhythm.     Pulses: Normal pulses.     Heart sounds: Normal heart sounds. No murmur heard.  No friction rub. No gallop.   Pulmonary:     Effort: Pulmonary effort is normal. No respiratory distress.     Breath sounds: Normal breath sounds. No stridor. No wheezing, rhonchi or rales.  Abdominal:     General: Bowel sounds are normal.     Palpations: Abdomen is soft.  Tenderness: There is no abdominal tenderness.  Musculoskeletal:        General: Signs of injury (Right foot, boot in place) present.     Right lower leg: No edema.     Left lower leg: No edema.  Skin:    General: Skin is warm and dry.  Neurological:     Mental Status: She is alert and oriented to person, place, and time.  Psychiatric:        Mood and Affect: Mood normal.        Behavior: Behavior normal.     No results found for this or any previous visit (from the past 24 hour(s)).  No results found.   ASSESSMENT and PLAN  Problem List Items Addressed This Visit      Cardiovascular and Mediastinum   Essential hypertension   Relevant Medications   metoprolol tartrate (LOPRESSOR) 25 MG tablet   lisinopril (ZESTRIL) 20 MG tablet   amLODipine (NORVASC) 10 MG tablet Remains > 140/90 Encouraged to take BP daily at home Did not want to increase medications at this time.    Other Visit Diagnoses    Estrogen deficiency    -  Primary   Relevant Orders   DG Bone Density   Hair loss       Relevant Medications   triamcinolone ointment (KENALOG) 0.1 %   Screening for diabetes mellitus       Relevant Orders   Hemoglobin A1c   Elevated serum creatinine       Relevant Orders   CMP14+EGFR   Screening for deficiency anemia       Relevant Orders   CBC   BMI 32.0-32.9,adult       Relevant  Orders   Amb Ref to Medical Weight Management   Fibroids     Denies s/s at this time Will continue to monitor as needed Biopsy in 2002 was negative    Will follow up with lab results.   Return in about 6 months (around 10/16/2020).    Huston Foley Miriam Kestler, FNP-BC [facility] [place], [place] [address] Ph.  [phone] Fax [phone]

## 2020-04-20 ENCOUNTER — Encounter: Payer: Self-pay | Admitting: Family Medicine

## 2020-04-28 ENCOUNTER — Encounter: Payer: Self-pay | Admitting: Family Medicine

## 2020-05-10 ENCOUNTER — Encounter: Payer: Self-pay | Admitting: Family Medicine

## 2020-05-11 ENCOUNTER — Ambulatory Visit (INDEPENDENT_AMBULATORY_CARE_PROVIDER_SITE_OTHER): Admitting: Orthopaedic Surgery

## 2020-05-11 ENCOUNTER — Telehealth: Payer: Self-pay | Admitting: Family Medicine

## 2020-05-11 ENCOUNTER — Encounter: Payer: Self-pay | Admitting: Orthopaedic Surgery

## 2020-05-11 ENCOUNTER — Other Ambulatory Visit: Payer: Self-pay

## 2020-05-11 VITALS — Ht 66.0 in | Wt 199.0 lb

## 2020-05-11 DIAGNOSIS — M9260 Juvenile osteochondrosis of tarsus, unspecified ankle: Secondary | ICD-10-CM

## 2020-05-11 DIAGNOSIS — M7661 Achilles tendinitis, right leg: Secondary | ICD-10-CM | POA: Diagnosis not present

## 2020-05-11 NOTE — Telephone Encounter (Signed)
Office that pt is referred to that [facility] left a message yesterday. She is called and stated that there office right now is not taking on any ned pt for this year and she also stated that she has about 400 pts that are on a waiting list so she will get to her when she get to her on her list. Please adcise.

## 2020-05-11 NOTE — Telephone Encounter (Signed)
Called left message for pt to call back in reference to the message

## 2020-05-11 NOTE — Progress Notes (Signed)
Office Visit Note   Patient: Emily Lopez           Date of Birth: 1956/03/07           MRN: [id number] Visit Date: 05/11/2020              Requested by: Forrest Moron, MD [phone] [place],  [place] [address] PCP: Forrest Moron, MD   Assessment & Plan: Visit Diagnoses:  1. Achilles tendinitis, right leg   2. Haglund's deformity     Plan: Impression is resolving right heel Achilles tendinitis.  At this point, she may transition into a regular shoe weightbearing as tolerated.  She will start physical therapy next week.  Follow-up with [place] as needed.  Follow-Up Instructions: Return if symptoms worsen or fail to improve.   Orders:  No orders of the defined types were placed in this encounter.  No orders of the defined types were placed in this encounter.     Procedures: No procedures performed   Clinical Data: No additional findings.   Subjective: Chief Complaint  Patient presents with  . Right Foot - Follow-up    HPI patient is a very pleasant 64 year old female who comes in today for follow-up of her right ankle Achilles tendinitis.  She is significantly better after wearing her boot for the past 4 weeks.  She notes that she never had to take the prednisone or anti-inflammatories.  She is scheduled to start physical therapy next week.    Review of Systems as detailed in HPI.  All others reviewed and are negative.   Objective: Vital Signs: Ht 5\' 6"  (1.676 m)   Wt 199 lb (90.3 kg)   LMP 06/11/2011 (Within Years)   BMI 32.12 kg/m   Physical Exam well-developed well-nourished female no acute distress.  Alert oriented x3.  Ortho Exam right ankle exam shows no tenderness along the Achilles tendon.  No tenderness along the peroneal or posterior tibial tendons.  Full range of motion of the ankle.  She is neurovascular intact distally.  Specialty Comments:  No specialty comments available.  Imaging: No new imaging   PMFS  History: Patient Active Problem List   Diagnosis Date Noted  . Pre-diabetes 04/18/2020  . Acute intractable headache 12/31/2018  . Neck pain 12/31/2018  . 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 Class 1 obesity due to excess calories with serious comorbidity and body mass index (BMI) of 30.0 to 30.9 in adult 05/22/2017  . Right leg swelling 10/21/2016  . VITAMIN D DEFICIENCY 03/13/2009  . DEPRESSIVE DISORDER 08/24/2008  . OVERWEIGHT 07/25/2008  . ATTENTION DEFICIT DISORDER, ADULT 07/25/2008  . SYSTOLIC MURMUR 74/25/9563  . GERD 07/20/2007  . ANEMIA, IRON DEFICIENCY, UNSPEC. 08/07/2006  . Essential hypertension 08/07/2006   Past Medical History:  Diagnosis Date  . Anxiety   . Arthritis   . Attention deficit disorder without mention of hyperactivity   . Depression   . Esophageal reflux   . Hypertension   . Iron deficiency anemia, unspecified   . Myalgia and myositis, unspecified   . Pneumonia    2008 or 2009  . Pre-diabetes   . Undiagnosed cardiac murmurs   . Unspecified vitamin D deficiency     Family History  Problem Relation Age of Onset  . Hypertension Mother   . Diabetes Mother   . Arthritis Mother   . Hyperlipidemia Mother   . Lung cancer Father   . Heart attack Father   .  Diabetes Brother   . Hypertension Sister   . Breast cancer Sister   . Hernia Sister     Past Surgical History:  Procedure Laterality Date  . DILATATION & CURETTAGE/HYSTEROSCOPY WITH MYOSURE N/A 07/14/2018   Procedure: DILATATION & CURETTAGE/HYSTEROSCOPY with ultrasound guidance;  Surgeon: Nunzio Cobbs, MD;  Location: [facility];  Service: Gynecology;  Laterality: N/A;  ultrasound guidance needed. Follow previous case.  . NO PAST SURGERIES    . OPERATIVE ULTRASOUND N/A 07/14/2018   Procedure: OPERATIVE ULTRASOUND ultrasound guided hysteroscopy;  Surgeon: Nunzio Cobbs, MD;  Location: [facility];  Service: Gynecology;  Laterality: N/A;  ultrasound  guided hysteroscopy/D&C due to cervical stenosis   Social History   Occupational History  . Not on file  Tobacco Use  . Smoking status: Never Smoker  . Smokeless tobacco: Former Systems developer    Types: Snuff  Vaping Use  . Vaping Use: Never used  Substance and Sexual Activity  . Alcohol use: Yes    Alcohol/week: 0.0 standard drinks    Comment: 2 drinks  . Drug use: Never  . Sexual activity: Not Currently    Birth control/protection: None, Post-menopausal

## 2020-05-15 ENCOUNTER — Ambulatory Visit: Attending: Physician Assistant

## 2020-05-15 DIAGNOSIS — M25571 Pain in right ankle and joints of right foot: Secondary | ICD-10-CM | POA: Insufficient documentation

## 2020-05-15 DIAGNOSIS — M79661 Pain in right lower leg: Secondary | ICD-10-CM | POA: Insufficient documentation

## 2020-05-15 DIAGNOSIS — R2689 Other abnormalities of gait and mobility: Secondary | ICD-10-CM | POA: Insufficient documentation

## 2020-05-23 ENCOUNTER — Encounter: Payer: Self-pay | Admitting: Family Medicine

## 2020-05-30 ENCOUNTER — Encounter: Payer: Self-pay | Admitting: Physical Therapy

## 2020-05-30 ENCOUNTER — Other Ambulatory Visit: Payer: Self-pay

## 2020-05-30 ENCOUNTER — Ambulatory Visit: Admitting: Physical Therapy

## 2020-05-30 DIAGNOSIS — M79661 Pain in right lower leg: Secondary | ICD-10-CM | POA: Diagnosis not present

## 2020-05-30 DIAGNOSIS — M25571 Pain in right ankle and joints of right foot: Secondary | ICD-10-CM | POA: Diagnosis present

## 2020-05-30 DIAGNOSIS — R2689 Other abnormalities of gait and mobility: Secondary | ICD-10-CM | POA: Diagnosis present

## 2020-05-30 NOTE — Therapy (Signed)
[place], [place], [address] Phone: [phone]   Fax:  [phone]  Physical Therapy Evaluation  Patient Details  Name: Emily Lopez MRN: [id number] Date of Birth: 02-05-1956 Referring Provider (PT): Dr. Erlinda Hong, Nancy Fetter, [place]   Encounter Date: 05/30/2020   PT End of Session - 05/30/20 1810    Visit Number 1    Number of Visits 6    Date for PT Re-Evaluation 07/11/20    PT Start Time 0750    PT Stop Time 0835    PT Time Calculation (min) 45 min    Activity Tolerance Patient tolerated treatment well    Behavior During Therapy [facility] for tasks assessed/performed           Past Medical History:  Diagnosis Date  . Anxiety   . Arthritis   . Attention deficit disorder without mention of hyperactivity   . Depression   . Esophageal reflux   . Hypertension   . Iron deficiency anemia, unspecified   . Myalgia and myositis, unspecified   . Pneumonia    2008 or 2009  . Pre-diabetes   . Undiagnosed cardiac murmurs   . Unspecified vitamin D deficiency     Past Surgical History:  Procedure Laterality Date  . DILATATION & CURETTAGE/HYSTEROSCOPY WITH MYOSURE N/A 07/14/2018   Procedure: DILATATION & CURETTAGE/HYSTEROSCOPY with ultrasound guidance;  Surgeon: Nunzio Cobbs, MD;  Location: [facility];  Service: Gynecology;  Laterality: N/A;  ultrasound guidance needed. Follow previous case.  . NO PAST SURGERIES    . OPERATIVE ULTRASOUND N/A 07/14/2018   Procedure: OPERATIVE ULTRASOUND ultrasound guided hysteroscopy;  Surgeon: Nunzio Cobbs, MD;  Location: [facility];  Service: Gynecology;  Laterality: N/A;  ultrasound guided hysteroscopy/D&C due to cervical stenosis    There were no vitals filed for this visit.    Subjective Assessment - 05/30/20 0800    Subjective Pt with new onset of ankle pain about 2-3 mos ago.  She wore a boot on for 4 weeks which  helped a great deal.  She was told she had a bone spur. She continues to have pain in her Rt ankle , lower leg at times.  Her calf cramps every now and then.  She has some difficulty walking.  She denies weakness, sensory changes. She has been paying attention her footwear, recently bought a new pair of Brooks.  Dr. told her she didnt need to wear an orthotic.    Pertinent History HTN, Haglund's Deformity    Limitations House hold activities;Lifting;Standing;Walking    Patient Stated Goals Pt would like to learn how to help stop the pain .    Currently in Pain? No/denies    Pain Score --   can be mod at times   Pain Location Ankle    Pain Orientation Right    Pain Descriptors / Indicators Stabbing;Sharp    Pain Type Acute pain    Pain Radiating Towards back of ankle    Pain Onset More than a month ago    Pain Frequency Intermittent    Aggravating Factors  as the day goes on    Pain Relieving Factors changed footwear, rolling pin              [facility] PT Assessment - 05/30/20 0001      Assessment   Medical Diagnosis Peroneal tendinitis, achilles tendonitis    Referring Provider (PT) Dr. 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, babysitting at times    Leisure paint, likes to walk for exercise      Cognition   Overall Cognitive Status Within Functional Limits for tasks assessed      Observation/Other Assessments   Focus on Therapeutic Outcomes (FOTO)  59% able      Figure 8 Edema   Figure 8 - Right  21  cm    Figure 8 - Left  21.75 cm      Sensation   Light Touch Appears Intact      Coordination   Gross Motor Movements are Fluid and Coordinated Not tested      Functional Tests   Functional tests Squat;Single leg stance      Single Leg Stance   Comments fair on Rt LE, (< 20 sec)      Posture/Postural Control   Posture/Postural Control Postural limitations    Posture Comments genu varus, mild pes planus Rt >L , calcaneal eversion, Rt foot pronated >L      AROM   Right Ankle Dorsiflexion 10    Right Ankle Inversion 31    Right Ankle Eversion 20    Left Ankle Inversion 20    Left Ankle Eversion 20      Strength   Right Ankle Dorsiflexion 5/5    Right Ankle Plantar Flexion 4/5    Right Ankle Inversion 4+/5    Right Ankle Eversion 4+/5    Left Ankle Dorsiflexion 5/5      Palpation   Palpation comment pain at calcanceus  and just medial and lateral , none in calf today             Objective measurements completed on examination: See above findings.     PT Education - 05/30/20 1809    Education Details PT/POC, HEP, stretching , bone spur formation, tendinitis    Person(s) Educated Patient    Methods Explanation;Handout    Comprehension Verbalized understanding;Verbal cues required               PT Long Term Goals - 05/30/20 1810      PT LONG TERM GOAL #1   Title Pt will be able to improve FOTO score to >70% to show functional improvement    Time 6    Period Weeks    Status New    Target Date 07/11/20      PT LONG TERM GOAL #2   Title Pt will be I with HEP for lower leg strength, flexibility and proprioception.    Time 6    Period Weeks    Status New    Target Date 07/11/20      PT LONG TERM GOAL #3   Title Pt will be able to walk a mile without increased pain in Rt LE , ankle    Time 6  Period Weeks    Status New    Target Date 07/11/20      PT LONG TERM GOAL #4   Title Pt will able to stand on Rt LE for 30 sec for min dynamic activity     Time 6    Period Weeks    Status New    Target Date 07/11/20      PT LONG TERM GOAL #5   Title Pt will perform 10 or more heel raises on rt LE to demo increased strength    Time 6    Period Weeks    Status New    Target Date 07/11/20                  Plan - 05/30/20 1816    Clinical Impression Statement Patient presents for low complexity eval of Rt peroneal tendinitis and achilles tendinitis that has resolved quite a bit from the inital onset of the injury. Both ankles present with swelling, L >R and she relates this to sleeping with legs in the dependent position. Ankle strength is good other than bilateral plantaflexion strength 4/5.  She has decreased single leg stability especially in Rt LE. She may benefit from custom orthotics but would like to see how she does with PT and her new Chartered loss adjuster.    Examination-Activity Limitations Squat;Locomotion Level;Stand    Examination-Participation Restrictions Community Activity    Stability/Clinical Decision Making Stable/Uncomplicated    Clinical Decision Making Low    Rehab Potential Excellent    PT Frequency 1x / week    PT Duration 6 weeks    PT Treatment/Interventions ADLs/Self Care Home Management;Cryotherapy;Therapeutic exercise;Patient/family education;Taping;Manual techniques;Dry needling;Therapeutic activities;Moist Heat;Ultrasound;Iontophoresis 4mg /ml Dexamethasone;Neuromuscular re-education    PT Next Visit Plan check HEP, bike for warm up. look at squats. Consider Ionto. SLS and calf raise eccentric    PT Home Exercise Plan gastoc/soleus, SLS, heels raises    Consulted and Agree with Plan of Care Patient           Patient will benefit from skilled therapeutic intervention in order to improve the following deficits and impairments:  Decreased mobility,Obesity,Pain,Postural dysfunction,Impaired flexibility,Increased fascial restricitons,Decreased strength  Visit Diagnosis: Pain in right lower leg  Pain in right  ankle and joints of right foot  Other abnormalities of gait and mobility     Problem List Patient Active Problem List   Diagnosis Date Noted  . Pre-diabetes 04/18/2020  . Acute intractable headache 12/31/2018  . Neck pain 12/31/2018  . 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 Class 1 obesity due to excess calories with serious comorbidity and body mass index (BMI) of 30.0 to 30.9 in adult 05/22/2017  . Right leg swelling 10/21/2016  . VITAMIN D DEFICIENCY 03/13/2009  . DEPRESSIVE DISORDER 08/24/2008  . OVERWEIGHT 07/25/2008  . ATTENTION DEFICIT DISORDER, ADULT 07/25/2008  . SYSTOLIC MURMUR 68/61/6837  . GERD 07/20/2007  . ANEMIA, IRON DEFICIENCY, UNSPEC. 08/07/2006  . Essential hypertension 08/07/2006    Jamari Diana 05/30/2020, 6:33 PM  [facility] [address] [place], [place], [address] Phone: [phone]   Fax:  [phone]  Name: IRAIDA CRAGIN MRN: [id number] Date of Birth: 06/23/1955   Raeford Razor, PT 05/30/20 6:33 PM Phone: [phone] Fax: [phone]

## 2020-06-05 ENCOUNTER — Encounter: Payer: Self-pay | Admitting: Family Medicine

## 2020-06-06 ENCOUNTER — Ambulatory Visit: Admitting: Physical Therapy

## 2020-06-06 ENCOUNTER — Encounter: Payer: Self-pay | Admitting: Physical Therapy

## 2020-06-06 ENCOUNTER — Other Ambulatory Visit: Payer: Self-pay

## 2020-06-06 DIAGNOSIS — R2689 Other abnormalities of gait and mobility: Secondary | ICD-10-CM

## 2020-06-06 DIAGNOSIS — M25571 Pain in right ankle and joints of right foot: Secondary | ICD-10-CM

## 2020-06-06 DIAGNOSIS — M79661 Pain in right lower leg: Secondary | ICD-10-CM

## 2020-06-06 NOTE — Patient Instructions (Signed)

## 2020-06-06 NOTE — Therapy (Signed)
[place], [place], [address] Phone: [phone]   Fax:  [phone]  Physical Therapy Treatment  Patient Details  Name: Emily Lopez MRN: [id number] Date of Birth: 1955-12-04 Referring Provider (PT): Dr. Erlinda Hong, Nancy Fetter, [place]   Encounter Date: 06/06/2020   PT End of Session - 06/06/20 0804    Visit Number 2    Number of Visits 6    Date for PT Re-Evaluation 07/11/20    PT Start Time 0800    PT Stop Time 0838    PT Time Calculation (min) 38 min           Past Medical History:  Diagnosis Date  . Anxiety   . Arthritis   . Attention deficit disorder without mention of hyperactivity   . Depression   . Esophageal reflux   . Hypertension   . Iron deficiency anemia, unspecified   . Myalgia and myositis, unspecified   . Pneumonia    2008 or 2009  . Pre-diabetes   . Undiagnosed cardiac murmurs   . Unspecified vitamin D deficiency     Past Surgical History:  Procedure Laterality Date  . DILATATION & CURETTAGE/HYSTEROSCOPY WITH MYOSURE N/A 07/14/2018   Procedure: DILATATION & CURETTAGE/HYSTEROSCOPY with ultrasound guidance;  Surgeon: Nunzio Cobbs, MD;  Location: [facility];  Service: Gynecology;  Laterality: N/A;  ultrasound guidance needed. Follow previous case.  . NO PAST SURGERIES    . OPERATIVE ULTRASOUND N/A 07/14/2018   Procedure: OPERATIVE ULTRASOUND ultrasound guided hysteroscopy;  Surgeon: Nunzio Cobbs, MD;  Location: [facility];  Service: Gynecology;  Laterality: N/A;  ultrasound guided hysteroscopy/D&C due to cervical stenosis    There were no vitals filed for this visit.   Subjective Assessment - 06/06/20 0811    Subjective No pain yet this morning.    Currently in Pain? No/denies                             [facility] Adult PT Treatment/Exercise - 06/06/20 0001      Modalities   Modalities Iontophoresis       Iontophoresis   Type of Iontophoresis Dexamethasone    Location right achilles    Dose 1.0 ml    Time 4-6 hour slow release patch      Ankle Exercises: Standing   SLS 21 sec best today.    Heel Raises 20 reps    Other Standing Ankle Exercises SLS with 3 way hip finger tip touch      Ankle Exercises: Seated   Other Seated Ankle Exercises Sit-stands x 10 -equal weight bilateral      Ankle Exercises: Aerobic   Stationary Bike Rec Bike L2 x 5 minutes      Ankle Exercises: Stretches   Soleus Stretch 2 reps;30 seconds    Gastroc Stretch 2 reps;30 seconds                  PT Education - 06/06/20 0838    Education Details Ionto precautions and contraindications               PT Long Term Goals - 05/30/20 1810      PT LONG TERM GOAL #1   Title Pt will be able to improve FOTO score to >70% to show functional improvement    Time 6    Period Weeks    Status New  Target Date 07/11/20      PT LONG TERM GOAL #2   Title Pt will be I with HEP for lower leg strength, flexibility and proprioception.    Time 6    Period Weeks    Status New    Target Date 07/11/20      PT LONG TERM GOAL #3   Title Pt will be able to walk a mile without increased pain in Rt LE , ankle    Time 6    Period Weeks    Status New    Target Date 07/11/20      PT LONG TERM GOAL #4   Title Pt will able to stand on Rt LE for 30 sec for min dynamic activity    Time 6    Period Weeks    Status New    Target Date 07/11/20      PT LONG TERM GOAL #5   Title Pt will perform 10 or more heel raises on rt LE to demo increased strength    Time 6    Period Weeks    Status New    Target Date 07/11/20                 Plan - 06/06/20 2458    Clinical Impression Statement Pt arrives with no pain at start of session and reports complaince with HEP. Reviewed her HEP and progressed to squat taps, She deomstrates equal weight bearing during squat. Reviewed education regarding ice and elevation  when she has increased pain or edema. Trial of Dexamethasone via ionto slow release patch performed today. Pt was given handout of precautions and contraindications. Pt verbalized understanding.    PT Next Visit Plan check HEP, bike for warm up. look at squats. Consider Ionto. SLS and calf raise eccentric    PT Home Exercise Plan gastoc/soleus, SLS, heels raises           Patient will benefit from skilled therapeutic intervention in order to improve the following deficits and impairments:  Decreased mobility,Obesity,Pain,Postural dysfunction,Impaired flexibility,Increased fascial restricitons,Decreased strength  Visit Diagnosis: Pain in right lower leg  Pain in right ankle and joints of right foot  Other abnormalities of gait and mobility     Problem List Patient Active Problem List   Diagnosis Date Noted  . Pre-diabetes 04/18/2020  . Acute intractable headache 12/31/2018  . Neck pain 12/31/2018  . 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 Class 1 obesity due to excess calories with serious comorbidity and body mass index (BMI) of 30.0 to 30.9 in adult 05/22/2017  . Right leg swelling 10/21/2016  . VITAMIN D DEFICIENCY 03/13/2009  . DEPRESSIVE DISORDER 08/24/2008  . OVERWEIGHT 07/25/2008  . ATTENTION DEFICIT DISORDER, ADULT 07/25/2008  . SYSTOLIC MURMUR 08/18/2007  . GERD 07/20/2007  . ANEMIA, IRON DEFICIENCY, UNSPEC. 08/07/2006  . Essential hypertension 08/07/2006    Sherrie Mustache , PTA 06/06/2020, 8:49 AM  [facility] [address] [place], [place], [address] Phone: [phone]   Fax:  [phone]  Name: Emily Lopez MRN: [id number] Date of Birth: 11-Feb-1956

## 2020-06-15 ENCOUNTER — Ambulatory Visit: Admitting: Physical Therapy

## 2020-06-22 ENCOUNTER — Other Ambulatory Visit: Payer: Self-pay

## 2020-06-22 ENCOUNTER — Ambulatory Visit (INDEPENDENT_AMBULATORY_CARE_PROVIDER_SITE_OTHER): Admitting: Family Medicine

## 2020-06-22 ENCOUNTER — Ambulatory Visit: Admitting: Physical Therapy

## 2020-06-22 ENCOUNTER — Encounter: Payer: Self-pay | Admitting: Family Medicine

## 2020-06-22 VITALS — BP 124/78 | HR 71 | Temp 98.1°F | Ht 66.0 in | Wt 203.0 lb

## 2020-06-22 DIAGNOSIS — M25472 Effusion, left ankle: Secondary | ICD-10-CM

## 2020-06-22 DIAGNOSIS — E2839 Other primary ovarian failure: Secondary | ICD-10-CM

## 2020-06-22 DIAGNOSIS — E1169 Type 2 diabetes mellitus with other specified complication: Secondary | ICD-10-CM | POA: Diagnosis not present

## 2020-06-22 DIAGNOSIS — I10 Essential (primary) hypertension: Secondary | ICD-10-CM

## 2020-06-22 DIAGNOSIS — I2510 Atherosclerotic heart disease of native coronary artery without angina pectoris: Secondary | ICD-10-CM

## 2020-06-22 MED ORDER — HYDROCHLOROTHIAZIDE 25 MG PO TABS: 25.0000 mg | ORAL_TABLET | Freq: Every day | ORAL | 3 refills | Status: DC

## 2020-06-22 NOTE — Progress Notes (Signed)
1/13/20228:47 AM  Emily Lopez [birth year], 65 y.o., female [id number]  Chief Complaint  Patient presents with  . LEFT ankle swelling     X 1 week notices at end of the day     HPI:   Patient is a 65 y.o. female with past medical history significant for HTN who presents today for left ankle swelling.   Has always had issues with spurs on right ankle Has noticed left ankle has increased swelling throughout the day Denies pain, numbness, trauma or decreased ROM to that ankle Has been trying to increase water intake due to kidney levels  HTN Amlodipine 10 mg Lisinopiril 20 mg Doesn't check BP at home  BP Readings from Last 3 Encounters:  06/22/20 124/78  04/18/20 (!) 153/83  01/19/20 (!) 151/84   Lab Results  Component Value Date   HGBA1C 6.4 (H) 04/18/2020   Lab Results  Component Value Date   CHOL 175 07/05/2019   HDL 72 07/05/2019   LDLCALC 86 07/05/2019   TRIG 95 07/05/2019   CHOLHDL 2.4 07/05/2019   The 10-year ASCVD risk score Mikey Bussing DC Jr., et al., 2013) is: 15%   Values used to calculate the score:     Age: 87 years     Sex: Female     Is Non-Hispanic African American: Yes     Diabetic: Yes     Tobacco smoker: No     Systolic Blood Pressure: [id number] mmHg     Is BP treated: Yes     HDL Cholesterol: 72 mg/dL     Total Cholesterol: 175 mg/dL   Depression screen [facility] 2/9 06/22/2020 04/18/2020 01/19/2020  Decreased Interest 0 1 0  Down, Depressed, Hopeless 0 1 0  PHQ - 2 Score 0 2 0  Altered sleeping - 0 -  Tired, decreased energy - 0 -  Change in appetite - 1 -  Feeling bad or failure about yourself  - 1 -  Trouble concentrating - 0 -  Moving slowly or fidgety/restless - 0 -  Suicidal thoughts - 0 -  PHQ-9 Score - 4 -  Difficult doing work/chores - Not difficult at all -    Fall Risk  06/22/2020 04/18/2020 01/19/2020 07/05/2019 03/29/2019  Falls in the past year? 0 0 0 1 0  Number falls in past yr: 0 0 0 0 0  Injury with Fall? 0 0 0 0 0  Follow up  Falls evaluation completed Falls evaluation completed Falls evaluation completed Falls evaluation completed -     Allergies  Allergen Reactions  . No Known Allergies     Prior to Admission medications   Medication Sig Start Date End Date Taking? Authorizing Provider  amLODipine (NORVASC) 10 MG tablet Take 1 tablet (10 mg total) by mouth daily. 04/18/20  Yes Benjermin Korber, Laurita Quint, FNP  aspirin 81 MG EC tablet Take 1 tablet (81 mg total) by mouth daily. Swallow whole. 09/27/16  Yes Stallings, Zoe A, MD  buPROPion (WELLBUTRIN SR) 150 MG 12 hr tablet TAKE 1 TABLET(150 MG) BY MOUTH DAILY 07/03/19  Yes Stallings, Zoe A, MD  citalopram (CELEXA) 10 MG tablet Take 10 mg by mouth daily. 03/29/19  Yes [provider]  diclofenac (VOLTAREN) 75 MG EC tablet Take 1 tablet (75 mg total) by mouth 2 (two) times daily as needed. 04/13/20  Yes Stanbery, Mary L, PA-C  GARLIC PO Take 1 tablet by mouth.   Yes [provider]  lisinopril (ZESTRIL) 20 MG tablet Take 1  tablet (20 mg total) by mouth daily. 04/18/20  Yes Mykayla Brinton, Laurita Quint, FNP  metoprolol tartrate (LOPRESSOR) 25 MG tablet TAKE 1/2 TABLET(12.5 MG) BY MOUTH DAILY AS NEEDED 04/18/20  Yes Taiven Greenley, Laurita Quint, FNP  mirtazapine (REMERON) 7.5 MG tablet Take 1 tablet (7.5 mg total) by mouth at bedtime. 03/02/19  Yes Forrest Moron, MD  Multiple Vitamin (MULTIVITAMIN) LIQD Take 5 mLs by mouth daily. [place]  Nutri burst Liquid vitamin   Yes [provider]  Prenatal Vit-Fe Fumarate-FA (PRENATAL MULTIVITAMIN) TABS tablet Take 1 tablet by mouth daily at 12 noon.   Yes [provider]  methocarbamol (ROBAXIN) 500 MG tablet Take 1 tablet (500 mg total) by mouth 4 (four) times daily. Patient not taking: Reported on 06/22/2020 01/19/20   Maximiano Coss, NP    Past Medical History:  Diagnosis Date  . Anxiety   . Arthritis   . Attention deficit disorder without mention of hyperactivity   . Depression   . Esophageal reflux   . Hypertension   . Iron  deficiency anemia, unspecified   . Myalgia and myositis, unspecified   . Pneumonia    2008 or 2009  . Pre-diabetes   . Undiagnosed cardiac murmurs   . Unspecified vitamin D deficiency     Past Surgical History:  Procedure Laterality Date  . DILATATION & CURETTAGE/HYSTEROSCOPY WITH MYOSURE N/A 07/14/2018   Procedure: DILATATION & CURETTAGE/HYSTEROSCOPY with ultrasound guidance;  Surgeon: Nunzio Cobbs, MD;  Location: [facility];  Service: Gynecology;  Laterality: N/A;  ultrasound guidance needed. Follow previous case.  . NO PAST SURGERIES    . OPERATIVE ULTRASOUND N/A 07/14/2018   Procedure: OPERATIVE ULTRASOUND ultrasound guided hysteroscopy;  Surgeon: Nunzio Cobbs, MD;  Location: [facility];  Service: Gynecology;  Laterality: N/A;  ultrasound guided hysteroscopy/D&C due to cervical stenosis    Social History   Tobacco Use  . Smoking status: Never Smoker  . Smokeless tobacco: Former Systems developer    Types: Snuff  Substance Use Topics  . Alcohol use: Yes    Alcohol/week: 0.0 standard drinks    Comment: 2 drinks    Family History  Problem Relation Age of Onset  . Hypertension Mother   . Diabetes Mother   . Arthritis Mother   . Hyperlipidemia Mother   . Lung cancer Father   . Heart attack Father   . Diabetes Brother   . Hypertension Sister   . Breast cancer Sister   . Hernia Sister     Review of Systems  Constitutional: Negative for chills, fever and malaise/fatigue.  Eyes: Negative for blurred vision and double vision.  Respiratory: Negative for cough, shortness of breath and wheezing.   Cardiovascular: Positive for leg swelling. Negative for chest pain and palpitations.  Gastrointestinal: Negative for abdominal pain, blood in stool, constipation, diarrhea, heartburn, nausea and vomiting.  Genitourinary: Negative for dysuria, frequency and hematuria.  Musculoskeletal: Negative for back pain and joint pain.  Skin: Negative  for rash.  Neurological: Negative for dizziness, weakness and headaches.     OBJECTIVE:  Today's Vitals   06/22/20 0803 06/22/20 0813  BP: (!) 156/81 124/78  Pulse: 71   Temp: 98.1 F (36.7 C)   SpO2: 100%   Weight: 203 lb (92.1 kg)   Height: 5\' 6"  (1.676 m)    Body mass index is 32.77 kg/m.   Physical Exam Constitutional:      General: She is not in acute distress.  Appearance: Normal appearance. She is not ill-appearing.  HENT:     Head: Normocephalic.  Cardiovascular:     Rate and Rhythm: Normal rate and regular rhythm.     Pulses: Normal pulses.     Heart sounds: Normal heart sounds. No murmur heard. No friction rub. No gallop.   Pulmonary:     Effort: Pulmonary effort is normal. No respiratory distress.     Breath sounds: Normal breath sounds. No stridor. No wheezing, rhonchi or rales.  Abdominal:     General: Bowel sounds are normal.     Palpations: Abdomen is soft.     Tenderness: There is no abdominal tenderness.  Musculoskeletal:     Right lower leg: Edema present.     Left lower leg: Edema present.     Right ankle: Swelling and deformity present.     Left ankle: Swelling present. No deformity. No tenderness. Normal range of motion. Normal pulse.  Skin:    General: Skin is warm and dry.  Neurological:     Mental Status: She is alert and oriented to person, place, and time.  Psychiatric:        Mood and Affect: Mood normal.        Behavior: Behavior normal.     No results found for this or any previous visit (from the past 24 hour(s)).  No results found.   ASSESSMENT and PLAN  Problem List Items Addressed This Visit      Cardiovascular and Mediastinum   Essential hypertension   Relevant Medications   hydrochlorothiazide (HYDRODIURIL) 25 MG tablet     Endocrine   Type 2 diabetes mellitus with other specified complication, unspecified whether long term insulin use (HCC) - Primary    Other Visit Diagnoses    Left ankle swelling        Atherosclerotic cardiovascular disease       Relevant Medications   hydrochlorothiazide (HYDRODIURIL) 25 MG tablet   Other Relevant Orders   Amb ref to Medical Nutrition Therapy-MNT   Estrogen deficiency       Relevant Orders   DG Bone Density       Plan . Declines statin at this time, will revaluate labs next visit . Will start HCTZ, educated on r/se/b . Discussed A1c doesn't want to start medications at this time, will revaluate next visit . Referral placed of Nutritionist . Encouraged compression socks and daily BP checks at home . Declines xray at this time . RTC precautions provided . Encouraged to follow up with ortho for right ankle surgical plans   Return in about 3 months (around 09/20/2020).    Huston Foley Derica Leiber, FNP-BC [facility] [place], [place] [address] Ph.  [phone] Fax [phone]

## 2020-06-22 NOTE — Patient Instructions (Addendum)
Take BP daily, and let me know if greater than 130>80   Hypertension, Adult Hypertension is another name for high blood pressure. High blood pressure forces your heart to work harder to pump blood. This can cause problems over time. There are two numbers in a blood pressure reading. There is a top number (systolic) over a bottom number (diastolic). It is best to have a blood pressure that is below 120/80. Healthy choices can help lower your blood pressure, or you may need medicine to help lower it. What are the causes? The cause of this condition is not known. Some conditions may be related to high blood pressure. What increases the risk?  Smoking.  Having type 2 diabetes mellitus, high cholesterol, or both.  Not getting enough exercise or physical activity.  Being overweight.  Having too much fat, sugar, calories, or salt (sodium) in your diet.  Drinking too much alcohol.  Having long-term (chronic) kidney disease.  Having a family history of high blood pressure.  Age. Risk increases with age.  Race. You may be at higher risk if you are African American.  Gender. Men are at higher risk than women before age 55. After age 41, women are at higher risk than men.  Having obstructive sleep apnea.  Stress. What are the signs or symptoms?  High blood pressure may not cause symptoms. Very high blood pressure (hypertensive crisis) may cause: ? Headache. ? Feelings of worry or nervousness (anxiety). ? Shortness of breath. ? Nosebleed. ? A feeling of being sick to your stomach (nausea). ? Throwing up (vomiting). ? Changes in how you see. ? Very bad chest pain. ? Seizures. How is this treated?  This condition is treated by making healthy lifestyle changes, such as: ? Eating healthy foods. ? Exercising more. ? Drinking less alcohol.  Your health care provider may prescribe medicine if lifestyle changes are not enough to get your blood pressure under control, and if: ? Your  top number is above 130. ? Your bottom number is above 80.  Your personal target blood pressure may vary. Follow these instructions at home: Eating and drinking  If told, follow the DASH eating plan. To follow this plan: ? Fill one half of your plate at each meal with fruits and vegetables. ? Fill one fourth of your plate at each meal with whole grains. Whole grains include whole-wheat pasta, brown rice, and whole-grain bread. ? Eat or drink low-fat dairy products, such as skim milk or low-fat yogurt. ? Fill one fourth of your plate at each meal with low-fat (lean) proteins. Low-fat proteins include fish, chicken without skin, eggs, beans, and tofu. ? Avoid fatty meat, cured and processed meat, or chicken with skin. ? Avoid pre-made or processed food.  Eat less than 1,500 mg of salt each day.  Do not drink alcohol if: ? Your doctor tells you not to drink. ? You are pregnant, may be pregnant, or are planning to become pregnant.  If you drink alcohol: ? Limit how much you use to:  0-1 drink a day for women.  0-2 drinks a day for men. ? Be aware of how much alcohol is in your drink. In the [place] one drink equals one 12 oz bottle of beer (355 mL), one 5 oz glass of wine (148 mL), or one 1 oz glass of hard liquor (44 mL).   Lifestyle  Work with your doctor to stay at a healthy weight or to lose weight. Ask your doctor what the best  weight is for you.  Get at least 30 minutes of exercise most days of the week. This may include walking, swimming, or biking.  Get at least 30 minutes of exercise that strengthens your muscles (resistance exercise) at least 3 days a week. This may include lifting weights or doing Pilates.  Do not use any products that contain nicotine or tobacco, such as cigarettes, e-cigarettes, and chewing tobacco. If you need help quitting, ask your doctor.  Check your blood pressure at home as told by your doctor.  Keep all follow-up visits as told by your doctor.  This is important.   Medicines  Take over-the-counter and prescription medicines only as told by your doctor. Follow directions carefully.  Do not skip doses of blood pressure medicine. The medicine does not work as well if you skip doses. Skipping doses also puts you at risk for problems.  Ask your doctor about side effects or reactions to medicines that you should watch for. Contact a doctor if you:  Think you are having a reaction to the medicine you are taking.  Have headaches that keep coming back (recurring).  Feel dizzy.  Have swelling in your ankles.  Have trouble with your vision. Get help right away if you:  Get a very bad headache.  Start to feel mixed up (confused).  Feel weak or numb.  Feel faint.  Have very bad pain in your: ? Chest. ? Belly (abdomen).  Throw up more than once.  Have trouble breathing. Summary  Hypertension is another name for high blood pressure.  High blood pressure forces your heart to work harder to pump blood.  For most people, a normal blood pressure is less than 120/80.  Making healthy choices can help lower blood pressure. If your blood pressure does not get lower with healthy choices, you may need to take medicine. This information is not intended to replace advice given to you by your health care provider. Make sure you discuss any questions you have with your health care provider. Document Revised: 02/04/2018 Document Reviewed: 02/04/2018 Elsevier Patient Education  2021 [employer].     If you have lab work done today you will be contacted with your lab results within the next 2 weeks.  If you have not heard from [place] then please contact us. The fastest way to get your results is to register for My Chart.   IF you received an x-ray today, you will receive an invoice from [facility]. Please contact [facility] at [phone] with questions or concerns regarding your invoice.   IF you received  labwork today, you will receive an invoice from [place]. Please contact [facility] at [phone] with questions or concerns regarding your invoice.   Our billing staff will not be able to assist you with questions regarding bills from these companies.  You will be contacted with the lab results as soon as they are available. The fastest way to get your results is to activate your My Chart account. Instructions are located on the last page of this paperwork. If you have not heard from [place] regarding the results in 2 weeks, please contact this office.

## 2020-06-27 ENCOUNTER — Encounter (INDEPENDENT_AMBULATORY_CARE_PROVIDER_SITE_OTHER): Payer: Self-pay

## 2020-06-29 ENCOUNTER — Encounter: Payer: Self-pay | Admitting: Physical Therapy

## 2020-06-29 ENCOUNTER — Other Ambulatory Visit: Payer: Self-pay

## 2020-06-29 ENCOUNTER — Ambulatory Visit: Attending: Physician Assistant | Admitting: Physical Therapy

## 2020-06-29 DIAGNOSIS — M25571 Pain in right ankle and joints of right foot: Secondary | ICD-10-CM | POA: Diagnosis present

## 2020-06-29 DIAGNOSIS — R2689 Other abnormalities of gait and mobility: Secondary | ICD-10-CM | POA: Diagnosis present

## 2020-06-29 DIAGNOSIS — M79661 Pain in right lower leg: Secondary | ICD-10-CM | POA: Diagnosis present

## 2020-06-29 NOTE — Therapy (Signed)
[place], [place], [address] Phone: [phone]   Fax:  [phone]  Physical Therapy Treatment  Patient Details  Name: Emily Lopez MRN: [id number] Date of Birth: July 31, 1955 Referring Provider (PT): Dr. Erlinda Hong, Nancy Fetter, [place]   Encounter Date: 06/29/2020   PT End of Session - 06/29/20 0749    Visit Number 3    Number of Visits 6    Date for PT Re-Evaluation 07/11/20    PT Start Time 0747    PT Stop Time 0820    PT Time Calculation (min) 33 min    Activity Tolerance Patient tolerated treatment well    Behavior During Therapy [facility] for tasks assessed/performed           Past Medical History:  Diagnosis Date  . Anxiety   . Arthritis   . Attention deficit disorder without mention of hyperactivity   . Depression   . Esophageal reflux   . Hypertension   . Iron deficiency anemia, unspecified   . Myalgia and myositis, unspecified   . Pneumonia    2008 or 2009  . Pre-diabetes   . Undiagnosed cardiac murmurs   . Unspecified vitamin D deficiency     Past Surgical History:  Procedure Laterality Date  . DILATATION & CURETTAGE/HYSTEROSCOPY WITH MYOSURE N/A 07/14/2018   Procedure: DILATATION & CURETTAGE/HYSTEROSCOPY with ultrasound guidance;  Surgeon: Nunzio Cobbs, MD;  [facility];  Service: Gynecology;  Laterality: N/A;  ultrasound guidance needed. Follow previous case.  . NO PAST SURGERIES    . OPERATIVE ULTRASOUND N/A 07/14/2018   Procedure: OPERATIVE ULTRASOUND ultrasound guided hysteroscopy;  Surgeon: Nunzio Cobbs, MD;  Location: [facility];  Service: Gynecology;  Laterality: N/A;  ultrasound guided hysteroscopy/D&C due to cervical stenosis    There were no vitals filed for this visit.   Subjective Assessment - 06/29/20 0749    Subjective Missed a couple appts. Since I changed my medicine I have not had pain . I have gained some  weight.  The fluid is coming off. I have been doing the exercises at they help.    Currently in Pain? No/denies             [facility] Adult PT Treatment/Exercise - 06/29/20 0001      Iontophoresis   Type of Iontophoresis Dexamethasone    Location Rt lateral ankle    Dose 1.0 ml    Time 4-6 hour slow release patch      Ankle Exercises: Stretches   Soleus Stretch 2 reps;30 seconds    Gastroc Stretch 2 reps;30 seconds      Ankle Exercises: Aerobic   Stationary Bike Rec Bike L2 x 5 minutes      Ankle Exercises: Standing   SLS 30 sec bilateral    Rocker Board 3 minutes    Heel Raises 20 reps    Other Standing Ankle Exercises single leg heel raises x 10 each at countertop    Other Standing Ankle Exercises single leg stnace on foam pad:                       PT Long Term Goals - 06/29/20 0825      PT LONG TERM GOAL #1   Title Pt will be able to improve FOTO score to >70% to show functional improvement    Status On-going      PT LONG TERM GOAL #  2   Title Pt will be I with HEP for lower leg strength, flexibility and proprioception.    Status On-going      PT LONG TERM GOAL #3   Title Pt will be able to walk a mile without increased pain in Rt LE , ankle    Status On-going      PT LONG TERM GOAL #4   Title Pt will able to stand on Rt LE for 30 sec for min dynamic activity    Status On-going      PT LONG TERM GOAL #5   Title Pt will perform 10 or more heel raises on rt LE to demo increased strength    Status On-going                 Plan - 06/29/20 0758    Clinical Impression Statement Pt has not had pain in several days.  She requested end of session after about 30 min .  Static Single leg balance is WNL.  Progressing towards goals but did ask specifically.  She is making an effort ot increase her overall activity as she is trying to lose weight. HEP up to date, would like 1 more visit to finish POC.    PT Treatment/Interventions ADLs/Self Care Home  Management;Cryotherapy;Therapeutic exercise;Patient/family education;Taping;Manual techniques;Dry needling;Therapeutic activities;Moist Heat;Ultrasound;Iontophoresis 4mg /ml Dexamethasone;Neuromuscular re-education    PT Next Visit Plan bike, squats single leg balance. Goals, DC?  Consider Ionto.    PT Home Exercise Plan gastoc/soleus, SLS, heels raises    Consulted and Agree with Plan of Care Patient           Patient will benefit from skilled therapeutic intervention in order to improve the following deficits and impairments:  Decreased mobility,Obesity,Pain,Postural dysfunction,Impaired flexibility,Increased fascial restricitons,Decreased strength  Visit Diagnosis: Pain in right lower leg  Pain in right ankle and joints of right foot  Other abnormalities of gait and mobility     Problem List Patient Active Problem List   Diagnosis Date Noted  . Type 2 diabetes mellitus with other specified complication, unspecified whether long term insulin use ([place]) 06/22/2020  . Pre-diabetes 04/18/2020  . Acute intractable headache 12/31/2018  . Neck pain 12/31/2018  . 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 Class 1 obesity due to excess calories with serious comorbidity and body mass index (BMI) of 30.0 to 30.9 in adult 05/22/2017  . Right leg swelling 10/21/2016  . VITAMIN D DEFICIENCY 03/13/2009  . DEPRESSIVE DISORDER 08/24/2008  . OVERWEIGHT 07/25/2008  . ATTENTION DEFICIT DISORDER, ADULT 07/25/2008  . SYSTOLIC MURMUR 12/87/8676  . GERD 07/20/2007  . ANEMIA, IRON DEFICIENCY, UNSPEC. 08/07/2006  . Essential hypertension 08/07/2006    Shayle Donahoo 06/29/2020, 8:28 AM  [facility] [address] [place], [place], [address] Phone: [phone]   Fax:  [phone]  Name: Emily Lopez MRN: [id number] Date of Birth: 03-Jul-1955  Raeford Razor, PT 06/29/20 8:28 AM Phone: [phone] Fax: [phone]

## 2020-07-06 ENCOUNTER — Other Ambulatory Visit: Payer: Self-pay | Admitting: Family Medicine

## 2020-07-06 DIAGNOSIS — E2839 Other primary ovarian failure: Secondary | ICD-10-CM

## 2020-07-10 ENCOUNTER — Other Ambulatory Visit: Payer: Self-pay

## 2020-07-10 ENCOUNTER — Ambulatory Visit (INDEPENDENT_AMBULATORY_CARE_PROVIDER_SITE_OTHER): Admitting: Bariatrics

## 2020-07-10 ENCOUNTER — Encounter (INDEPENDENT_AMBULATORY_CARE_PROVIDER_SITE_OTHER): Payer: Self-pay | Admitting: Bariatrics

## 2020-07-10 VITALS — BP 145/81 | HR 67 | Temp 98.1°F | Ht 65.0 in | Wt 192.0 lb

## 2020-07-10 DIAGNOSIS — Z1331 Encounter for screening for depression: Secondary | ICD-10-CM

## 2020-07-10 DIAGNOSIS — R0602 Shortness of breath: Secondary | ICD-10-CM | POA: Diagnosis not present

## 2020-07-10 DIAGNOSIS — E669 Obesity, unspecified: Secondary | ICD-10-CM

## 2020-07-10 DIAGNOSIS — E1169 Type 2 diabetes mellitus with other specified complication: Secondary | ICD-10-CM | POA: Diagnosis not present

## 2020-07-10 DIAGNOSIS — I10 Essential (primary) hypertension: Secondary | ICD-10-CM

## 2020-07-10 DIAGNOSIS — F5089 Other specified eating disorder: Secondary | ICD-10-CM

## 2020-07-10 DIAGNOSIS — R5383 Other fatigue: Secondary | ICD-10-CM

## 2020-07-10 DIAGNOSIS — E66811 Obesity, class 1: Secondary | ICD-10-CM

## 2020-07-10 DIAGNOSIS — Z6832 Body mass index (BMI) 32.0-32.9, adult: Secondary | ICD-10-CM

## 2020-07-10 DIAGNOSIS — Z0289 Encounter for other administrative examinations: Secondary | ICD-10-CM

## 2020-07-10 DIAGNOSIS — E559 Vitamin D deficiency, unspecified: Secondary | ICD-10-CM

## 2020-07-10 NOTE — Progress Notes (Signed)
Dear Huston Foley Just, FNP,   Thank you for referring Emily Lopez to our clinic. The following note includes my evaluation and treatment recommendations.  Chief Complaint:   Emily Lopez (MR# [id number]) is a 65 y.o. female who presents for evaluation and treatment of obesity and related comorbidities. Current BMI is Body mass index is 31.95 kg/m. Emily Lopez has been struggling with her weight for many years and has been unsuccessful in either losing weight, maintaining weight loss, or reaching her healthy weight goal.  Emily Lopez is currently in the action stage of change and ready to dedicate time achieving and maintaining a healthier weight. Emily Lopez is interested in becoming our patient and working on intensive lifestyle modifications including (but not limited to) diet and exercise for weight loss.  Emily Lopez likes to [employer].  She craves sweets throughout the day.  She has difficulty with portion sizes.  [place]'s habits were reviewed today and are as follows: Her family eats meals together, she thinks her family will eat healthier with her, her desired weight loss is 43 pounds, she started gaining weight during COVID, her heaviest weight ever was 203 pounds, she craves sweets, she snacks frequently in the evenings, she skips lunch frequently, she frequently makes poor food choices, she has problems with excessive hunger, she frequently eats larger portions than normal and she struggles with emotional eating.  Depression Screen [place]'s Food and Mood (modified PHQ-9) score was 26.  Depression screen [facility] 2/9 07/10/2020  Decreased Interest 3  Down, Depressed, Hopeless 3  PHQ - 2 Score 6  Altered sleeping 3  Tired, decreased energy 3  Change in appetite 3  Feeling bad or failure about yourself  3  Trouble concentrating 3  Moving slowly or fidgety/restless 3  Suicidal thoughts 2  PHQ-9 Score 26  Difficult doing work/chores Very difficult   Subjective:   1. Other fatigue Emily Lopez  admits to daytime somnolence and reports waking up still tired. [place] has a history of symptoms of daytime fatigue, morning fatigue and snoring. [place] generally gets 3 or 4 hours of sleep per night, and states that she has generally restful sleep. Snoring is present. Apneic episodes are present. Epworth Sleepiness Score is 21.  2. SOB (shortness of breath) on exertion Emily Lopez notes increasing shortness of breath with exercising and seems to be worsening over time with weight gain. She notes getting out of breath sooner with activity than she used to. This has gotten worse recently. Emily Lopez denies shortness of breath at rest or orthopnea.  3. Essential hypertension Reasonably well-controlled.  Review: taking medications as instructed, no medication side effects noted, no chest pain on exertion, no dyspnea on exertion, no swelling of ankles.  Taking HCTZ, metoprolol, lisinopril, and amlodipine (4 antihypertensives).    BP Readings from Last 3 Encounters:  07/10/20 (!) 145/81  06/22/20 124/78  04/18/20 (!) 153/83   4. Diabetes mellitus type 2 in obese [facility]) She is on no medications for this.  A1c is 6.4.  Lab Results  Component Value Date   HGBA1C 6.4 (H) 04/18/2020   HGBA1C 6.8 (A) 07/05/2019   HGBA1C 6.2 (H) 12/23/2018   Lab Results  Component Value Date   LDLCALC 86 07/05/2019   CREATININE 1.13 (H) 04/18/2020   5. Vitamin D deficiency Emily Lopez's Vitamin D level was 53.9 on 07/05/2019. She is currently taking vitamin D, C, garlic, and turmeric. She denies nausea, vomiting or muscle weakness.     6. Other disorder of eating PHQ-9 is  43.  7. Depression screening [place] was screened for depression as part of her new patient workup today.  PHQ-9 is 26.  Assessment/Plan:   1. Other fatigue Emily Lopez does feel that her weight is causing her energy to be lower than it should be. Fatigue may be related to obesity, depression or many other causes. Labs will be ordered, and in the meanwhile,  Emily Lopez will focus on self care including making healthy food choices, increasing physical activity and focusing on stress reduction.  Gradually increase activities and will check thyroid panel today.  - EKG 12-Lead - T3 - T4, free - TSH  2. SOB (shortness of breath) on exertion Emily Lopez does feel that she gets out of breath more easily that she used to when she exercises. Emily Lopez's shortness of breath appears to be obesity related and exercise induced. She has agreed to work on weight loss and gradually increase exercise to treat her exercise induced shortness of breath. Will continue to monitor closely.  3. Essential hypertension Emily Lopez is working on healthy weight loss and exercise to improve blood pressure control. We will watch for signs of hypotension as she continues her lifestyle modifications.  Continue medications.  Begin work on diet and exercise.  4. Diabetes mellitus type 2 in obese (HCC) Good blood sugar control is important to decrease the likelihood of diabetic complications such as nephropathy, neuropathy, limb loss, blindness, coronary artery disease, and death. Intensive lifestyle modification including diet, exercise and weight loss are the first line of treatment for diabetes.  Decrease carbohydrates and increase healthy fats and proteins.  - Insulin, random  5. Vitamin D deficiency Will check vitamin D level today, as per below.  - VITAMIN D 25 Hydroxy (Vit-D Deficiency, Fractures)  6. Other disorder of eating Will work on strategies to decrease polyphagia and emotional eating.  7. Depression screening Emily Lopez had a positive depression screening. Depression is commonly associated with obesity and often results in emotional eating behaviors. We will monitor this closely and work on CBT to help improve the non-hunger eating patterns. Referral to Psychology may be required if no improvement is seen as she continues in our clinic.  8. Class 1 obesity with serious comorbidity  and body mass index (BMI) of 32.0 to 32.9 in adult, unspecified obesity type  Emily Lopez is currently in the action stage of change and her goal is to continue with weight loss efforts. I recommend Emily Lopez begin the structured treatment plan as follows:  She has agreed to the Category 2 Plan.  She will work on meal planning, decreasing portion sizes, and intentional eating.  Seasoning sheet was provided today.  Labs from 04/18/2020, including CMP, CBC, and A1c were reviewed with the patient today.  Exercise goals: No exercise has been prescribed at this time.   Behavioral modification strategies: increasing lean protein intake, decreasing simple carbohydrates, increasing vegetables, increasing water intake, decreasing eating out, no skipping meals, meal planning and cooking strategies, keeping healthy foods in the home and planning for success.  She was informed of the importance of frequent follow-up visits to maximize her success with intensive lifestyle modifications for her multiple health conditions. She was informed we would discuss her lab results at her next visit unless there is a critical issue that needs to be addressed sooner. Emily Lopez agreed to keep her next visit at the agreed upon time to discuss these results.  Objective:   Blood pressure (!) 145/81, pulse 67, temperature 98.1 F (36.7 C), height 5\' 5"  (1.651 m), weight  192 lb (87.1 kg), last menstrual period 06/11/2011. Body mass index is 31.95 kg/m.  EKG: Normal sinus rhythm, rate 74 bpm.  Indirect Calorimeter completed today shows a VO2 of 238 and a REE of 1656.  Her calculated basal metabolic rate is 7510 thus her basal metabolic rate is better than expected.  General: Cooperative, alert, well developed, in no acute distress. HEENT: Conjunctivae and lids unremarkable. Cardiovascular: Regular rhythm.  Lungs: Normal work of breathing. Neurologic: No focal deficits.   Lab Results  Component Value Date   CREATININE 1.13 (H)  04/18/2020   BUN 16 04/18/2020   NA 142 04/18/2020   K 4.2 04/18/2020   CL 103 04/18/2020   CO2 25 04/18/2020   Lab Results  Component Value Date   ALT 22 04/18/2020   AST 20 04/18/2020   ALKPHOS 69 04/18/2020   BILITOT 0.5 04/18/2020   Lab Results  Component Value Date   HGBA1C 6.4 (H) 04/18/2020   HGBA1C 6.8 (A) 07/05/2019   HGBA1C 6.2 (H) 12/23/2018   HGBA1C 6.8 (A) 04/27/2018   HGBA1C 6.7 (H) 05/22/2017   Lab Results  Component Value Date   TSH 2.760 07/05/2019   Lab Results  Component Value Date   CHOL 175 07/05/2019   HDL 72 07/05/2019   LDLCALC 86 07/05/2019   TRIG 95 07/05/2019   CHOLHDL 2.4 07/05/2019   Lab Results  Component Value Date   WBC 5.5 04/18/2020   HGB 12.4 04/18/2020   HCT 37.2 04/18/2020   MCV 81 04/18/2020   PLT 327 04/18/2020   Lab Results  Component Value Date   IRON 55 07/20/2007   TIBC 286 07/20/2007   Attestation Statements:   Reviewed by clinician on day of visit: allergies, medications, problem list, medical history, surgical history, family history, social history, and previous encounter notes.  I, Water quality scientist, CMA, am acting as Location manager for [employer], DO  I have reviewed the above documentation for accuracy and completeness, and I agree with the above. Jearld Lesch, DO

## 2020-07-11 ENCOUNTER — Encounter (INDEPENDENT_AMBULATORY_CARE_PROVIDER_SITE_OTHER): Payer: Self-pay | Admitting: Bariatrics

## 2020-07-11 LAB — T3: T3, Total: 100 ng/dL (ref 71–180)

## 2020-07-11 LAB — T4, FREE: Free T4: 1.27 ng/dL (ref 0.82–1.77)

## 2020-07-11 LAB — INSULIN, RANDOM: INSULIN: 21.7 u[IU]/mL (ref 2.6–24.9)

## 2020-07-11 LAB — TSH: TSH: 2.81 u[IU]/mL (ref 0.450–4.500)

## 2020-07-11 LAB — VITAMIN D 25 HYDROXY (VIT D DEFICIENCY, FRACTURES): Vit D, 25-Hydroxy: 41.6 ng/mL (ref 30.0–100.0)

## 2020-07-11 NOTE — Telephone Encounter (Signed)
Last OV with Dr Brown 

## 2020-07-12 ENCOUNTER — Encounter: Payer: Self-pay | Admitting: Dietician

## 2020-07-12 ENCOUNTER — Encounter: Attending: Family Medicine | Admitting: Dietician

## 2020-07-12 ENCOUNTER — Other Ambulatory Visit: Payer: Self-pay

## 2020-07-12 VITALS — Ht 67.0 in | Wt 195.7 lb

## 2020-07-12 DIAGNOSIS — E119 Type 2 diabetes mellitus without complications: Secondary | ICD-10-CM | POA: Insufficient documentation

## 2020-07-12 NOTE — Progress Notes (Signed)
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    Clinical Medical Hx: Diabetes, HTN, CVD Medications: Lisinopril, hydrochlorothiazide, amlodopine, citalopram, lopressor Labs: A1c - 6.4, BP - 145/81 Notable Signs/Symptoms: Fatigue  Lifestyle & Dietary Hx Pt main concern is about their diabetes and wants to stop A1c from rising. Pt reports getting a weight loss dietary plan from [facility] program. Pt reports they classified her as "Class 2". Pt reports history of gestational diabetes about 32 years ago. Pt reports loving cooking and eating big meals. Pt states they have a problem with portion control. Pt reports drinking alcohol in the evenings. Pt states she used to drink brandy but heard it had a lot of calories, so pt switched to vodka.   Estimated daily fluid intake: 128 oz Supplements: Prenatal, Vitamin C, B12 Sleep: no abnormalitites Stress / self-care: stress about health and how to get her blood sugar under control Current average weekly physical activity: ADLs  24-Hr Dietary Recall First Meal: 1 slice [place] bacon, 1 cup greek yogurt Snack: none Second Meal: small side salad, fried chicken leg without skin Snack: none Third Meal: 6 oz grass fed steak Snack: 4 oz vodka Beverages: Water, vodka  Estimated Energy Needs Carbohydrate: 90 g Protein: 60g    NUTRITION DIAGNOSIS  NB-1.1 Food and nutrition-related knowledge deficit As related to diabetes.  As evidenced by A1c of 6.8, history of elevated A1c and prediabetes, dietary recall involving skipping meals, zero carb meals, and self admitted portion control  difficulties.   NUTRITION INTERVENTION  Nutrition education (E-1) on the following topics:  Educated patient on the pathophysiology of diabetes. This includes why our bodies need circulating blood sugar, the relationship between insulin and blood sugar, and the results of insulin resistance and/or pancreatic insufficiency on the development of diabetes. Educated patient on factors that contribute to elevation of blood sugars, such as stress, illness, injury,and food choices. Discussed the role that physical activity plays in lowering blood sugar. Educate patient on the three main macronutrients. Protein, fats, and carbohydrates. Discussed how each of these macronutrients affect blood sugar levels, especially carbohydrate, and the importance of eating a consistent amount of carbohydrate throughout the day. Educated patient on carbohydrate counting, 15g of carbohydrate equals one carb choice.  Educated patient on the balanced plate eating model. Recommended lunch and dinner be 1/2 non-starchy vegetables, 1/4 starches, and 1/4 protein. Recommended breakfast be a balance of starch and protein with a piece of fruit. Discussed with patient the importance of working towards hitting the proportions of the balanced plate consistently. Counseled patient on ways to begin recognizing each of the food groups from the balanced plate in their own meals, and how close they are to fitting the recommended proportions of the balanced plate.    Handouts Provided Include   Diabetes MyPlate  Yellow meal card  Learning Style & Readiness for Change Teaching method utilized: Visual & Auditory  Demonstrated degree of understanding via: Teach Back  Barriers to learning/adherence to lifestyle change: none  Goals Established by Pt  Walk at a moderate pace for 30 minutes, 5 days a week. Consider the Y [place], or the mall during the cold weather.   Work towards eating three meals a  day, about 5-6 hours apart!  Begin to  recognize carbohydrates in your food choices!  Have 2 carb choices at each meal (30 g).   Begin to build your meals using the proportions of the Balanced Plate.  First, select your carb choice(s) for the meal, and determine how much you should have to equal 2 carb choices (45 g).  Next, select your source of protein to pair with your carb choice(s).  Finally, complete the remaining half of your meal with a variety of non-starchy vegetables.   MONITORING & EVALUATION Dietary intake, weekly physical activity in 2 weeks.  Next Steps  Patient is to follow up with dietitian.

## 2020-07-12 NOTE — Patient Instructions (Addendum)
Walk at a moderate pace for 30 minutes, 5 days a week. Consider the Y downtown, or the mall during the cold weather.   Work towards eating three meals a day, about 5-6 hours apart!  Begin to recognize carbohydrates in your food choices!  Have 2 carb choices at each meal (30 g).   Begin to build your meals using the proportions of the Balanced Plate. . First, select your carb choice(s) for the meal, and determine how much you should have to equal 2 carb choices (45 g). . Next, select your source of protein to pair with your carb choice(s). . Finally, complete the remaining half of your meal with a variety of non-starchy vegetables.

## 2020-07-17 ENCOUNTER — Ambulatory Visit (INDEPENDENT_AMBULATORY_CARE_PROVIDER_SITE_OTHER): Admitting: Bariatrics

## 2020-07-21 ENCOUNTER — Other Ambulatory Visit: Payer: Self-pay

## 2020-07-21 ENCOUNTER — Ambulatory Visit: Admitting: Physical Therapy

## 2020-07-24 ENCOUNTER — Encounter (INDEPENDENT_AMBULATORY_CARE_PROVIDER_SITE_OTHER): Payer: Self-pay | Admitting: Bariatrics

## 2020-07-24 ENCOUNTER — Other Ambulatory Visit: Payer: Self-pay

## 2020-07-24 ENCOUNTER — Ambulatory Visit (INDEPENDENT_AMBULATORY_CARE_PROVIDER_SITE_OTHER): Admitting: Bariatrics

## 2020-07-24 VITALS — BP 127/76 | HR 61 | Temp 98.4°F | Ht 67.0 in | Wt 187.0 lb

## 2020-07-24 DIAGNOSIS — E559 Vitamin D deficiency, unspecified: Secondary | ICD-10-CM | POA: Diagnosis not present

## 2020-07-24 DIAGNOSIS — I10 Essential (primary) hypertension: Secondary | ICD-10-CM | POA: Diagnosis not present

## 2020-07-24 DIAGNOSIS — R7303 Prediabetes: Secondary | ICD-10-CM

## 2020-07-24 DIAGNOSIS — Z6830 Body mass index (BMI) 30.0-30.9, adult: Secondary | ICD-10-CM

## 2020-07-24 DIAGNOSIS — E6609 Other obesity due to excess calories: Secondary | ICD-10-CM | POA: Diagnosis not present

## 2020-07-26 ENCOUNTER — Ambulatory Visit: Admitting: Physical Therapy

## 2020-07-26 NOTE — Progress Notes (Signed)
Chief Complaint:   OBESITY Emily Lopez is here to discuss her progress with her obesity treatment plan along with follow-up of her obesity related diagnoses. Emily Lopez is on the Category 2 Plan and states she is following her eating plan approximately 70% of the time. Emily Lopez states she is walking for 30 minutes 5 times per week.  Today's visit was #: 2 Starting weight: 192 lbs Starting date: 07/10/2020 Today's weight: 187 lbs Today's date: 07/24/2020 Total lbs lost to date: 5 lbs Total lbs lost since last in-office visit: 5 lbs  Interim History: Emily Lopez is down 5 pounds.  She says it was "difficult" to stay away from cookies and candy.  Subjective:   1. Pre-diabetes Emily Lopez has a diagnosis of prediabetes based on her elevated HgA1c and was informed this puts her at greater risk of developing diabetes. She continues to work on diet and exercise to decrease her risk of diabetes. She denies nausea or hypoglycemia.  Insulin level 21.7, A1c 6.4.  Lab Results  Component Value Date   HGBA1C 6.4 (H) 04/18/2020   Lab Results  Component Value Date   INSULIN 21.7 07/10/2020   2. Essential hypertension Review: taking medications as instructed, no medication side effects noted, no chest pain on exertion, no dyspnea on exertion, no swelling of ankles.  She is taking Norvasc 10 mg daily, HCTZ 25 mg daily, lisinopril 20 mg daily, and metoprolol 25 mg daily.  BP Readings from Last 3 Encounters:  07/24/20 127/76  07/10/20 (!) 145/81  06/22/20 124/78   3. Vitamin D deficiency Emily Lopez's Vitamin D level was 41.6 on 07/10/2020. She is currently taking OTC vitamin D. She denies nausea, vomiting or muscle weakness.  Assessment/Plan:   1. Pre-diabetes Emily Lopez will continue to work on weight loss, exercise, and decreasing simple carbohydrates to help decrease the risk of diabetes.  Decrease carbohydrates and increase healthy fats and protein.  2. Essential hypertension Emily Lopez is working on healthy weight  loss and exercise to improve blood pressure control. We will watch for signs of hypotension as she continues her lifestyle modifications.  Continue medications.  No added salt in diet.  3. Vitamin D deficiency Low Vitamin D level contributes to fatigue and are associated with obesity, breast, and colon cancer.  Continue vitamin D.  4. Class 1 obesity due to excess calories with serious comorbidity and body mass index (BMI) of 30.0 to 30.9 in adult  Emily Lopez is currently in the action stage of change. As such, her goal is to continue with weight loss efforts. She has agreed to the Category 2 Plan.   She will work on meal planning, intentional eating, and making smart fruit choices.  Labs from 07/10/2020, including vitamin D, thyroid panel, and insulin level, were reviewed today.  Exercise goals: As is.  Behavioral modification strategies: increasing lean protein intake, decreasing simple carbohydrates, increasing vegetables, increasing water intake, decreasing eating out, no skipping meals, meal planning and cooking strategies, keeping healthy foods in the home and planning for success.  Emily Lopez has agreed to follow-up with our clinic in 2 weeks. She was informed of the importance of frequent follow-up visits to maximize her success with intensive lifestyle modifications for her multiple health conditions.   Objective:   Blood pressure 127/76, pulse 61, temperature 98.4 F (36.9 C), height 5\' 7"  (1.702 m), weight 187 lb (84.8 kg), last menstrual period 06/11/2011, SpO2 100 %. Body mass index is 29.29 kg/m.  General: Cooperative, alert, well developed, in no acute distress. HEENT:  Conjunctivae and lids unremarkable. Cardiovascular: Regular rhythm.  Lungs: Normal work of breathing. Neurologic: No focal deficits.   Lab Results  Component Value Date   CREATININE 1.13 (H) 04/18/2020   BUN 16 04/18/2020   NA 142 04/18/2020   K 4.2 04/18/2020   CL 103 04/18/2020   CO2 25 04/18/2020   Lab  Results  Component Value Date   ALT 22 04/18/2020   AST 20 04/18/2020   ALKPHOS 69 04/18/2020   BILITOT 0.5 04/18/2020   Lab Results  Component Value Date   HGBA1C 6.4 (H) 04/18/2020   HGBA1C 6.8 (A) 07/05/2019   HGBA1C 6.2 (H) 12/23/2018   HGBA1C 6.8 (A) 04/27/2018   HGBA1C 6.7 (H) 05/22/2017   Lab Results  Component Value Date   INSULIN 21.7 07/10/2020   Lab Results  Component Value Date   TSH 2.810 07/10/2020   Lab Results  Component Value Date   CHOL 175 07/05/2019   HDL 72 07/05/2019   LDLCALC 86 07/05/2019   TRIG 95 07/05/2019   CHOLHDL 2.4 07/05/2019   Lab Results  Component Value Date   WBC 5.5 04/18/2020   HGB 12.4 04/18/2020   HCT 37.2 04/18/2020   MCV 81 04/18/2020   PLT 327 04/18/2020   Lab Results  Component Value Date   IRON 55 07/20/2007   TIBC 286 07/20/2007   Attestation Statements:   Reviewed by clinician on day of visit: allergies, medications, problem list, medical history, surgical history, family history, social history, and previous encounter notes.  Time spent on visit including pre-visit chart review and post-visit care and charting was 30 minutes.   I, Water quality scientist, CMA, am acting as Location manager for [employer], DO  I have reviewed the above documentation for accuracy and completeness, and I agree with the above. Jearld Lesch, DO

## 2020-07-27 ENCOUNTER — Other Ambulatory Visit: Payer: Self-pay

## 2020-07-27 ENCOUNTER — Ambulatory Visit: Attending: Physician Assistant | Admitting: Physical Therapy

## 2020-07-27 DIAGNOSIS — M79661 Pain in right lower leg: Secondary | ICD-10-CM | POA: Insufficient documentation

## 2020-07-27 DIAGNOSIS — R2689 Other abnormalities of gait and mobility: Secondary | ICD-10-CM | POA: Insufficient documentation

## 2020-07-27 DIAGNOSIS — M25571 Pain in right ankle and joints of right foot: Secondary | ICD-10-CM

## 2020-07-28 ENCOUNTER — Encounter: Payer: Self-pay | Admitting: Physical Therapy

## 2020-07-28 NOTE — Therapy (Signed)
[place], [place], [address] Phone: [phone]   Fax:  [phone]  Physical Therapy Treatment  Patient Details  Name: Emily Lopez MRN: [id number] Date of Birth: 1956/04/11 Referring Provider (PT): Dr. Erlinda Hong, Nancy Fetter, [place]   Encounter Date: 07/27/2020   PT End of Session - 07/27/20 1543    Visit Number 4    Number of Visits 10    Date for PT Re-Evaluation 09/08/20    PT Start Time 8315    PT Stop Time 1627    PT Time Calculation (min) 42 min    Activity Tolerance Patient tolerated treatment well    Behavior During Therapy [facility] for tasks assessed/performed           Past Medical History:  Diagnosis Date  . Anxiety   . Arthritis   . Attention deficit disorder without mention of hyperactivity   . Depression   . Diabetes ([place])   . Edema of both lower extremities   . Esophageal reflux   . High blood sugar   . High cholesterol   . Hypertension   . Iron deficiency anemia, unspecified   . Myalgia and myositis, unspecified   . Pneumonia    2008 or 2009  . Pre-diabetes   . Undiagnosed cardiac murmurs   . Unspecified vitamin D deficiency   . Vitamin B12 deficiency   . Vitamin D deficiency     Past Surgical History:  Procedure Laterality Date  . DILATATION & CURETTAGE/HYSTEROSCOPY WITH MYOSURE N/A 07/14/2018   Procedure: DILATATION & CURETTAGE/HYSTEROSCOPY with ultrasound guidance;  Surgeon: Nunzio Cobbs, MD;  Location: [facility];  Service: Gynecology;  Laterality: N/A;  ultrasound guidance needed. Follow previous case.  . NO PAST SURGERIES    . OPERATIVE ULTRASOUND N/A 07/14/2018   Procedure: OPERATIVE ULTRASOUND ultrasound guided hysteroscopy;  Surgeon: Nunzio Cobbs, MD;  Location: [facility];  Service: Gynecology;  Laterality: N/A;  ultrasound guided hysteroscopy/D&C due to cervical stenosis    There were no vitals filed for this  visit.   Subjective Assessment - 07/27/20 1543    Subjective Patient reports that she continues to have some pain. It is better then it has been. She has been working on her stretches, She notices her right leg turns out. She had a period of time where she had no pain. A few weeks after she stopped therapy her pain started again.    Pertinent History HTN, Haglund's Deformity    Limitations House hold activities;Lifting;Standing;Walking    Patient Stated Goals Pt would like to learn how to help stop the pain .    Currently in Pain? Yes    Pain Score 8     Pain Location Ankle    Pain Orientation Right    Pain Descriptors / Indicators Aching    Pain Type Chronic pain    Pain Onset More than a month ago    Pain Frequency Intermittent    Aggravating Factors  Feels it the most in thte moenign    Pain Relieving Factors her stretches have helped                             [facility] Adult PT Treatment/Exercise - 07/28/20 0001      Ankle Exercises: Aerobic   Stationary Bike Rec Bike L2 x 5 minutes      Ankle Exercises: Standing  SLS 30 sec bilateral    Other Standing Ankle Exercises bilateral weight sahift x20; slow march x15 each leg      Ankle Exercises: Seated   Other Seated Ankle Exercises ankle 3 way red x20 DF, IV and EV                  PT Education - 07/27/20 1546    Education Details reviewed measurements and POC going forward    Person(s) Educated Patient    Methods Explanation;Demonstration;Verbal cues;Tactile cues    Comprehension Verbalized understanding;Returned demonstration;Verbal cues required;Tactile cues required               PT Long Term Goals - 07/28/20 0923      PT LONG TERM GOAL #1   Title Pt will be able to improve FOTO score to >70% to show functional improvement    Time 6    Status On-going    Target Date 09/08/20      PT LONG TERM GOAL #2   Title Pt will be I with HEP for lower leg strength, flexibility and  proprioception.    Time 6    Period Weeks    Status On-going    Target Date 09/08/20      PT LONG TERM GOAL #3   Title Pt will be able to walk a mile without increased pain in Rt LE , ankle    Time 6    Period Weeks    Status On-going    Target Date 09/08/20      PT LONG TERM GOAL #4   Title Pt will able to stand on Rt LE for 30 sec for min dynamic activity    Time 6    Period Weeks    Status On-going      PT LONG TERM GOAL #5   Title Pt will perform 10 or more heel raises on rt LE to demo increased strength    Time 6    Period Weeks    Status On-going    Target Date 09/08/20                 Plan - 07/28/20 0915    Clinical Impression Statement Patient continues to have an area of focal swelling in her achillies insertion area. She had made good progress with therapy but since she stopped the pain has increased. She has a mild limitation in ankle DF. She has mild strength deficits and modderate defecits in single leg stance stability. She would benefit from further skilled therapy to continue to improve stability and decrease inflamation. Therapy reviewed self soft tissue mobilization and TFM to achillies and calf. We also perform manual therapy to the are to improve mobility and decrease inflammation.    Examination-Activity Limitations Squat;Locomotion Level;Stand    Examination-Participation Restrictions Community Activity    Stability/Clinical Decision Making Stable/Uncomplicated    Clinical Decision Making Low    Rehab Potential Excellent    PT Frequency 1x / week    PT Duration 6 weeks    PT Treatment/Interventions ADLs/Self Care Home Management;Cryotherapy;Therapeutic exercise;Patient/family education;Taping;Manual techniques;Dry needling;Therapeutic activities;Moist Heat;Ultrasound;Iontophoresis 4mg /ml Dexamethasone;Neuromuscular re-education    PT Next Visit Plan bike, squats single leg balance. Goals, DC?  Consider Ionto.    PT Home Exercise Plan gastoc/soleus,  SLS, heels raises    Consulted and Agree with Plan of Care Patient           Patient will benefit from skilled therapeutic intervention in order to improve the  following deficits and impairments:  Decreased mobility,Obesity,Pain,Postural dysfunction,Impaired flexibility,Increased fascial restricitons,Decreased strength  Visit Diagnosis: Pain in right lower leg  Pain in right ankle and joints of right foot  Other abnormalities of gait and mobility     Problem List Patient Active Problem List   Diagnosis Date Noted  . Type 2 diabetes mellitus with other specified complication, unspecified whether long term insulin use ([place]) 06/22/2020  . Pre-diabetes 04/18/2020  . Acute intractable headache 12/31/2018  . Neck pain 12/31/2018  . 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 Class 1 obesity due to excess calories with serious comorbidity and body mass index (BMI) of 30.0 to 30.9 in adult 05/22/2017  . Right leg swelling 10/21/2016  . VITAMIN D DEFICIENCY 03/13/2009  . DEPRESSIVE DISORDER 08/24/2008  . OVERWEIGHT 07/25/2008  . ATTENTION DEFICIT DISORDER, ADULT 07/25/2008  . SYSTOLIC MURMUR 90/30/1499  . GERD 07/20/2007  . ANEMIA, IRON DEFICIENCY, UNSPEC. 08/07/2006  . Essential hypertension 08/07/2006    Carney Living PT DPT  07/28/2020, 9:23 AM  [facility] [address] [place], [place], [address] Phone: [phone]   Fax:  [phone]  Name: Emily Lopez MRN: [id number] Date of Birth: 1955/12/10

## 2020-07-30 ENCOUNTER — Encounter (INDEPENDENT_AMBULATORY_CARE_PROVIDER_SITE_OTHER): Payer: Self-pay | Admitting: Bariatrics

## 2020-08-01 ENCOUNTER — Other Ambulatory Visit: Payer: Self-pay

## 2020-08-01 ENCOUNTER — Encounter: Payer: Self-pay | Admitting: Physical Therapy

## 2020-08-01 ENCOUNTER — Ambulatory Visit: Admitting: Physical Therapy

## 2020-08-01 DIAGNOSIS — R2689 Other abnormalities of gait and mobility: Secondary | ICD-10-CM

## 2020-08-01 DIAGNOSIS — M79661 Pain in right lower leg: Secondary | ICD-10-CM | POA: Diagnosis not present

## 2020-08-01 DIAGNOSIS — M25571 Pain in right ankle and joints of right foot: Secondary | ICD-10-CM

## 2020-08-01 NOTE — Therapy (Signed)
[place], [place], [address] Phone: [phone]   Fax:  [phone]  Physical Therapy Treatment  Patient Details  Name: Emily Lopez MRN: [id number] Date of Birth: 30-Jul-1955 Referring Provider (PT): Dr. Erlinda Hong, Nancy Fetter, [place]   Encounter Date: 08/01/2020   PT End of Session - 08/01/20 0758    Visit Number 5    Number of Visits 10    Date for PT Re-Evaluation 09/08/20    PT Start Time 0752    PT Stop Time 0835    PT Time Calculation (min) 43 min    Activity Tolerance Patient tolerated treatment well    Behavior During Therapy [facility] for tasks assessed/performed           Past Medical History:  Diagnosis Date  . Anxiety   . Arthritis   . Attention deficit disorder without mention of hyperactivity   . Depression   . Diabetes ([place])   . Edema of both lower extremities   . Esophageal reflux   . High blood sugar   . High cholesterol   . Hypertension   . Iron deficiency anemia, unspecified   . Myalgia and myositis, unspecified   . Pneumonia    2008 or 2009  . Pre-diabetes   . Undiagnosed cardiac murmurs   . Unspecified vitamin D deficiency   . Vitamin B12 deficiency   . Vitamin D deficiency     Past Surgical History:  Procedure Laterality Date  . DILATATION & CURETTAGE/HYSTEROSCOPY WITH MYOSURE N/A 07/14/2018   Procedure: DILATATION & CURETTAGE/HYSTEROSCOPY with ultrasound guidance;  Surgeon: Nunzio Cobbs, MD;  Location: [facility];  Service: Gynecology;  Laterality: N/A;  ultrasound guidance needed. Follow previous case.  . NO PAST SURGERIES    . OPERATIVE ULTRASOUND N/A 07/14/2018   Procedure: OPERATIVE ULTRASOUND ultrasound guided hysteroscopy;  Surgeon: Nunzio Cobbs, MD;  Location: [facility];  Service: Gynecology;  Laterality: N/A;  ultrasound guided hysteroscopy/D&C due to cervical stenosis    There were no vitals filed for this  visit.   Subjective Assessment - 08/01/20 0756    Subjective Pain goes up to about a 5/10 with some swelling most nights.  Right now, none.  I wonder why the doctor can't just drain the little pocket of fluid.  She has been doing her stretches.    Currently in Pain? No/denies             [facility] Adult PT Treatment/Exercise - 08/01/20 0001      Cryotherapy   Number Minutes Cryotherapy 8 Minutes    Cryotherapy Location Ankle    Type of Cryotherapy Ice pack      Iontophoresis   Type of Iontophoresis Dexamethasone    Location Rt lateral ankle    Dose 1.0 ml    Time 4-6 hour slow release patch      Ankle Exercises: Stretches   Soleus Stretch 3 reps;30 seconds    Slant Board Stretch 3 reps;30 seconds      Ankle Exercises: Aerobic   Stationary Bike Rec Bike L2 x 5 minutes      Ankle Exercises: Standing   SLS 30 sec Rt LE x 3 added LLE motions : hip abduction, circle    Heel Raises Both;20 reps   10 reps toe in, then toe out   Balance Beam tandem stance on foam static and head turns    Other Standing Ankle Exercises hip hinge  on Rt LE on foam pad    Other Standing Ankle Exercises squat on Airex x 15      Ankle Exercises: Seated   Other Seated Ankle Exercises ankle 3 way red x20 DF, IV and EV                  PT Education - 08/01/20 0831    Education Details ice for swelling, HEP reissue and bands, ionto, appt times    Person(s) Educated Patient    Methods Explanation;Demonstration;Handout    Comprehension Verbalized understanding;Returned demonstration;Verbal cues required               PT Long Term Goals - 07/28/20 1610      PT LONG TERM GOAL #1   Title Pt will be able to improve FOTO score to >70% to show functional improvement    Time 6    Status On-going    Target Date 09/08/20      PT LONG TERM GOAL #2   Title Pt will be I with HEP for lower leg strength, flexibility and proprioception.    Time 6    Period Weeks    Status On-going    Target Date  09/08/20      PT LONG TERM GOAL #3   Title Pt will be able to walk a mile without increased pain in Rt LE , ankle    Time 6    Period Weeks    Status On-going    Target Date 09/08/20      PT LONG TERM GOAL #4   Title Pt will able to stand on Rt LE for 30 sec for min dynamic activity    Time 6    Period Weeks    Status On-going      PT LONG TERM GOAL #5   Title Pt will perform 10 or more heel raises on rt LE to demo increased strength    Time 6    Period Weeks    Status On-going    Target Date 09/08/20                 Plan - 08/01/20 0815    Clinical Impression Statement Patient continuesd to have questions about her swelling.  noted edema in lateral aspect of ankle, pain in heel.  Used ionto patch and ice post session for swelling and to show her to use at home.  Pain and swelling worsens throughout the day. Needed increased time for self care and managing her schedule.    PT Treatment/Interventions ADLs/Self Care Home Management;Cryotherapy;Therapeutic exercise;Patient/family education;Taping;Manual techniques;Dry needling;Therapeutic activities;Moist Heat;Ultrasound;Iontophoresis 4mg /ml Dexamethasone;Neuromuscular re-education    PT Home Exercise Plan [id number]           Patient will benefit from skilled therapeutic intervention in order to improve the following deficits and impairments:  Decreased mobility,Obesity,Pain,Postural dysfunction,Impaired flexibility,Increased fascial restricitons,Decreased strength  Visit Diagnosis: Pain in right lower leg  Pain in right ankle and joints of right foot  Other abnormalities of gait and mobility     Problem List Patient Active Problem List   Diagnosis Date Noted  . Type 2 diabetes mellitus with other specified complication, unspecified whether long term insulin use ([place]) 06/22/2020  . Pre-diabetes 04/18/2020  . Acute intractable headache 12/31/2018  . Neck pain 12/31/2018  . 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 Class  1 obesity due to excess calories with serious comorbidity and body mass index (BMI) of 30.0 to 30.9 in adult 05/22/2017  . Right leg  swelling 10/21/2016  . VITAMIN D DEFICIENCY 03/13/2009  . DEPRESSIVE DISORDER 08/24/2008  . OVERWEIGHT 07/25/2008  . ATTENTION DEFICIT DISORDER, ADULT 07/25/2008  . SYSTOLIC MURMUR 83/77/9396  . GERD 07/20/2007  . ANEMIA, IRON DEFICIENCY, UNSPEC. 08/07/2006  . Essential hypertension 08/07/2006    Chaye Misch 08/01/2020, 8:40 AM  [facility] [address] [place], [place], [address] Phone: [phone]   Fax:  [phone]  Name: Emily Lopez MRN: [id number] Date of Birth: 12-Jun-1955   Raeford Razor, PT 08/01/20 8:40 AM Phone: [phone] Fax: [phone]

## 2020-08-01 NOTE — Patient Instructions (Signed)
Access Code: [email or web address]: [email or web address]: 02/22/2022Prepared by: Anderson Malta PaaExercises  Gastroc Stretch on Wall - 2 x daily - 7 x weekly - 1 sets - 5 reps - 30 hold  Soleus Stretch on Wall - 2 x daily - 7 x weekly - 1 sets - 5 reps - 30 hold  Standing Single Leg Stance with Counter Support - 1 x daily - 7 x weekly - 2 sets - 10 reps - 5 hold  Standing Single Leg Heel Raise - 1 x daily - 7 x weekly - 2 sets - 10 reps - 5 hold  Seated Figure 4 Ankle Inversion with Resistance - 2 x daily - 7 x weekly - 2 sets - 10 reps - 5 hold  Seated Ankle Eversion with Resistance - 2 x daily - 7 x weekly - 2 sets - 10 reps - 5 hold

## 2020-08-03 ENCOUNTER — Ambulatory Visit: Admitting: Physical Therapy

## 2020-08-08 ENCOUNTER — Ambulatory Visit: Admitting: Dietician

## 2020-08-11 ENCOUNTER — Other Ambulatory Visit: Payer: Self-pay

## 2020-08-11 ENCOUNTER — Ambulatory Visit: Attending: Physician Assistant

## 2020-08-11 DIAGNOSIS — M25571 Pain in right ankle and joints of right foot: Secondary | ICD-10-CM | POA: Diagnosis not present

## 2020-08-11 DIAGNOSIS — R2689 Other abnormalities of gait and mobility: Secondary | ICD-10-CM | POA: Insufficient documentation

## 2020-08-11 DIAGNOSIS — M79661 Pain in right lower leg: Secondary | ICD-10-CM | POA: Diagnosis present

## 2020-08-11 NOTE — Therapy (Addendum)
[place] [place], [place], [address] Phone: [phone]   Fax:  [phone]  Physical Therapy Treatment/Discharge  Patient Details  Name: Emily Lopez MRN: [id number] Date of Birth: [birth year] Referring Provider (PT): Dr. Erlinda Hong, Nancy Fetter, [place]   Encounter Date: 08/11/2020   PT End of Session - 08/11/20 0739    Visit Number 6    Number of Visits 10    Date for PT Re-Evaluation 09/08/20    PT Start Time 0745    PT Stop Time 0828    PT Time Calculation (min) 43 min    Activity Tolerance Patient tolerated treatment well    Behavior During Therapy [facility] for tasks assessed/performed           Past Medical History:  Diagnosis Date  . Anxiety   . Arthritis   . Attention deficit disorder without mention of hyperactivity   . Depression   . Diabetes ([place])   . Edema of both lower extremities   . Esophageal reflux   . High blood sugar   . High cholesterol   . Hypertension   . Iron deficiency anemia, unspecified   . Myalgia and myositis, unspecified   . Pneumonia    2008 or 2009  . Pre-diabetes   . Undiagnosed cardiac murmurs   . Unspecified vitamin D deficiency   . Vitamin B12 deficiency   . Vitamin D deficiency     Past Surgical History:  Procedure Laterality Date  . DILATATION & CURETTAGE/HYSTEROSCOPY WITH MYOSURE N/A 07/14/2018   Procedure: DILATATION & CURETTAGE/HYSTEROSCOPY with ultrasound guidance;  Surgeon: Nunzio Cobbs, MD;  Location: [facility]  Service: Gynecology;  Laterality: N/A;  ultrasound guidance needed. Follow previous case.  . NO PAST SURGERIES    . OPERATIVE ULTRASOUND N/A 07/14/2018   Procedure: OPERATIVE ULTRASOUND ultrasound guided hysteroscopy;  Surgeon: Nunzio Cobbs, MD;  Location: [facility];  Service: Gynecology;  Laterality: N/A;  ultrasound guided hysteroscopy/D&C due to cervical stenosis    There were no vitals filed for  this visit.   Subjective Assessment - 08/11/20 0745    Subjective 0/10 pain now.   Last pain was 3 days ago. Stretching eased pain and elevated foot that night. she report pain vareis and gnerally is better.  End of day gets swelling in lateral RT ankle. Patch took away pain for a day plus.    Currently in Pain? No/denies    Aggravating Factors  Not sure    Pain Relieving Factors stretches              [facility] PT Assessment - 08/11/20 0001      AROM   Right Ankle Dorsiflexion 10                         [facility] Adult PT Treatment/Exercise - 08/11/20 0001      Iontophoresis   Type of Iontophoresis Dexamethasone    Location Rt lateral ankle    Dose 1.0 ml    Time 4-6 hour slow release patch      Ankle Exercises: Seated   Other Seated Ankle Exercises ankle 3 way green x20 DF, IV and EV      Ankle Exercises: Stretches   Soleus Stretch 3 reps;30 seconds    Slant Board Stretch 3 reps;30 seconds      Ankle Exercises: Standing   SLS 30 sec Rt LE x 3  added LLE motions : hip abduction, circle    Heel Raises Both;20 reps    Balance Beam tandem stance on foam static and head turns    Other Standing Ankle Exercises hip hinge on Rt LE on foam pad    Other Standing Ankle Exercises squat on Airex x 15      Ankle Exercises: Aerobic   Stationary Bike Rec Bike L2 x 7 minutes                       PT Long Term Goals - 07/28/20 1610      PT LONG TERM GOAL #1   Title Pt will be able to improve FOTO score to >70% to show functional improvement    Time 6    Status On-going    Target Date 09/08/20      PT LONG TERM GOAL #2   Title Pt will be I with HEP for lower leg strength, flexibility and proprioception.    Time 6    Period Weeks    Status On-going    Target Date 09/08/20      PT LONG TERM GOAL #3   Title Pt will be able to walk a mile without increased pain in Rt LE , ankle    Time 6    Period Weeks    Status On-going    Target Date 09/08/20       PT LONG TERM GOAL #4   Title Pt will able to stand on Rt LE for 30 sec for min dynamic activity    Time 6    Period Weeks    Status On-going      PT LONG TERM GOAL #5   Title Pt will perform 10 or more heel raises on rt LE to demo increased strength    Time 6    Period Weeks    Status On-going    Target Date 09/08/20                 Plan - 08/11/20 0740    Clinical Impression Statement She continues to question why she is not getting significantly better I suggested she talk to her doctor for an explaination of treatment options and that she may need to be patient.    PT Treatment/Interventions ADLs/Self Care Home Management;Cryotherapy;Therapeutic exercise;Patient/family education;Taping;Manual techniques;Dry needling;Therapeutic activities;Moist Heat;Ultrasound;Iontophoresis 67m/ml Dexamethasone;Neuromuscular re-education    PT Next Visit Plan bike, squats single leg balance. Goals, DC?  Consider Ionto.    PT Home Exercise Plan [email or web address]    Consulted and Agree with Plan of Care Patient           Patient will benefit from skilled therapeutic intervention in order to improve the following deficits and impairments:  Decreased mobility,Obesity,Pain,Postural dysfunction,Impaired flexibility,Increased fascial restricitons,Decreased strength  Visit Diagnosis: Pain in right ankle and joints of right foot  Pain in right lower leg  Other abnormalities of gait and mobility     Problem List Patient Active Problem List   Diagnosis Date Noted  . Type 2 diabetes mellitus with other specified complication, unspecified whether long term insulin use ([place] 06/22/2020  . Pre-diabetes 04/18/2020  . Acute intractable headache 12/31/2018  . Neck pain 12/31/2018  . 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 Class 1 obesity due to excess calories with serious comorbidity and body mass index (BMI) of 30.0 to 30.9 in adult 05/22/2017  . Right leg swelling 10/21/2016  . VITAMIN D DEFICIENCY  03/13/2009  . DEPRESSIVE DISORDER 08/24/2008  .  OVERWEIGHT 07/25/2008  . ATTENTION DEFICIT DISORDER, ADULT 07/25/2008  . SYSTOLIC MURMUR 19/75/8832  . GERD 07/20/2007  . ANEMIA, IRON DEFICIENCY, UNSPEC. 08/07/2006  . Essential hypertension 08/07/2006    Darrel Hoover  PT 08/11/2020, 8:26 AM  [facility] [address] [place], [place], [address] Phone: [phone]   Fax:  [phone]  Name: Emily Lopez MRN: [id number] Date of Birth: 1956/04/27  PHYSICAL THERAPY DISCHARGE SUMMARY  Visits from Start of Care: 6  Current functional level related to goals / functional outcomes: See above for most recent info   Remaining deficits: Pain, flexibility , see above    Education / Equipment: Anatomy, HEP, RICE, POC  Plan: Patient agrees to discharge.  Patient goals were partially met. Patient is being discharged due to the patient's request.  ?????        Raeford Razor, PT 08/24/20 9:06 AM Phone: [phone] Fax: [phone]

## 2020-08-16 ENCOUNTER — Ambulatory Visit (INDEPENDENT_AMBULATORY_CARE_PROVIDER_SITE_OTHER): Admitting: Bariatrics

## 2020-08-16 ENCOUNTER — Encounter (INDEPENDENT_AMBULATORY_CARE_PROVIDER_SITE_OTHER): Payer: Self-pay | Admitting: Bariatrics

## 2020-08-16 ENCOUNTER — Other Ambulatory Visit: Payer: Self-pay

## 2020-08-16 VITALS — BP 127/81 | HR 75 | Temp 98.5°F | Ht 67.0 in | Wt 188.0 lb

## 2020-08-16 DIAGNOSIS — E669 Obesity, unspecified: Secondary | ICD-10-CM

## 2020-08-16 DIAGNOSIS — Z9189 Other specified personal risk factors, not elsewhere classified: Secondary | ICD-10-CM

## 2020-08-16 DIAGNOSIS — I10 Essential (primary) hypertension: Secondary | ICD-10-CM | POA: Diagnosis not present

## 2020-08-16 DIAGNOSIS — Z6830 Body mass index (BMI) 30.0-30.9, adult: Secondary | ICD-10-CM

## 2020-08-16 DIAGNOSIS — E1169 Type 2 diabetes mellitus with other specified complication: Secondary | ICD-10-CM | POA: Diagnosis not present

## 2020-08-16 DIAGNOSIS — K219 Gastro-esophageal reflux disease without esophagitis: Secondary | ICD-10-CM | POA: Diagnosis not present

## 2020-08-16 DIAGNOSIS — E6609 Other obesity due to excess calories: Secondary | ICD-10-CM

## 2020-08-16 MED ORDER — TRULICITY 3 MG/0.5ML ~~LOC~~ SOAJ: 0.7500 mg | SUBCUTANEOUS | 0 refills | Status: DC

## 2020-08-17 ENCOUNTER — Ambulatory Visit: Admitting: Physical Therapy

## 2020-08-17 ENCOUNTER — Encounter: Admitting: Physical Therapy

## 2020-08-17 NOTE — Progress Notes (Signed)
Chief Complaint:   OBESITY Emily Lopez is here to discuss her progress with her obesity treatment plan along with follow-up of her obesity related diagnoses. Emily Lopez is on the Category 2 Plan and states she is following her eating plan approximately 50% of the time. Emily Lopez states she is walking for 15 minutes 3 times per week.  Today's visit was #: 3 Starting weight: 192 lbs Starting date: 07/10/2020 Today's weight: 188 lbs Today's date: 08/16/2020 Total lbs lost to date: 4 Total lbs lost since last in-office visit: 0  Interim History: Emily Lopez is up 1 lb, but she has 2 pair of jeans on. She is drinking adequate water.  Subjective:   1. Gastroesophageal reflux disease, unspecified whether esophagitis present Emily Lopez is not on medications currently.  2. Essential hypertension Emily Lopez's blood pressure is controlled. She is currently on medications.  3. Diabetes mellitus type 2 in obese (Emily Lopez) Emily Lopez's last A1c was 6.4 on 04/28/2020. She denies hypoglycemia.  4. At risk for hypoglycemia Emily Lopez is at increased risk for hypoglycemia due to changes in diet.   Assessment/Plan:   1. Gastroesophageal reflux disease, unspecified whether esophagitis present Intensive lifestyle modifications are the first line treatment for this issue. We discussed several lifestyle modifications today. Emily Lopez will continue to work on diet, exercise and weight loss efforts. She is to avoid trigger foods. Orders and follow up as documented in patient record.   Counseling . If a person has gastroesophageal reflux disease (GERD), food and stomach acid move back up into the esophagus and cause symptoms or problems such as damage to the esophagus. . Anti-reflux measures include: raising the head of the bed, avoiding tight clothing or belts, avoiding eating late at night, not lying down shortly after mealtime, and achieving weight loss. . Avoid ASA, NSAID's, caffeine, alcohol, and tobacco.  . OTC Pepcid and/or Tums are  often very helpful for as needed use.  Marland Kitchen However, for persisting chronic or daily symptoms, stronger medications like Omeprazole may be needed. . You may need to avoid foods and drinks such as: ? Coffee and tea (with or without caffeine). ? Drinks that contain alcohol. ? Energy drinks and sports drinks. ? Bubbly (carbonated) drinks or sodas. ? Chocolate and cocoa. ? Peppermint and mint flavorings. ? Garlic and onions. ? Horseradish. ? Spicy and acidic foods. These include peppers, chili powder, curry powder, vinegar, hot sauces, and BBQ sauce. ? Citrus fruit juices and citrus fruits, such as oranges, lemons, and limes. ? Tomato-based foods. These include red sauce, chili, salsa, and pizza with red sauce. ? Fried and fatty foods. These include donuts, french fries, potato chips, and high-fat dressings. ? High-fat meats. These include hot dogs, rib eye steak, sausage, ham, and bacon.  2. Essential hypertension Emily Lopez will continue her medications, and will continue working on healthy weight loss and exercise to improve blood pressure control. We will watch for signs of hypotension as she continues her lifestyle modifications.  3. Diabetes mellitus type 2 in obese (HCC) Good blood sugar control is important to decrease the likelihood of diabetic complications such as nephropathy, neuropathy, limb loss, blindness, coronary artery disease, and death. Intensive lifestyle modification including diet, exercise and weight loss are the first line of treatment for diabetes. Emily Lopez agreed to start Trulicity 3 mg weekly with no refills, she is to start at 0.75 mg SubQ weekly.  - Dulaglutide (TRULICITY) 3 WC/5.8NI SOPN; Inject 0.75 mg into the skin once a week.  Dispense: 0.5 mL; Refill: 0  4.  At risk for hypoglycemia Emily Lopez was given approximately 15 minutes of counseling today regarding prevention of hypoglycemia. She was advised of symptoms of hypoglycemia. Emily Lopez was instructed to avoid skipping  meals, eat regular protein rich meals and schedule low calorie snacks as needed.   Repetitive spaced learning was employed today to elicit superior memory formation and behavioral change  5. Class 1 obesity due to excess calories with serious comorbidity and body mass index (BMI) of 30.0 to 30.9 in adult Emily Lopez is currently in the action stage of change. As such, her goal is to continue with weight loss efforts. She has agreed to the Category 2 Plan.   Emily Lopez will adhere more closely to the meal plan.  Exercise goals: As is.  Behavioral modification strategies: increasing lean protein intake, decreasing simple carbohydrates, increasing vegetables, increasing water intake, decreasing eating out, no skipping meals, meal planning and cooking strategies, keeping healthy foods in the home and planning for success.  Emily Lopez has agreed to follow-up with our clinic in 2 weeks. She was informed of the importance of frequent follow-up visits to maximize her success with intensive lifestyle modifications for her multiple health conditions.   Objective:   Blood pressure 127/81, pulse 75, temperature 98.5 F (36.9 C), height 5\' 7"  (1.702 m), weight 188 lb (85.3 kg), last menstrual period 06/11/2011, SpO2 100 %. Body mass index is 29.44 kg/m.  General: Cooperative, alert, well developed, in no acute distress. HEENT: Conjunctivae and lids unremarkable. Cardiovascular: Regular rhythm.  Lungs: Normal work of breathing. Neurologic: No focal deficits.   Lab Results  Component Value Date   CREATININE 1.13 (H) 04/18/2020   BUN 16 04/18/2020   NA 142 04/18/2020   K 4.2 04/18/2020   CL 103 04/18/2020   CO2 25 04/18/2020   Lab Results  Component Value Date   ALT 22 04/18/2020   AST 20 04/18/2020   ALKPHOS 69 04/18/2020   BILITOT 0.5 04/18/2020   Lab Results  Component Value Date   HGBA1C 6.4 (H) 04/18/2020   HGBA1C 6.8 (A) 07/05/2019   HGBA1C 6.2 (H) 12/23/2018   HGBA1C 6.8 (A) 04/27/2018    HGBA1C 6.7 (H) 05/22/2017   Lab Results  Component Value Date   INSULIN 21.7 07/10/2020   Lab Results  Component Value Date   TSH 2.810 07/10/2020   Lab Results  Component Value Date   CHOL 175 07/05/2019   HDL 72 07/05/2019   LDLCALC 86 07/05/2019   TRIG 95 07/05/2019   CHOLHDL 2.4 07/05/2019   Lab Results  Component Value Date   WBC 5.5 04/18/2020   HGB 12.4 04/18/2020   HCT 37.2 04/18/2020   MCV 81 04/18/2020   PLT 327 04/18/2020   Lab Results  Component Value Date   IRON 55 07/20/2007   TIBC 286 07/20/2007   Attestation Statements:   Reviewed by clinician on day of visit: allergies, medications, problem list, medical history, surgical history, family history, social history, and previous encounter notes.   Wilhemena Durie, am acting as Location manager for [employer], DO.  I have reviewed the above documentation for accuracy and completeness, and I agree with the above. Jearld Lesch, DO

## 2020-08-21 ENCOUNTER — Encounter (INDEPENDENT_AMBULATORY_CARE_PROVIDER_SITE_OTHER): Payer: Self-pay | Admitting: Bariatrics

## 2020-08-22 ENCOUNTER — Encounter: Payer: Self-pay | Admitting: Physical Therapy

## 2020-08-22 ENCOUNTER — Telehealth: Payer: Self-pay | Admitting: Physical Therapy

## 2020-08-22 ENCOUNTER — Ambulatory Visit: Admitting: Physical Therapy

## 2020-08-22 NOTE — Telephone Encounter (Signed)
Attempted to contact patient regarding no show to Physical therapy appointment. Voicemail full.

## 2020-08-24 ENCOUNTER — Ambulatory Visit: Admitting: Physical Therapy

## 2020-08-28 ENCOUNTER — Ambulatory Visit: Admitting: Physical Therapy

## 2020-08-30 ENCOUNTER — Ambulatory Visit: Admitting: Physical Therapy

## 2020-09-05 ENCOUNTER — Encounter: Admitting: Physical Therapy

## 2020-09-07 ENCOUNTER — Encounter: Admitting: Physical Therapy

## 2020-09-11 ENCOUNTER — Other Ambulatory Visit: Payer: Self-pay

## 2020-09-11 ENCOUNTER — Encounter (INDEPENDENT_AMBULATORY_CARE_PROVIDER_SITE_OTHER): Payer: Self-pay | Admitting: Bariatrics

## 2020-09-11 ENCOUNTER — Other Ambulatory Visit (INDEPENDENT_AMBULATORY_CARE_PROVIDER_SITE_OTHER): Payer: Self-pay

## 2020-09-11 ENCOUNTER — Telehealth (INDEPENDENT_AMBULATORY_CARE_PROVIDER_SITE_OTHER): Admitting: Bariatrics

## 2020-09-11 DIAGNOSIS — E669 Obesity, unspecified: Secondary | ICD-10-CM

## 2020-09-11 DIAGNOSIS — E1169 Type 2 diabetes mellitus with other specified complication: Secondary | ICD-10-CM | POA: Diagnosis not present

## 2020-09-11 DIAGNOSIS — I10 Essential (primary) hypertension: Secondary | ICD-10-CM

## 2020-09-11 DIAGNOSIS — Z6831 Body mass index (BMI) 31.0-31.9, adult: Secondary | ICD-10-CM | POA: Diagnosis not present

## 2020-09-11 MED ORDER — TRULICITY 0.75 MG/0.5ML ~~LOC~~ SOAJ: 0.7500 mg | SUBCUTANEOUS | 0 refills | Status: DC

## 2020-09-12 NOTE — Progress Notes (Signed)
TeleHealth Visit:  Due to the COVID-19 pandemic, this visit was completed with telemedicine (audio/video) technology to reduce patient and provider exposure as well as to preserve personal protective equipment.   Inaara has verbally consented to this TeleHealth visit. The patient is located at home, the provider is located at the [facility] office. The participants in this visit include the listed provider and patient. The visit was conducted today via telephone.  Avalina was unable to use realtime audiovisual technology today and the telehealth visit was conducted via telephone.  Chief Complaint: OBESITY Modean is here to discuss her progress with her obesity treatment plan along with follow-up of her obesity related diagnoses. Karrin is on the Category 2 Plan and states she is following her eating plan approximately 50% of the time. Dareen states she is walking 1 mile 5 times per week.  Today's visit was #: 4 Starting weight: 192 lbs Starting date: 07/10/2020  Interim History: Jeweline thinks that she has lost about 2 pounds.  She is not drinking any ETOH and feels much better.  She is doing well with her water.  Subjective:   1. Type 2 diabetes mellitus with other specified complication, unspecified whether long term insulin use (HCC) Evalisse is taking Trulicity 1.61 mg subcutaneously weekly.  Lab Results  Component Value Date   HGBA1C 6.4 (H) 04/18/2020   HGBA1C 6.8 (A) 07/05/2019   HGBA1C 6.2 (H) 12/23/2018   Lab Results  Component Value Date   LDLCALC 86 07/05/2019   CREATININE 1.13 (H) 04/18/2020   Lab Results  Component Value Date   INSULIN 21.7 07/10/2020   2. Essential hypertension Review: taking medications as instructed, no medication side effects noted, no chest pain on exertion, no dyspnea on exertion, no swelling of ankles.   BP Readings from Last 3 Encounters:  08/16/20 127/81  07/24/20 127/76  07/10/20 (!) 145/81   Assessment/Plan:   1.  Type 2 diabetes mellitus with other specified complication, unspecified whether long term insulin use (HCC) Good blood sugar control is important to decrease the likelihood of diabetic complications such as nephropathy, neuropathy, limb loss, blindness, coronary artery disease, and death. Intensive lifestyle modification including diet, exercise and weight loss are the first line of treatment for diabetes.  Continue medication.  2. Essential hypertension Brissa is working on healthy weight loss and exercise to improve blood pressure control. We will watch for signs of hypotension as she continues her lifestyle modifications.  Continue medication.  No added dietary salt.  3. Obesity, current BMI 29  Rasha is currently in the action stage of change. As such, her goal is to continue with weight loss efforts. She has agreed to the Category 2 Plan.   She will work on adhering closely to the plan and will continue to keep track of her water intake.  Exercise goals: As is.  Walking 1 mile 5 days per week.  Behavioral modification strategies: increasing lean protein intake, decreasing simple carbohydrates, increasing vegetables, increasing water intake, decreasing eating out, no skipping meals, meal planning and cooking strategies, keeping healthy foods in the home and planning for success.  Sahily has agreed to follow-up with our clinic in 2 weeks. She was informed of the importance of frequent follow-up visits to maximize her success with intensive lifestyle modifications for her multiple health conditions.  Objective:   VITALS: Per patient if applicable, see vitals. GENERAL: Alert and in no acute distress. CARDIOPULMONARY: No increased WOB. Speaking in clear sentences.  PSYCH:  Pleasant and cooperative. Speech normal rate and rhythm. Affect is appropriate. Insight and judgement are appropriate. Attention is focused, linear, and appropriate.  NEURO: Oriented as arrived to appointment on time with  no prompting.   Lab Results  Component Value Date   CREATININE 1.13 (H) 04/18/2020   BUN 16 04/18/2020   NA 142 04/18/2020   K 4.2 04/18/2020   CL 103 04/18/2020   CO2 25 04/18/2020   Lab Results  Component Value Date   ALT 22 04/18/2020   AST 20 04/18/2020   ALKPHOS 69 04/18/2020   BILITOT 0.5 04/18/2020   Lab Results  Component Value Date   HGBA1C 6.4 (H) 04/18/2020   HGBA1C 6.8 (A) 07/05/2019   HGBA1C 6.2 (H) 12/23/2018   HGBA1C 6.8 (A) 04/27/2018   HGBA1C 6.7 (H) 05/22/2017   Lab Results  Component Value Date   INSULIN 21.7 07/10/2020   Lab Results  Component Value Date   TSH 2.810 07/10/2020   Lab Results  Component Value Date   CHOL 175 07/05/2019   HDL 72 07/05/2019   LDLCALC 86 07/05/2019   TRIG 95 07/05/2019   CHOLHDL 2.4 07/05/2019   Lab Results  Component Value Date   WBC 5.5 04/18/2020   HGB 12.4 04/18/2020   HCT 37.2 04/18/2020   MCV 81 04/18/2020   PLT 327 04/18/2020   Lab Results  Component Value Date   IRON 55 07/20/2007   TIBC 286 07/20/2007   Attestation Statements:   Reviewed by clinician on day of visit: allergies, medications, problem list, medical history, surgical history, family history, social history, and previous encounter notes.  Time spent on visit including pre-visit chart review and post-visit charting and care was 20 minutes.   I, Water quality scientist, CMA, am acting as Location manager for [employer], DO  I have reviewed the above documentation for accuracy and completeness, and I agree with the above. Jearld Lesch, DO

## 2020-09-13 ENCOUNTER — Encounter (INDEPENDENT_AMBULATORY_CARE_PROVIDER_SITE_OTHER): Payer: Self-pay | Admitting: Bariatrics

## 2020-09-20 ENCOUNTER — Ambulatory Visit: Admitting: Family Medicine

## 2020-09-28 ENCOUNTER — Ambulatory Visit (INDEPENDENT_AMBULATORY_CARE_PROVIDER_SITE_OTHER): Admitting: Bariatrics

## 2020-11-02 ENCOUNTER — Ambulatory Visit: Admitting: Emergency Medicine

## 2020-11-13 ENCOUNTER — Ambulatory Visit (INDEPENDENT_AMBULATORY_CARE_PROVIDER_SITE_OTHER): Admitting: Bariatrics

## 2020-11-14 ENCOUNTER — Ambulatory Visit: Admitting: Internal Medicine

## 2021-01-09 ENCOUNTER — Encounter: Payer: Self-pay | Admitting: Emergency Medicine

## 2021-01-09 ENCOUNTER — Ambulatory Visit (INDEPENDENT_AMBULATORY_CARE_PROVIDER_SITE_OTHER): Admitting: Emergency Medicine

## 2021-01-09 ENCOUNTER — Other Ambulatory Visit: Payer: Self-pay

## 2021-01-09 VITALS — BP 112/60 | HR 75 | Temp 98.2°F | Ht 67.0 in | Wt 192.0 lb

## 2021-01-09 DIAGNOSIS — I10 Essential (primary) hypertension: Secondary | ICD-10-CM

## 2021-01-09 DIAGNOSIS — E1169 Type 2 diabetes mellitus with other specified complication: Secondary | ICD-10-CM

## 2021-01-09 DIAGNOSIS — E663 Overweight: Secondary | ICD-10-CM

## 2021-01-09 DIAGNOSIS — Z7689 Persons encountering health services in other specified circumstances: Secondary | ICD-10-CM | POA: Diagnosis not present

## 2021-01-09 DIAGNOSIS — R7303 Prediabetes: Secondary | ICD-10-CM | POA: Diagnosis not present

## 2021-01-09 NOTE — Assessment & Plan Note (Signed)
Unknown diabetic control at present time Lab Results  Component Value Date   HGBA1C 6.4 (H) 04/18/2020  Blood work done today.

## 2021-01-09 NOTE — Assessment & Plan Note (Signed)
Weight loss approaches discussed with patient. Diet and nutrition discussed.  Metformin recommended. Might be a good candidate for GLP inhibitors.

## 2021-01-09 NOTE — Progress Notes (Signed)
Emily Lopez 65 y.o.   Chief Complaint  Patient presents with   New Patient (Initial Visit)    Establish care    HISTORY OF PRESENT ILLNESS: This is a 65 y.o. female former patient of Dr. Nolon Rod here to establish care with me. States that she wants to lose some weight. Has history of hypertension on lisinopril 10 mg daily, amlodipine 10 mg daily and hydrochlorothiazide 25 mg daily. Has a history of chronic anxiety and depression on Celexa 10 mg daily and Wellbutrin 150 mg daily. History of prediabetes borderline diabetes presently on no medications. No other complaints or medical concerns today.  HPI   Prior to Admission medications   Medication Sig Start Date End Date Taking? Authorizing Provider  amLODipine (NORVASC) 10 MG tablet Take 1 tablet (10 mg total) by mouth daily. 04/18/20  Yes Just, Laurita Quint, FNP  Ascorbic Acid (VITAMIN C) 100 MG tablet Take by mouth daily.   Yes [provider]  buPROPion (WELLBUTRIN SR) 150 MG 12 hr tablet TAKE 1 TABLET(150 MG) BY MOUTH DAILY 07/03/19  Yes Stallings, Zoe A, MD  citalopram (CELEXA) 10 MG tablet Take 10 mg by mouth daily. 03/29/19  Yes [provider]  GARLIC PO Take 1 tablet by mouth.   Yes [provider]  hydrochlorothiazide (HYDRODIURIL) 25 MG tablet Take 1 tablet (25 mg total) by mouth daily. 06/22/20  Yes Just, Laurita Quint, FNP  lisinopril (ZESTRIL) 20 MG tablet Take 1 tablet (20 mg total) by mouth daily. 04/18/20  Yes Just, Laurita Quint, FNP  metoprolol tartrate (LOPRESSOR) 25 MG tablet TAKE 1/2 TABLET(12.5 MG) BY MOUTH DAILY AS NEEDED 04/18/20  Yes Just, Laurita Quint, FNP  Prenatal Vit-Fe Fumarate-FA (PRENATAL MULTIVITAMIN) TABS tablet Take 1 tablet by mouth daily at 12 noon.   Yes [provider]  vitamin B-12 (CYANOCOBALAMIN) 500 MCG tablet Take by mouth daily.   Yes [provider]  diclofenac (VOLTAREN) 75 MG EC tablet Take 1 tablet (75 mg total) by mouth 2 (two) times daily as  needed. Patient not taking: Reported on 01/09/2021 04/13/20   Aundra Dubin, PA-C  Dulaglutide (TRULICITY) [id number] [id number] SOPN Inject 0.75 mg into the skin once a week. 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 08/07/2006   Essential hypertension 08/07/2006    Past Medical History:  Diagnosis Date   Anxiety    Arthritis    Attention deficit disorder without mention of hyperactivity    Depression    Diabetes ([place])    Edema of both lower extremities    Esophageal reflux    High blood sugar    High cholesterol    Hypertension    Iron deficiency anemia, unspecified  Myalgia and myositis, unspecified    Pneumonia    2008 or 2009   Pre-diabetes    Undiagnosed cardiac murmurs    Unspecified vitamin D deficiency    Vitamin B12 deficiency    Vitamin D deficiency     Past Surgical History:  Procedure Laterality Date   DILATATION & CURETTAGE/HYSTEROSCOPY WITH MYOSURE N/A 07/14/2018   Procedure: DILATATION & CURETTAGE/HYSTEROSCOPY with ultrasound guidance;  Surgeon: Nunzio Cobbs, MD;  Location: [facility];  Service: Gynecology;  Laterality: N/A;  ultrasound guidance needed. Follow previous case.   NO  PAST SURGERIES     OPERATIVE ULTRASOUND N/A 07/14/2018   Procedure: OPERATIVE ULTRASOUND ultrasound guided hysteroscopy;  Surgeon: Nunzio Cobbs, MD;  Location: [facility];  Service: Gynecology;  Laterality: N/A;  ultrasound guided hysteroscopy/D&C due to cervical stenosis    Social History   Socioeconomic History   Marital status: Legally Separated    Spouse name: Not on file   Number of children: Not on file   Years of education: Not on file   Highest education level: Not on file  Occupational History   Occupation: Caregiver  Tobacco Use   Smoking status: Never   Smokeless tobacco: Former    Types: Snuff    Quit date: 07/24/2004  Vaping Use   Vaping Use: Never used  Substance and Sexual Activity   Alcohol use: Yes    Alcohol/week: 0.0 standard drinks    Comment: 2 drinks   Drug use: Never   Sexual activity: Not Currently    Birth control/protection: None, Post-menopausal  Other Topics Concern   Not on file  Social History Narrative   Not on file   Social Determinants of Health   Financial Resource Strain: Not on file  Food Insecurity: Not on file  Transportation Needs: Not on file  Physical Activity: Not on file  Stress: Not on file  Social Connections: Not on file  Intimate Partner Violence: Not on file    Family History  Problem Relation Age of Onset   Hypertension Mother    Diabetes Mother    Arthritis Mother    Hyperlipidemia Mother    Kidney disease Mother    Alcoholism Mother    Lung cancer Father    Heart attack Father    Heart disease Father    Alcoholism Father    Hypertension Sister    Breast cancer Sister    Hernia Sister    Diabetes Brother      Review of Systems  Constitutional: Negative.  Negative for chills and fever.  HENT: Negative.  Negative for congestion and sore throat.   Respiratory: Negative.  Negative for cough and shortness of breath.   Cardiovascular: Negative.  Negative for chest pain and  palpitations.  Gastrointestinal: Negative.  Negative for abdominal pain, diarrhea, nausea and vomiting.  Genitourinary: Negative.  Negative for dysuria and hematuria.  Musculoskeletal: Negative.   Skin: Negative.  Negative for rash.  Neurological: Negative.  Negative for dizziness and headaches.  All other systems reviewed and are negative.  Today's Vitals   01/09/21 1603  BP: 112/60  Pulse: 75  Temp: 98.2 F (36.8 C)  TempSrc: Oral  SpO2: 98%  Weight: 192 lb (87.1 kg)  Height: '5\' 7"'$  (1.702 m)   Body mass index is 30.07 kg/m. Wt Readings from Last 3 Encounters:  01/09/21 192 lb (87.1 kg)  08/16/20 188 lb (85.3 kg)  07/24/20 187 lb (84.8 kg)  Physical Exam Vitals reviewed.  Constitutional:      Appearance: Normal appearance.  HENT:     Head: Normocephalic.  Eyes:     Extraocular Movements: Extraocular movements intact.     Pupils: Pupils are equal, round, and reactive to light.  Cardiovascular:     Rate and Rhythm: Normal rate and regular rhythm.     Pulses: Normal pulses.     Heart sounds: Normal heart sounds.  Pulmonary:     Effort: Pulmonary effort is normal.     Breath sounds: Normal breath sounds.  Musculoskeletal:        General: Normal range of motion.     Cervical back: Normal range of motion and neck supple.  Skin:    General: Skin is warm and dry.     Capillary Refill: Capillary refill takes less than 2 seconds.  Neurological:     General: No focal deficit present.     Mental Status: She is alert and oriented to person, place, and time.  Psychiatric:        Mood and Affect: Mood normal.        Behavior: Behavior normal.     ASSESSMENT & PLAN: A total of 30 minutes was spent with the patient and counseling/coordination of care regarding preparing for this visit, review of most recent office visit notes, review of most recent blood work results, review of all medications, weight loss options and possible medications, need for blood work, education  on nutrition, prognosis, documentation and need for follow-up.   Essential hypertension Well-controlled hypertension.  Continue amlodipine 10 mg, lisinopril 20 mg, and hydrochlorothiazide 25 mg daily. Dietary approaches to stop hypertension discussed.  Type 2 diabetes mellitus with other specified complication, unspecified whether long term insulin use (HCC) Unknown diabetic control at present time Lab Results  Component Value Date   HGBA1C 6.4 (H) 04/18/2020  Blood work done today.   Overweight Weight loss approaches discussed with patient. Diet and nutrition discussed.  Metformin recommended. Might be a good candidate for GLP inhibitors.  Adisa was seen today for new patient (initial visit).  Diagnoses and all orders for this visit:  Essential hypertension  Pre-diabetes -     Hemoglobin A1c; Future  Overweight -     CBC with Differential/Platelet; Future -     TSH; Future -     Lipid panel; Future  Encounter to establish care -     Comprehensive metabolic panel; Future  Type 2 diabetes mellitus with other specified complication, unspecified whether long term insulin use [facility])   Patient Instructions  Health Maintenance After Age 87 After age 27, you are at a higher risk for certain long-term diseases and infections as well as injuries from falls. Falls are a major cause of broken bones and head injuries in people who are older than age 71. Getting regular preventive care can help to keep you healthy and well. Preventive care includes getting regular testing and making lifestyle changes as recommended by your health care provider. Talk with your health care provider about: Which screenings and tests you should have. A screening is a test that checks for a disease when you have no symptoms. A diet and exercise plan that is right for you. What should I know about screenings and tests to prevent falls? Screening and testing are the best ways to find a health problem early.  Early diagnosis and treatment give you the best chance of managing medical conditions that are common after age 99. Certain  conditions and lifestyle choices may make you more likely to have a fall. Your health care provider may recommend: Regular vision checks. Poor vision and conditions such as cataracts can make you more likely to have a fall. If you wear glasses, make sure to get your prescription updated if your vision changes. Medicine review. Work with your health care provider to regularly review all of the medicines you are taking, including over-the-counter medicines. Ask your health care provider about any side effects that may make you more likely to have a fall. Tell your health care provider if any medicines that you take make you feel dizzy or sleepy. Osteoporosis screening. Osteoporosis is a condition that causes the bones to get weaker. This can make the bones weak and cause them to break more easily. Blood pressure screening. Blood pressure changes and medicines to control blood pressure can make you feel dizzy. Strength and balance checks. Your health care provider may recommend certain tests to check your strength and balance while standing, walking, or changing positions. Foot health exam. Foot pain and numbness, as well as not wearing proper footwear, can make you more likely to have a fall. Depression screening. You may be more likely to have a fall if you have a fear of falling, feel emotionally low, or feel unable to do activities that you used to do. Alcohol use screening. Using too much alcohol can affect your balance and may make you more likely to have a fall. What actions can I take to lower my risk of falls? General instructions Talk with your health care provider about your risks for falling. Tell your health care provider if: You fall. Be sure to tell your health care provider about all falls, even ones that seem minor. You feel dizzy, sleepy, or off-balance. Take  over-the-counter and prescription medicines only as told by your health care provider. These include any supplements. Eat a healthy diet and maintain a healthy weight. A healthy diet includes low-fat dairy products, low-fat (lean) meats, and fiber from whole grains, beans, and lots of fruits and vegetables. Home safety Remove any tripping hazards, such as rugs, cords, and clutter. Install safety equipment such as grab bars in bathrooms and safety rails on stairs. Keep rooms and walkways well-lit. Activity  Follow a regular exercise program to stay fit. This will help you maintain your balance. Ask your health care provider what types of exercise are appropriate for you. If you need a cane or walker, use it as recommended by your health care provider. Wear supportive shoes that have nonskid soles.  Lifestyle Do not drink alcohol if your health care provider tells you not to drink. If you drink alcohol, limit how much you have: 0-1 drink a day for women. 0-2 drinks a day for men. Be aware of how much alcohol is in your drink. In the [place] one drink equals one typical bottle of beer (12 oz), one-half glass of wine (5 oz), or one shot of hard liquor (1 oz). Do not use any products that contain nicotine or tobacco, such as cigarettes and e-cigarettes. If you need help quitting, ask your health care provider. Summary Having a healthy lifestyle and getting preventive care can help to protect your health and wellness after age 60. Screening and testing are the best way to find a health problem early and help you avoid having a fall. Early diagnosis and treatment give you the best chance for managing medical conditions that are more common for people who are  older than age 10. Falls are a major cause of broken bones and head injuries in people who are older than age 57. Take precautions to prevent a fall at home. Work with your health care provider to learn what changes you can make to improve your  health and wellness and to prevent falls. This information is not intended to replace advice given to you by your health care provider. Make sure you discuss any questions you have with your healthcare provider. Document Revised: 05/12/2020 Document Reviewed: 05/12/2020 Elsevier Patient Education  2022 [place], MD [facility]

## 2021-01-09 NOTE — Assessment & Plan Note (Signed)
Well-controlled hypertension.  Continue amlodipine 10 mg, lisinopril 20 mg, and hydrochlorothiazide 25 mg daily. Dietary approaches to stop hypertension discussed.

## 2021-01-09 NOTE — Patient Instructions (Signed)

## 2021-02-20 ENCOUNTER — Other Ambulatory Visit: Payer: Self-pay | Admitting: Physician Assistant

## 2021-05-16 ENCOUNTER — Other Ambulatory Visit: Payer: Self-pay | Admitting: Emergency Medicine

## 2021-05-16 DIAGNOSIS — I10 Essential (primary) hypertension: Secondary | ICD-10-CM

## 2021-08-15 ENCOUNTER — Other Ambulatory Visit: Payer: Self-pay

## 2021-08-15 ENCOUNTER — Ambulatory Visit (INDEPENDENT_AMBULATORY_CARE_PROVIDER_SITE_OTHER): Payer: Medicare Other | Admitting: Emergency Medicine

## 2021-08-15 ENCOUNTER — Encounter: Payer: Self-pay | Admitting: Emergency Medicine

## 2021-08-15 VITALS — BP 116/70 | HR 80 | Ht 67.0 in | Wt 200.0 lb

## 2021-08-15 DIAGNOSIS — Z1329 Encounter for screening for other suspected endocrine disorder: Secondary | ICD-10-CM

## 2021-08-15 DIAGNOSIS — R7303 Prediabetes: Secondary | ICD-10-CM | POA: Diagnosis not present

## 2021-08-15 DIAGNOSIS — I10 Essential (primary) hypertension: Secondary | ICD-10-CM

## 2021-08-15 DIAGNOSIS — Z13228 Encounter for screening for other metabolic disorders: Secondary | ICD-10-CM

## 2021-08-15 DIAGNOSIS — Z130 Encounter for screening for diseases of the blood and blood-forming organs and certain disorders involving the immune mechanism: Secondary | ICD-10-CM | POA: Diagnosis not present

## 2021-08-15 DIAGNOSIS — Z0001 Encounter for general adult medical examination with abnormal findings: Secondary | ICD-10-CM

## 2021-08-15 DIAGNOSIS — Z13220 Encounter for screening for lipoid disorders: Secondary | ICD-10-CM | POA: Diagnosis not present

## 2021-08-15 DIAGNOSIS — Z1211 Encounter for screening for malignant neoplasm of colon: Secondary | ICD-10-CM

## 2021-08-15 DIAGNOSIS — Z0000 Encounter for general adult medical examination without abnormal findings: Secondary | ICD-10-CM | POA: Diagnosis not present

## 2021-08-15 NOTE — Progress Notes (Signed)
Emily Lopez 66 y.o.   Chief Complaint  Patient presents with   Annual Exam    HISTORY OF PRESENT ILLNESS: This is a 66 y.o. female here for annual exam. Has history of hypertension and would like to stop one of the medications.  Feels she is taking too many.  Concerned about her weight. No other complaints or medical concerns today.  HPI   Prior to Admission medications   Medication Sig Start Date End Date Taking? Authorizing Provider  amLODipine (NORVASC) 10 MG tablet TAKE 1 TABLET(10 MG) BY MOUTH DAILY 05/17/21  Yes Navpreet Szczygiel, Ines Bloomer, MD  Ascorbic Acid (VITAMIN C) 100 MG tablet Take by mouth daily.   Yes [provider]  buPROPion (WELLBUTRIN SR) 150 MG 12 hr tablet TAKE 1 TABLET(150 MG) BY MOUTH DAILY 07/03/19  Yes Stallings, Zoe A, MD  citalopram (CELEXA) 10 MG tablet Take 10 mg by mouth daily. 03/29/19  Yes [provider]  GARLIC PO Take 1 tablet by mouth.   Yes [provider]  hydrochlorothiazide (HYDRODIURIL) 25 MG tablet Take 1 tablet (25 mg total) by mouth daily. 06/22/20  Yes Just, Laurita Quint, FNP  lisinopril (ZESTRIL) 20 MG tablet TAKE 1 TABLET(20 MG) BY MOUTH DAILY 05/17/21  Yes Jayleigh Notarianni, Ines Bloomer, MD  metoprolol tartrate (LOPRESSOR) 25 MG tablet TAKE ONE-HALF TABLET DAILY AS NEEDED 05/17/21  Yes Ivi Griffith, Ines Bloomer, MD  Prenatal Vit-Fe Fumarate-FA (PRENATAL MULTIVITAMIN) TABS tablet Take 1 tablet by mouth daily at 12 noon.   Yes [provider]  vitamin B-12 (CYANOCOBALAMIN) 500 MCG tablet Take by mouth daily.   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 08/07/2006   Essential hypertension 08/07/2006    Past Medical History:  Diagnosis Date   Anxiety    Arthritis    Attention deficit disorder without mention of hyperactivity    Depression    Diabetes ([place])    Edema of both lower extremities    Esophageal reflux    High blood sugar    High cholesterol    Hypertension    Iron deficiency anemia, unspecified    Myalgia and myositis, unspecified    Pneumonia    2008 or 2009   Pre-diabetes    Undiagnosed cardiac murmurs    Unspecified vitamin D deficiency    Vitamin B12 deficiency    Vitamin D deficiency     Past Surgical History:  Procedure Laterality Date   DILATATION & CURETTAGE/HYSTEROSCOPY WITH MYOSURE N/A 07/14/2018   Procedure: DILATATION & CURETTAGE/HYSTEROSCOPY with ultrasound guidance;  Surgeon: Nunzio Cobbs, MD;  Location: [facility];  Service: Gynecology;  Laterality: N/A;  ultrasound guidance needed. Follow previous case.   NO PAST SURGERIES     OPERATIVE ULTRASOUND N/A 07/14/2018   Procedure: OPERATIVE ULTRASOUND ultrasound guided hysteroscopy;  Surgeon: Nunzio Cobbs, MD;  Location: [facility];  Service: Gynecology;  Laterality: N/A;  ultrasound guided hysteroscopy/D&C due to cervical stenosis    Social History   Socioeconomic History   Marital status: Legally Separated    Spouse name: Not on file   Number of children: Not on file   Years of education: Not on file   Highest education level: Not on file  Occupational History   Occupation: Caregiver  Tobacco Use   Smoking status: Never   Smokeless tobacco: Former    Types: Snuff    Quit date: 07/24/2004  Vaping Use   Vaping Use: Never used  Substance  and Sexual Activity   Alcohol use: Yes    Alcohol/week: 0.0 standard drinks    Comment: 2 drinks   Drug use: Never   Sexual activity: Not Currently    Birth control/protection: None, Post-menopausal  Other Topics Concern   Not on file  Social History Narrative   Not on file   Social Determinants of Health   Financial Resource Strain: Not on file  Food Insecurity: Not on file  Transportation Needs: Not on file  Physical Activity: Not on file  Stress: Not on file  Social Connections: Not on file  Intimate Partner Violence: Not on file    Family History  Problem Relation Age of Onset   Hypertension Mother    Diabetes Mother    Arthritis Mother    Hyperlipidemia Mother    Kidney disease Mother    Alcoholism Mother    Lung cancer Father    Heart attack Father    Heart disease Father    Alcoholism Father    Hypertension Sister    Breast cancer Sister    Hernia Sister    Diabetes Brother      Review of Systems  Constitutional: Negative.  Negative for chills and fever.  HENT: Negative.  Negative for congestion and sore throat.   Respiratory: Negative.  Negative for cough and shortness of breath.   Cardiovascular: Negative.  Negative for chest pain and palpitations.  Gastrointestinal: Negative.  Negative for abdominal pain, blood in stool, diarrhea, melena, nausea and vomiting.  Genitourinary: Negative.  Negative for dysuria and hematuria.  Musculoskeletal: Negative.   Skin: Negative.  Negative for rash.  Neurological:  Negative for dizziness and headaches.  All other systems reviewed and are negative.  Today's Vitals   08/15/21 1354  BP: 116/70  Pulse: 80  SpO2: 95%  Weight: 200 lb (90.7 kg)  Height: '5\' 7"'$  (1.702 m)   Body mass index is 31.32 kg/m.  Physical Exam Vitals reviewed.  Constitutional:      Appearance: Normal appearance.  HENT:     Head: Normocephalic.     Mouth/Throat:     Mouth: Mucous membranes are moist.     Pharynx: Oropharynx is clear.   Eyes:     Extraocular Movements: Extraocular movements intact.     Conjunctiva/sclera: Conjunctivae normal.     Pupils: Pupils are equal, round, and reactive to light.  Cardiovascular:     Rate and Rhythm: Normal rate and regular rhythm.     Pulses: Normal pulses.     Heart sounds: Normal heart sounds.  Pulmonary:     Effort: Pulmonary effort is normal.     Breath sounds: Normal breath sounds.  Abdominal:     General: Bowel sounds are normal. There is no distension.     Palpations: Abdomen is soft.     Tenderness: There is no abdominal tenderness.  Musculoskeletal:        General: Normal range of motion.     Cervical back: Neck supple. No tenderness.     Right lower leg: No edema.     Left lower leg: No edema.  Lymphadenopathy:     Cervical: No cervical adenopathy.  Skin:    General: Skin is warm and dry.     Capillary Refill: Capillary refill takes less than 2 seconds.  Neurological:     General: No focal deficit present.     Mental Status: She is alert and oriented to person, place, and time.  Psychiatric:        Mood and Affect: Mood normal.        Behavior: Behavior normal.     ASSESSMENT & PLAN: Problem List Items Addressed This Visit       Cardiovascular and Mediastinum   Essential hypertension   Relevant Orders   Urine Microalbumin w/creat. ratio   Comprehensive metabolic panel     Other   Pre-diabetes   Relevant Orders   Urine Microalbumin w/creat. ratio   Hemoglobin A1c   Other Visit Diagnoses     Routine general medical examination at a health care facility    -  Primary   Colon cancer screening       Relevant Orders   Ambulatory referral to Gastroenterology   Screening for deficiency anemia       Relevant Orders   CBC with Differential   Screening for lipoid disorders       Relevant Orders   Lipid panel   Screening for endocrine, metabolic and immunity disorder       Relevant Orders   Comprehensive metabolic panel   Hemoglobin A1c       Modifiable risk factors discussed with patient. Anticipatory guidance according to age provided. The following topics were also discussed: Social Determinants of Health Smoking.  Non-smoker Diet and nutrition and need to decrease amount of daily carbohydrate intake Benefits of exercise Cancer screening and need for colon cancer screening with colonoscopy Vaccinations recommendations Cardiovascular risk assessment Hypertension management.  We will stop hydrochlorothiazide.  Advised to monitor blood pressure readings at home daily for the next couple weeks and keep a log. Mental health including depression and anxiety Fall and accident prevention  Patient Instructions  Please contact for PAP Smear: [facility] of [place], [place] [address] [phone] Stop Hydrochlorothiazide.  Continue all other medications. Health Maintenance, Female Adopting a healthy lifestyle and getting preventive care are important in promoting health and wellness. Ask your health care provider about: The right schedule for you to have regular tests and exams. Things you can do on your own to prevent diseases and keep yourself healthy. What should I know about diet, weight, and exercise? Eat a healthy diet  Eat a diet that includes plenty of vegetables, fruits, low-fat dairy products, and lean protein. Do not eat a lot of foods that are high in solid fats, added sugars, or sodium. Maintain a healthy weight Body mass index (BMI) is used to identify weight problems. It estimates body fat based on height and weight. Your health care provider can help determine your BMI and help you achieve or maintain a healthy weight. Get regular exercise Get regular exercise. This is one of the most important things you can do for your health. Most adults should: Exercise for at least 150 minutes each week. The exercise should increase your heart rate and make you sweat  (  moderate-intensity exercise). Do strengthening exercises at least twice a week. This is in addition to the moderate-intensity exercise. Spend less time sitting. Even light physical activity can be beneficial. Watch cholesterol and blood lipids Have your blood tested for lipids and cholesterol at 66 years of age, then have this test every 5 years. Have your cholesterol levels checked more often if: Your lipid or cholesterol levels are high. You are older than 66 years of age. You are at high risk for heart disease. What should I know about cancer screening? Depending on your health history and family history, you may need to have cancer screening at various ages. This may include screening for: Breast cancer. Cervical cancer. Colorectal cancer. Skin cancer. Lung cancer. What should I know about heart disease, diabetes, and high blood pressure? Blood pressure and heart disease High blood pressure causes heart disease and increases the risk of stroke. This is more likely to develop in people who have high blood pressure readings or are overweight. Have your blood pressure checked: Every 3-5 years if you are 49-68 years of age. Every year if you are 11 years old or older. Diabetes Have regular diabetes screenings. This checks your fasting blood sugar level. Have the screening done: Once every three years after age 67 if you are at a normal weight and have a low risk for diabetes. More often and at a younger age if you are overweight or have a high risk for diabetes. What should I know about preventing infection? Hepatitis B If you have a higher risk for hepatitis B, you should be screened for this virus. Talk with your health care provider to find out if you are at risk for hepatitis B infection. Hepatitis C Testing is recommended for: Everyone born from [birth year] through 1965. Anyone with known risk factors for hepatitis C. Sexually transmitted infections (STIs) Get screened for STIs,  including gonorrhea and chlamydia, if: You are sexually active and are younger than 66 years of age. You are older than 66 years of age and your health care provider tells you that you are at risk for this type of infection. Your sexual activity has changed since you were last screened, and you are at increased risk for chlamydia or gonorrhea. Ask your health care provider if you are at risk. Ask your health care provider about whether you are at high risk for HIV. Your health care provider may recommend a prescription medicine to help prevent HIV infection. If you choose to take medicine to prevent HIV, you should first get tested for HIV. You should then be tested every 3 months for as long as you are taking the medicine. Pregnancy If you are about to stop having your period (premenopausal) and you may become pregnant, seek counseling before you get pregnant. Take 400 to 800 micrograms (mcg) of folic acid every day if you become pregnant. Ask for birth control (contraception) if you want to prevent pregnancy. Osteoporosis and menopause Osteoporosis is a disease in which the bones lose minerals and strength with aging. This can result in bone fractures. If you are 72 years old or older, or if you are at risk for osteoporosis and fractures, ask your health care provider if you should: Be screened for bone loss. Take a calcium or vitamin D supplement to lower your risk of fractures. Be given hormone replacement therapy (HRT) to treat symptoms of menopause. Follow these instructions at home: Alcohol use Do not drink alcohol if: Your health care provider tells you  not to drink. You are pregnant, may be pregnant, or are planning to become pregnant. If you drink alcohol: Limit how much you have to: 0-1 drink a day. Know how much alcohol is in your drink. In the [place] one drink equals one 12 oz bottle of beer (355 mL), one 5 oz glass of wine (148 mL), or one 1 oz glass of hard liquor (44  mL). Lifestyle Do not use any products that contain nicotine or tobacco. These products include cigarettes, chewing tobacco, and vaping devices, such as e-cigarettes. If you need help quitting, ask your health care provider. Do not use street drugs. Do not share needles. Ask your health care provider for help if you need support or information about quitting drugs. General instructions Schedule regular health, dental, and eye exams. Stay current with your vaccines. Tell your health care provider if: You often feel depressed. You have ever been abused or do not feel safe at home. Summary Adopting a healthy lifestyle and getting preventive care are important in promoting health and wellness. Follow your health care provider's instructions about healthy diet, exercising, and getting tested or screened for diseases. Follow your health care provider's instructions on monitoring your cholesterol and blood pressure. This information is not intended to replace advice given to you by your health care provider. Make sure you discuss any questions you have with your health care provider. Document Revised: 10/16/2020 Document Reviewed: 10/16/2020 Elsevier Patient Education  2022 [place], MD [facility]

## 2021-08-15 NOTE — Patient Instructions (Addendum)
Please contact for PAP Smear: ?[facility] ?[address] ?[place], [place] [address] ?[phone] ?Stop Hydrochlorothiazide.  Continue all other medications. ?Health Maintenance, Female ?Adopting a healthy lifestyle and getting preventive care are important in promoting health and wellness. Ask your health care provider about: ?The right schedule for you to have regular tests and exams. ?Things you can do on your own to prevent diseases and keep yourself healthy. ?What should I know about diet, weight, and exercise? ?Eat a healthy diet ? ?Eat a diet that includes plenty of vegetables, fruits, low-fat dairy products, and lean protein. ?Do not eat a lot of foods that are high in solid fats, added sugars, or sodium. ?Maintain a healthy weight ?Body mass index (BMI) is used to identify weight problems. It estimates body fat based on height and weight. Your health care provider can help determine your BMI and help you achieve or maintain a healthy weight. ?Get regular exercise ?Get regular exercise. This is one of the most important things you can do for your health. Most adults should: ?Exercise for at least 150 minutes each week. The exercise should increase your heart rate and make you sweat (moderate-intensity exercise). ?Do strengthening exercises at least twice a week. This is in addition to the moderate-intensity exercise. ?Spend less time sitting. Even light physical activity can be beneficial. ?Watch cholesterol and blood lipids ?Have your blood tested for lipids and cholesterol at 66 years of age, then have this test every 5 years. ?Have your cholesterol levels checked more often if: ?Your lipid or cholesterol levels are high. ?You are older than 66 years of age. ?You are at high risk for heart disease. ?What should I know about cancer screening? ?Depending on your health history and family history, you may need to have cancer screening at various ages. This may include  screening for: ?Breast cancer. ?Cervical cancer. ?Colorectal cancer. ?Skin cancer. ?Lung cancer. ?What should I know about heart disease, diabetes, and high blood pressure? ?Blood pressure and heart disease ?High blood pressure causes heart disease and increases the risk of stroke. This is more likely to develop in people who have high blood pressure readings or are overweight. ?Have your blood pressure checked: ?Every 3-5 years if you are 48-7 years of age. ?Every year if you are 14 years old or older. ?Diabetes ?Have regular diabetes screenings. This checks your fasting blood sugar level. Have the screening done: ?Once every three years after age 56 if you are at a normal weight and have a low risk for diabetes. ?More often and at a younger age if you are overweight or have a high risk for diabetes. ?What should I know about preventing infection? ?Hepatitis B ?If you have a higher risk for hepatitis B, you should be screened for this virus. Talk with your health care provider to find out if you are at risk for hepatitis B infection. ?Hepatitis C ?Testing is recommended for: ?Everyone born from [birth year] through 1965. ?Anyone with known risk factors for hepatitis C. ?Sexually transmitted infections (STIs) ?Get screened for STIs, including gonorrhea and chlamydia, if: ?You are sexually active and are younger than 66 years of age. ?You are older than 66 years of age and your health care provider tells you that you are at risk for this type of infection. ?Your sexual activity has changed since you were last screened, and you are at increased risk for chlamydia or gonorrhea. Ask your health care provider if you are at risk. ?Ask  your health care provider about whether you are at high risk for HIV. Your health care provider may recommend a prescription medicine to help prevent HIV infection. If you choose to take medicine to prevent HIV, you should first get tested for HIV. You should then be tested every 3 months for as  long as you are taking the medicine. ?Pregnancy ?If you are about to stop having your period (premenopausal) and you may become pregnant, seek counseling before you get pregnant. ?Take 400 to 800 micrograms (mcg) of folic acid every day if you become pregnant. ?Ask for birth control (contraception) if you want to prevent pregnancy. ?Osteoporosis and menopause ?Osteoporosis is a disease in which the bones lose minerals and strength with aging. This can result in bone fractures. If you are 40 years old or older, or if you are at risk for osteoporosis and fractures, ask your health care provider if you should: ?Be screened for bone loss. ?Take a calcium or vitamin D supplement to lower your risk of fractures. ?Be given hormone replacement therapy (HRT) to treat symptoms of menopause. ?Follow these instructions at home: ?Alcohol use ?Do not drink alcohol if: ?Your health care provider tells you not to drink. ?You are pregnant, may be pregnant, or are planning to become pregnant. ?If you drink alcohol: ?Limit how much you have to: ?0-1 drink a day. ?Know how much alcohol is in your drink. In the [place] one drink equals one 12 oz bottle of beer (355 mL), one 5 oz glass of wine (148 mL), or one 1? oz glass of hard liquor (44 mL). ?Lifestyle ?Do not use any products that contain nicotine or tobacco. These products include cigarettes, chewing tobacco, and vaping devices, such as e-cigarettes. If you need help quitting, ask your health care provider. ?Do not use street drugs. ?Do not share needles. ?Ask your health care provider for help if you need support or information about quitting drugs. ?General instructions ?Schedule regular health, dental, and eye exams. ?Stay current with your vaccines. ?Tell your health care provider if: ?You often feel depressed. ?You have ever been abused or do not feel safe at home. ?Summary ?Adopting a healthy lifestyle and getting preventive care are important in promoting health and  wellness. ?Follow your health care provider's instructions about healthy diet, exercising, and getting tested or screened for diseases. ?Follow your health care provider's instructions on monitoring your cholesterol and blood pressure. ?This information is not intended to replace advice given to you by your health care provider. Make sure you discuss any questions you have with your health care provider. ?Document Revised: 10/16/2020 Document Reviewed: 10/16/2020 ?Elsevier Patient Education ? [place]. ? ?

## 2021-08-22 ENCOUNTER — Other Ambulatory Visit (INDEPENDENT_AMBULATORY_CARE_PROVIDER_SITE_OTHER)

## 2021-08-22 ENCOUNTER — Other Ambulatory Visit: Payer: Self-pay

## 2021-08-22 DIAGNOSIS — E663 Overweight: Secondary | ICD-10-CM | POA: Diagnosis not present

## 2021-08-22 LAB — TSH: TSH: 2.14 u[IU]/mL (ref 0.35–5.50)

## 2021-08-31 ENCOUNTER — Telehealth: Payer: Self-pay | Admitting: Pharmacist

## 2021-08-31 NOTE — Telephone Encounter (Signed)
We received a referral from Dr. Aundra Dubin for semagultide. Patient has [employer]. [facility] approval requires failure or contraindication to all other weight loss drugs including generic phentermine, Qsymia, Xenical, and  ?Contrave. ?It has been difficult to get this approved for many patients. ?Ozempic is non-formulary and requires failure of metformin.  ? ?Appears patient was previously on Trulicity.  ? ?I called pt and left message to get more information to see if we can get either one of these options approved.  ? ? ?

## 2021-09-10 ENCOUNTER — Telehealth: Payer: Self-pay | Admitting: Emergency Medicine

## 2021-09-10 ENCOUNTER — Other Ambulatory Visit: Payer: Self-pay | Admitting: Emergency Medicine

## 2021-09-10 DIAGNOSIS — I10 Essential (primary) hypertension: Secondary | ICD-10-CM

## 2021-09-10 NOTE — Telephone Encounter (Signed)
I spoke with patient. She does not have CAD. [employer] would not approve Trulicity. She never started. She needs to have to A1C re-drawn. Will do this at her PCP. ?I will submit PA to insurance for [facility]. I did explain that [employer] is very difficult to get [facility] approved, but we will try.  ?

## 2021-09-10 NOTE — Telephone Encounter (Signed)
Pt states she requested to discontinue the "water pills" but now wants to continue on the medication ? ?Pt states pharmacy will send over a refill request ?

## 2021-09-11 ENCOUNTER — Telehealth: Payer: Self-pay

## 2021-09-11 DIAGNOSIS — I10 Essential (primary) hypertension: Secondary | ICD-10-CM

## 2021-09-11 DIAGNOSIS — E1169 Type 2 diabetes mellitus with other specified complication: Secondary | ICD-10-CM

## 2021-09-11 DIAGNOSIS — Z13220 Encounter for screening for lipoid disorders: Secondary | ICD-10-CM

## 2021-09-11 DIAGNOSIS — Z130 Encounter for screening for diseases of the blood and blood-forming organs and certain disorders involving the immune mechanism: Secondary | ICD-10-CM

## 2021-09-11 DIAGNOSIS — R7303 Prediabetes: Secondary | ICD-10-CM

## 2021-09-11 NOTE — Telephone Encounter (Signed)
Pt is requesting  to have labs drawn that she was suppose to go to on 3/8. The labs would have to entered as future date. The lab advised that they can not see the orders. ? ?Please advise ?

## 2021-09-11 NOTE — Telephone Encounter (Signed)
Okay to reorder labs.  Thanks

## 2021-09-11 NOTE — Telephone Encounter (Signed)
Patient notified lab orders was reordered. Will call back to schedule an lab appt.  ?

## 2021-09-12 ENCOUNTER — Other Ambulatory Visit

## 2021-09-12 DIAGNOSIS — Z130 Encounter for screening for diseases of the blood and blood-forming organs and certain disorders involving the immune mechanism: Secondary | ICD-10-CM

## 2021-09-12 DIAGNOSIS — R7303 Prediabetes: Secondary | ICD-10-CM

## 2021-09-12 DIAGNOSIS — I10 Essential (primary) hypertension: Secondary | ICD-10-CM

## 2021-09-12 DIAGNOSIS — Z13220 Encounter for screening for lipoid disorders: Secondary | ICD-10-CM

## 2021-09-12 LAB — CBC WITH DIFFERENTIAL/PLATELET
Basophils Absolute: 0.1 10*3/uL (ref 0.0–0.1)
Basophils Relative: 1.1 % (ref 0.0–3.0)
Eosinophils Absolute: 0.1 10*3/uL (ref 0.0–0.7)
Eosinophils Relative: 2.1 % (ref 0.0–5.0)
HCT: 34.5 % — ABNORMAL LOW (ref 36.0–46.0)
Hemoglobin: 11.8 g/dL — ABNORMAL LOW (ref 12.0–15.0)
Lymphocytes Relative: 50.9 % — ABNORMAL HIGH (ref 12.0–46.0)
Lymphs Abs: 2.7 10*3/uL (ref 0.7–4.0)
MCHC: 34.3 g/dL (ref 30.0–36.0)
MCV: 82 fl (ref 78.0–100.0)
Monocytes Absolute: 0.4 10*3/uL (ref 0.1–1.0)
Monocytes Relative: 8.1 % (ref 3.0–12.0)
Neutro Abs: 2 10*3/uL (ref 1.4–7.7)
Neutrophils Relative %: 37.8 % — ABNORMAL LOW (ref 43.0–77.0)
Platelets: 284 10*3/uL (ref 150.0–400.0)
RBC: 4.21 Mil/uL (ref 3.87–5.11)
RDW: 16.4 % — ABNORMAL HIGH (ref 11.5–15.5)
WBC: 5.3 10*3/uL (ref 4.0–10.5)

## 2021-09-12 LAB — COMPREHENSIVE METABOLIC PANEL
ALT: 15 U/L (ref 0–35)
AST: 23 U/L (ref 0–37)
Albumin: 4.4 g/dL (ref 3.5–5.2)
Alkaline Phosphatase: 50 U/L (ref 39–117)
BUN: 22 mg/dL (ref 6–23)
CO2: 31 mEq/L (ref 19–32)
Calcium: 9.8 mg/dL (ref 8.4–10.5)
Chloride: 99 mEq/L (ref 96–112)
Creatinine, Ser: 1.27 mg/dL — ABNORMAL HIGH (ref 0.40–1.20)
GFR: 44.31 mL/min — ABNORMAL LOW (ref >60.00)
Glucose, Bld: 108 mg/dL — ABNORMAL HIGH (ref 70–99)
Potassium: 3.2 mEq/L — ABNORMAL LOW (ref 3.5–5.1)
Sodium: 138 mEq/L (ref 135–145)
Total Bilirubin: 0.6 mg/dL (ref 0.2–1.2)
Total Protein: 7.7 g/dL (ref 6.0–8.3)

## 2021-09-12 LAB — MICROALBUMIN / CREATININE URINE RATIO
Creatinine,U: 76.8 mg/dL
Microalb Creat Ratio: 0.9 mg/g (ref 0.0–30.0)
Microalb, Ur: <0.7 mg/dL (ref 0.0–1.9)

## 2021-09-12 LAB — LIPID PANEL
Cholesterol: 171 mg/dL (ref 0–200)
HDL: 74.2 mg/dL (ref >39.00)
LDL Cholesterol: 76 mg/dL (ref 0–99)
NonHDL: 96.73
Total CHOL/HDL Ratio: 2
Triglycerides: 102 mg/dL (ref 0.0–149.0)
VLDL: 20.4 mg/dL (ref 0.0–40.0)

## 2021-09-12 LAB — HEMOGLOBIN A1C: Hgb A1c MFr Bld: 7 % — ABNORMAL HIGH (ref 4.6–6.5)

## 2021-09-13 ENCOUNTER — Encounter: Payer: Self-pay | Admitting: *Deleted

## 2021-09-13 ENCOUNTER — Other Ambulatory Visit: Payer: Self-pay | Admitting: Emergency Medicine

## 2021-09-13 DIAGNOSIS — E1169 Type 2 diabetes mellitus with other specified complication: Secondary | ICD-10-CM

## 2021-09-13 DIAGNOSIS — N1832 Chronic kidney disease, stage 3b: Secondary | ICD-10-CM

## 2021-09-13 DIAGNOSIS — D649 Anemia, unspecified: Secondary | ICD-10-CM

## 2021-09-13 DIAGNOSIS — Z1211 Encounter for screening for malignant neoplasm of colon: Secondary | ICD-10-CM

## 2021-09-13 MED ORDER — EMPAGLIFLOZIN 10 MG PO TABS
10.0000 mg | ORAL_TABLET | Freq: Every day | ORAL | 1 refills | Status: DC
Start: 2021-09-13 — End: 2021-10-10

## 2021-09-13 NOTE — Telephone Encounter (Signed)
Spoke with patient. No personal or family hx of MEN2 of MTC. Has had gallstones when she was a teenager. No hx of pancreatitis. Pt aware there is an increased risk of both of these. Patient is working on increasing her exercise and improving diet. She would like to meet a discuss before purchasing medication. Apt made for 5/3 ?

## 2021-09-13 NOTE — Telephone Encounter (Signed)
Pt is calling to check if Dr. Mitchel Honour will refill for hydrochlorothiazide (HYDRODIURIL) 25 MG . ? ?I advised the pt that Dr. Mitchel Honour is out of the office until Tuesday and Nurse would have to wait on provider being that's its not on her current medication list./ ? ?Please advise ? ?

## 2021-09-13 NOTE — Telephone Encounter (Signed)
See my previous message.  Patient has low potassium and worsening renal failure.  Do not recommend to take hydrochlorothiazide.  Referred to kidney doctor today.

## 2021-09-13 NOTE — Telephone Encounter (Signed)
[place] denied. A1C resulted at 7. Will attempt to see if we can get coverage for Ozempic.  ? ?Ozempic approved. $68 per month at retail, $68 for up to 3 months at [facility]. ?Called pt and LVM for her to call back. ?

## 2021-09-13 NOTE — Telephone Encounter (Signed)
Called patient and left message regarding provider recommendation. Sent message via Mychart as well  ?

## 2021-09-19 ENCOUNTER — Encounter: Payer: Self-pay | Admitting: Emergency Medicine

## 2021-09-24 ENCOUNTER — Telehealth: Payer: Self-pay | Admitting: *Deleted

## 2021-09-24 NOTE — Telephone Encounter (Signed)
Called patient and left message to verify pharmacy for her medication refill request ? ?Amlodipine, Metoprolol, and Lisinopril ? ?  ?

## 2021-10-08 ENCOUNTER — Telehealth: Payer: Self-pay | Admitting: *Deleted

## 2021-10-08 DIAGNOSIS — I10 Essential (primary) hypertension: Secondary | ICD-10-CM

## 2021-10-08 MED ORDER — AMLODIPINE BESYLATE 10 MG PO TABS: ORAL_TABLET | ORAL | 1 refills | Status: DC

## 2021-10-08 MED ORDER — METOPROLOL TARTRATE 25 MG PO TABS: ORAL_TABLET | ORAL | 1 refills | Status: DC

## 2021-10-08 MED ORDER — LISINOPRIL 20 MG PO TABS: ORAL_TABLET | ORAL | 1 refills | Status: DC

## 2021-10-08 NOTE — Telephone Encounter (Signed)
Prescriptions sent to pharmacy on file  

## 2021-10-10 ENCOUNTER — Ambulatory Visit (INDEPENDENT_AMBULATORY_CARE_PROVIDER_SITE_OTHER): Payer: Medicare PPO | Admitting: Pharmacist

## 2021-10-10 ENCOUNTER — Ambulatory Visit: Admitting: Internal Medicine

## 2021-10-10 ENCOUNTER — Encounter: Payer: Self-pay | Admitting: Internal Medicine

## 2021-10-10 VITALS — BP 110/70 | HR 67 | Ht 65.0 in | Wt 189.0 lb

## 2021-10-10 VITALS — Wt 190.0 lb

## 2021-10-10 DIAGNOSIS — E1169 Type 2 diabetes mellitus with other specified complication: Secondary | ICD-10-CM

## 2021-10-10 DIAGNOSIS — E6609 Other obesity due to excess calories: Secondary | ICD-10-CM | POA: Diagnosis not present

## 2021-10-10 DIAGNOSIS — Z6830 Body mass index (BMI) 30.0-30.9, adult: Secondary | ICD-10-CM | POA: Diagnosis not present

## 2021-10-10 DIAGNOSIS — Z8719 Personal history of other diseases of the digestive system: Secondary | ICD-10-CM

## 2021-10-10 DIAGNOSIS — D649 Anemia, unspecified: Secondary | ICD-10-CM

## 2021-10-10 DIAGNOSIS — Z1211 Encounter for screening for malignant neoplasm of colon: Secondary | ICD-10-CM

## 2021-10-10 MED ORDER — OZEMPIC (0.25 OR 0.5 MG/DOSE) 2 MG/3ML ~~LOC~~ SOPN: 0.5000 mg | PEN_INJECTOR | SUBCUTANEOUS | 0 refills | Status: DC

## 2021-10-10 NOTE — Progress Notes (Signed)
? ?Chief Complaint: Anemia ? ?HPI : 66 year old female with history of DM, GERD, IDA presents with anemia and to discuss colon cancer screening ? ?Denies hematochezia. She will occasionally see some black stools when she takes iron supplements. She is post-menopausal. Has not noted any bleeding sources. She has had longstanding anemia. Denies N&V, dysphagia, ab pain, diarrhea, constipation. She has deliberately lost about 10 lbs over the last few months. Denies fam hx of colon cancer. Denies use of blood thinners. Denies NSAIDs. Had a colonoscopy and EGD with [facility] in 2008 that showed some signs of acid reflux. She denies any issue with reflux currently. She is not on any medications for acid reflux currently.  ? ?Wt Readings from Last 3 Encounters:  ?10/10/21 189 lb (85.7 kg)  ?08/15/21 200 lb (90.7 kg)  ?01/09/21 192 lb (87.1 kg)  ? ? ?Past Medical History:  ?Diagnosis Date  ? Anxiety   ? Arthritis   ? Attention deficit disorder without mention of hyperactivity   ? Depression   ? Diabetes ([place])   ? Edema of both lower extremities   ? Esophageal reflux   ? High blood sugar   ? High cholesterol   ? Hypertension   ? Iron deficiency anemia, unspecified   ? Myalgia and myositis, unspecified   ? Pneumonia   ? 2008 or 2009  ? Pre-diabetes   ? Undiagnosed cardiac murmurs   ? Vitamin B12 deficiency   ? Vitamin D deficiency   ? ? ? ?Past Surgical History:  ?Procedure Laterality Date  ? DILATATION & CURETTAGE/HYSTEROSCOPY WITH MYOSURE N/A 07/14/2018  ? Procedure: DILATATION & CURETTAGE/HYSTEROSCOPY with ultrasound guidance;  Surgeon: Nunzio Cobbs, MD;  Location: [facility];  Service: Gynecology;  Laterality: N/A;  ultrasound guidance needed. Follow previous case.  ? OPERATIVE ULTRASOUND N/A 07/14/2018  ? Procedure: OPERATIVE ULTRASOUND ultrasound guided hysteroscopy;  Surgeon: Nunzio Cobbs, MD;  Location: [facility];  Service: Gynecology;  Laterality: N/A;   ultrasound guided hysteroscopy/D&C due to cervical stenosis  ? ?Family History  ?Problem Relation Age of Onset  ? Hypertension Mother   ? Diabetes Mother   ? Arthritis Mother   ? Hyperlipidemia Mother   ? Kidney disease Mother   ? Alcoholism Mother   ? Lung cancer Father   ? Heart attack Father   ? Heart disease Father   ? Alcoholism Father   ? Hypertension Sister   ? Breast cancer Sister   ? Hernia Sister   ? Diabetes Brother   ? Colon cancer Neg Hx   ? Esophageal cancer Neg Hx   ? ?Social History  ? ?Tobacco Use  ? Smoking status: Never  ? Smokeless tobacco: Former  ?  Types: Snuff  ?  Quit date: 07/24/2004  ?Vaping Use  ? Vaping Use: Never used  ?Substance Use Topics  ? Alcohol use: Not Currently  ?  Comment: 2 drinks  ? Drug use: Never  ? ?Current Outpatient Medications  ?Medication Sig Dispense Refill  ? amLODipine (NORVASC) 10 MG tablet TAKE 1 TABLET(10 MG) BY MOUTH DAILY 90 tablet 1  ? lisinopril (ZESTRIL) 20 MG tablet TAKE 1 TABLET(20 MG) BY MOUTH DAILY 90 tablet 1  ? metoprolol tartrate (LOPRESSOR) 25 MG tablet TAKE ONE-HALF TABLET DAILY AS NEEDED (Patient taking differently: TAKE ONE-HALF TABLET DAILY) 90 tablet 1  ? ?No current facility-administered medications for this visit.  ? ?Allergies  ?Allergen Reactions  ? No Known Allergies   ? ? ? ?  Review of Systems: ?All systems reviewed and negative except where noted in HPI.  ? ?Physical Exam: ?BP 110/70   Pulse 67   Ht '5\' 5"'$  (1.651 m)   Wt 189 lb (85.7 kg)   LMP 06/11/2011 (Within Years)   BMI 31.45 kg/m?  ?Constitutional: Pleasant,well-developed, female in no acute distress. ?HEENT: Normocephalic and atraumatic. Conjunctivae are normal. No scleral icterus. ?Cardiovascular: Normal rate, regular rhythm.  ?Pulmonary/chest: Effort normal and breath sounds normal. No wheezing, rales or rhonchi. ?Abdominal: Soft, nondistended, nontender. Bowel sounds active throughout. There are no masses palpable. No hepatomegaly. ?Extremities: No edema ?Neurological: Alert  and oriented to person place and time. ?Skin: Skin is warm and dry. No rashes noted. ?Psychiatric: Normal mood and affect. Behavior is normal. ? ?Labs 09/2021: CBC with low Hb of 11.8, MCV 82. CMP with low potassium of 3.2 and elevated Cr of 1.27. ? ?Abd U/S 12/17/13: ?IMPRESSION:  ?Bilateral simple renal cysts are noted. Probable fatty infiltration  ?of the liver.  ? ?ASSESSMENT AND PLAN: ?Anemia ?Colon cancer screening ?History of GERD ?Patient presents with recurrent anemia for many years. Unclear whether or not this is true iron deficiency anemia so will plan to check her iron levels. Since patient has history of GERD with endoscopic evidence of reflux on EGD in the past, will plan to repeat an EGD for further evaluation. Will also plan for a colonoscopy for colon cancer screening. ?- Check ferritin, iron/TIBC ?- EGD/colonoscopy [facility]. Plan for Sutab. ? ?Christia Reading, MD ? ?

## 2021-10-10 NOTE — Patient Instructions (Addendum)
Ozempic Counseling Points ?This medication reduces your appetite and may make you feel fuller longer.  ?Stop eating when your body tells you that you are full. This will likely happen sooner than you are used to. ?Store your medication in the fridge until you are ready to use it. ?Inject your medication in the fatty tissue of your lower abdominal area (2 inches away from belly button) or upper outer thigh. Rotate injection sites. ?Each pen will last you about 1 month (the first month it will last a few weeks longer). Use a different needle with each weekly injection. ?Common side effects include: nausea, diarrhea/constipation, and heartburn, and are more likely to occur if you overeat. ? ?Dosing schedule: ?- Month 1: Inject Ozempic 0.'25mg'$  subcutaneously once weekly for 4 weeks ?- Month 2: Inject Ozepmic 0.'5mg'$  subcutaneously once weekly  ? ?Tips for living a healthier life ? ? ? ? ?Building a Naval architect Diet ?Make most of your meal vegetables and fruits - ? of your plate. ?Aim for color and variety, and remember that potatoes don?t count as vegetables on the Healthy Eating Plate because of their negative impact on blood sugar. ? ?Go for whole grains - ? of your plate. ?Whole and intact grains--whole wheat, barley, wheat berries, quinoa, oats, brown rice, and foods made with them, such as whole wheat pasta--have a milder effect on blood sugar and insulin than white bread, white rice, and other refined grains. ? ?Protein power - ? of your plate. ?Fish, poultry, beans, and nuts are all healthy, versatile protein sources--they can be mixed into salads, and pair well with vegetables on a plate. Limit red meat, and avoid processed meats such as bacon and sausage. ? ?Healthy plant oils - in moderation. ?Choose healthy vegetable oils like olive, canola, soy, corn, sunflower, peanut, and others, and avoid partially hydrogenated oils, which contain unhealthy trans fats. Remember that low-fat does not mean  ?healthy.? ? ?Drink water, coffee, or tea. ?Skip sugary drinks, limit milk and dairy products to one to two servings per day, and limit juice to a small glass per day. ? ?Stay active. ?The red figure running across the [place] is a reminder that staying active is also important in weight control. ? ?The main message of the Healthy Eating Plate is to focus on diet quality: ? ?The type of carbohydrate in the diet is more important than the amount of carbohydrate in the diet, because some sources of carbohydrate--like vegetables (other than potatoes), fruits, whole grains, and beans--are healthier than others. ?The Healthy Eating Plate also advises consumers to avoid sugary beverages, a major source of calories--usually with little nutritional value--in the American diet. ?The Healthy Eating Plate encourages consumers to use healthy oils, and it does not set a maximum on the percentage of calories people should get each day from healthy sources of fat. In this way, the Healthy Eating Plate recommends the opposite of the low-fat message promoted for decades by the USDA. ? ?[email or web address].no ? ?SUGAR ? ?Sugar is a huge problem in the modern day diet. Sugar is a big contributor to heart disease, diabetes, high triglyceride levels, fatty liver disease and obesity. Sugar is hidden in almost all packaged foods/beverages. Added sugar is extra sugar that is added beyond what is naturally found and has no nutritional benefit for your body. The American Heart Association recommends limiting added sugars to no more than 25g for women and 36 grams for men per day. There are many names for sugar  including maltose, sucrose (names ending in "ose"), high fructose corn syrup, molasses, cane sugar, corn sweetener, raw sugar, syrup, honey or fruit juice concentrate.  ? ?One of the best ways to limit your added sugars is to stop drinking sweetened beverages such as  soda, sweet tea, and fruit juice. ? ?There is 65g of added sugars in one 20oz bottle of Coke! That is equal to 7.5 donuts.  ? ?Pay attention and read all nutrition facts labels. Below is an examples of a nutrition facts label. The #1 is showing you the total sugars where the # 2 is showing you the added sugars. This one serving has almost the max amount of added sugars per day! ? ? ? ? ?20 oz Soda ?65g Sugar = 7.5 Glazed Donuts ? ?16oz Energy  ?Drink ?54g Sugar = 6.5 Glazed Donuts ? ?Large Sweet  ?Tea ?38g Sugar = 4 Glazed Donuts ? ?20oz Sports  ?Drink ?34g Sugar = 3.5 Glazed Donuts ? ?8oz Chocolate Milk ?24g Sugar =2.5 Glazed Donuts ? ?8oz Orange  ?Juice ?21g Sugar = 2 Glazed Donuts ? ?1 Juice Box ?14g Sugar = 1.5 Glazed Donuts ? ?16oz Water= NO SUGAR!! ? ?EXERCISE ? ?Exercise is good. We?ve all heard that. In an ideal world, we would all have time and resources to get plenty of it. When you are active, your heart pumps more efficiently and you will feel better.  Multiple studies show that even walking regularly has benefits that include living a longer life. The American Heart Association recommends 150 minutes per week of exercise (30 minutes per day most days of the week). You can do this in any increment you wish. Nine or more 10-minute walks count. So does an hour-long exercise class. Break the time apart into what will work in your life. Some of the best things you can do include walking briskly, jogging, cycling or swimming laps. Not everyone is ready to ?exercise.? Sometimes we need to start with just getting active. Here are some easy ways to be more active throughout the day: ? Take the stairs instead of the elevator ? Go for a 10-15 minute walk during your lunch break (find a friend to make it more enjoyable) ? When shopping, park at the back of the parking lot ? If you take public transportation, get off one stop early and walk the extra distance ? Pace around while making phone calls ? ?Check with your  doctor if you aren?t sure what your limitations may be. Always remember to drink plenty of water when doing any type of exercise. Don?t feel like a failure if you?re not getting the 90-150 minutes per week. If you started by being a couch potato, then just a 10-minute walk each day is a huge improvement. Start with little victories and work your way up. ? ? ?HEALTHY EATING TIPS ? ?When looking to improve your eating habits, whether to lose weight, lower blood pressure or just be healthier, it helps to know what a serving size is.  ? ?Grains ?1 slice of bread, ? bagel, ? cup pasta or rice  Vegetables ?1 cup fresh or raw vegetables, ? cup cooked or canned ?Fruits ?1 piece of medium sized fruit, ? cup canned,   Meats/Proteins ?? cup dried       1 oz meat, 1 egg, ? cup cooked beans, nuts or seeds ? ?Dairy        Fats ?Individual yogurt container, 1 cup (8oz)    1 teaspoon margarine/butter or  vegetable  ?milk or milk alternative, 1 slice of cheese          oil; 1 tablespoon mayonnaise or salad dressing                 ? ?Plan ahead: make a menu of the meals for a week then create a grocery list to go with that menu. Consider meals that easily stretch into a night of leftovers, such as stews or casseroles. Or consider making two of your favorite meal and put one in the freezer for another night. Try a night or two each week that is ?meatless? or ?no cook? such as salads. When you get home from the grocery store wash and prepare your vegetables and fruits. Then when you need them they are ready to go.  ? ?Tips for going to the grocery store: ? [place] store or generic brands ? Check the weekly ad from your store on-line or in their in-store flyer ? Look at the unit price on the shelf tag to compare/contrast the costs of different items ? Buy fruits/vegetables in season ? Carrots, bananas and apples are low-cost, naturally healthy items ? If meats or frozen vegetables are on sale, buy some extras and put in your freezer ? Limit  buying prepared or ?ready to eat? items, even if they are pre-made salads or fruit snacks ? Do not shop when you?re hungry ? Foods at eye level tend to be more expensive. Look on the high and low shelves for de

## 2021-10-10 NOTE — Progress Notes (Signed)
Patient ID: Emily Lopez                 DOB: 1956/02/20                    MRN: [id number] ? ? ? ? ?HPI: ?Emily Lopez is a 66 y.o. female patient referred to pharmacy clinic by Dr Aundra Dubin to initiate weight loss therapy with GLP1-RA. PMH is significant for HTN, DM2, and obesity. Most recent BMI 31, most recent A1c 7%. ? ?Pt has [employer] which would not cover [id number]. They do cover Ozempic (most recent A1c 7% on 09/12/21) with $68/3 month copay through [facility]. PCP prescribed Jardiance last month for her DM, pt had read that it could worsen kidney function and has not started taking it yet. Worried about her kidney function and potentially needing dialysis in the future. ? ?Current weight management medications: none ? ?Previously tried meds: phen-fen ? ?Current meds that may affect weight: bupropion (weight loss) ? ?Baseline weight/BMI: 190 lbs / 31.6 ? ?Insurance payor: [employer] ? ?Diet:  ?-Breakfast: avocado and egg whites, fruit ?-Lunch: kale ?-Dinner: chicken breast ?-Drinks: 2 coffees, water ? ?Exercise: Walking 5 miles a day, down from 208 recently ? ?Family History: Father with CHF and CAD, died at 83. Mother with HTN, DM, HLD, CKD, alcoholism, and arthritis. ? ?Social History: Former snuff use, quit in 2006, occasional alcohol, denies drug use. ? ?Labs: ?Lab Results  ?Component Value Date  ? HGBA1C 7.0 (H) 09/12/2021  ? ? ?Wt Readings from Last 1 Encounters:  ?08/15/21 200 lb (90.7 kg)  ? ? ?BP Readings from Last 1 Encounters:  ?08/15/21 116/70  ? ?Pulse Readings from Last 1 Encounters:  ?08/15/21 80  ? ? ?   ?Component Value Date/Time  ? CHOL 171 09/12/2021 1339  ? CHOL 175 07/05/2019 0852  ? TRIG 102.0 09/12/2021 1339  ? HDL 74.20 09/12/2021 1339  ? HDL 72 07/05/2019 0852  ? CHOLHDL 2 09/12/2021 1339  ? VLDL 20.4 09/12/2021 1339  ? [place] 76 09/12/2021 1339  ? [place] 86 07/05/2019 0852  ? ? ?Past Medical History:  ?Diagnosis Date  ? Anxiety   ? Arthritis   ? Attention deficit disorder  without mention of hyperactivity   ? Depression   ? Diabetes ([place])   ? Edema of both lower extremities   ? Esophageal reflux   ? High blood sugar   ? High cholesterol   ? Hypertension   ? Iron deficiency anemia, unspecified   ? Myalgia and myositis, unspecified   ? Pneumonia   ? 2008 or 2009  ? Pre-diabetes   ? Undiagnosed cardiac murmurs   ? Unspecified vitamin D deficiency   ? Vitamin B12 deficiency   ? Vitamin D deficiency   ? ? ?Current Outpatient Medications on File Prior to Visit  ?Medication Sig Dispense Refill  ? amLODipine (NORVASC) 10 MG tablet TAKE 1 TABLET(10 MG) BY MOUTH DAILY 90 tablet 1  ? Ascorbic Acid (VITAMIN C) 100 MG tablet Take by mouth daily.    ? buPROPion (WELLBUTRIN SR) 150 MG 12 hr tablet TAKE 1 TABLET(150 MG) BY MOUTH DAILY 30 tablet 3  ? citalopram (CELEXA) 10 MG tablet Take 10 mg by mouth daily.    ? empagliflozin (JARDIANCE) 10 MG TABS tablet Take 1 tablet (10 mg total) by mouth daily. 90 tablet 1  ? GARLIC PO Take 1 tablet by mouth.    ? lisinopril (ZESTRIL) 20 MG tablet TAKE  1 TABLET(20 MG) BY MOUTH DAILY 90 tablet 1  ? metoprolol tartrate (LOPRESSOR) 25 MG tablet TAKE ONE-HALF TABLET DAILY AS NEEDED 90 tablet 1  ? Prenatal Vit-Fe Fumarate-FA (PRENATAL MULTIVITAMIN) TABS tablet Take 1 tablet by mouth daily at 12 noon.    ? vitamin B-12 (CYANOCOBALAMIN) 500 MCG tablet Take by mouth daily.    ? ?No current facility-administered medications on file prior to visit.  ? ? ?Allergies  ?Allergen Reactions  ? No Known Allergies   ? ? ? ?Assessment/Plan: ? ?1. Weight loss - Patient has not met goal of at least 5% of body weight loss with comprehensive lifestyle modifications alone in the past 3-6 months. Pharmacotherapy is appropriate to pursue as augmentation. Will start Ozempic 0.25mg SQ weekly. Confirmed no personal or family history of medullary thyroid carcinoma (MTC) or Multiple Endocrine Neoplasia syndrome type 2 (MEN 2).  ? ?Advised patient on common side effects including nausea,  diarrhea, dyspepsia, decreased appetite, and fatigue. Counseled patient on reducing meal size and how to titrate medication to minimize side effects. Counseled patient to call if intolerable side effects or if experiencing dehydration, abdominal pain, or dizziness. Patient will adhere to dietary modifications and will target at least 150 minutes of moderate intensity exercise weekly.  ? ?Titration Plan:  ?Will plan to follow the titration plan as below, pending patient is tolerating each dose before increasing to the next. Can slow titration if needed for tolerability.  ? ?- Month 1: Inject Ozempic 0.25mg subcutaneously once weekly for 4 weeks ?- Month 2-3: Inject Ozepmic 0.5mg subcutaneously once weekly for 8 weeks (cost savings as 3 mo rx from [facility], will need to stay on this dose longer before titrating) ? ?Follow up in 1 month for dose titration. ? ?2. T2DM - Pt advised to start Jardiance 10mg daily that her PCP prescribed last month. Discussed this will also help preserve her kidney function. Also starting Ozempic, see above. A1cs being followed by PCP. ? ?Megan E. Supple, PharmD, BCACP, CPP ?[facility] ?[address], [place], [place] [address] ?Phone: [phone]; Fax: [phone] ?10/10/2021 2:50 PM ? ? ?

## 2021-10-10 NOTE — Patient Instructions (Addendum)
Your provider has requested that you go to the basement level for lab work before leaving today. Press "B" on the elevator. The lab is located at the first door on the left as you exit the elevator. ? ?You have been scheduled for an endoscopy and colonoscopy. Please follow the written instructions given to you at your visit today. ?Please pick up your prep supplies at the pharmacy within the next 1-3 days. ?If you use inhalers (even only as needed), please bring them with you on the day of your procedure. ? ?Due to recent changes in healthcare laws, you may see the results of your imaging and laboratory studies on MyChart before your provider has had a chance to review them.  We understand that in some cases there may be results that are confusing or concerning to you. Not all laboratory results come back in the same time frame and the provider may be waiting for multiple results in order to interpret others.  Please give [place] 48 hours in order for your provider to thoroughly review all the results before contacting the office for clarification of your results.  ? ?The [facility] providers would like to encourage you to use [facility] to communicate with providers for non-urgent requests or questions.  Due to long hold times on the telephone, sending your provider a message by [facility] may be a faster and more efficient way to get a response.  Please allow 48 business hours for a response.  Please remember that this is for non-urgent requests.  ? ?Thank you for choosing me and [facility]. ? ? ?

## 2021-10-17 ENCOUNTER — Other Ambulatory Visit (INDEPENDENT_AMBULATORY_CARE_PROVIDER_SITE_OTHER): Payer: Medicare PPO

## 2021-10-17 DIAGNOSIS — Z8719 Personal history of other diseases of the digestive system: Secondary | ICD-10-CM | POA: Diagnosis not present

## 2021-10-17 DIAGNOSIS — D649 Anemia, unspecified: Secondary | ICD-10-CM

## 2021-10-17 DIAGNOSIS — Z1211 Encounter for screening for malignant neoplasm of colon: Secondary | ICD-10-CM

## 2021-10-17 LAB — IBC + FERRITIN
Ferritin: 216.7 ng/mL (ref 10.0–291.0)
Iron: 39 ug/dL — ABNORMAL LOW (ref 42–145)
Saturation Ratios: 15.7 % — ABNORMAL LOW (ref 20.0–50.0)
TIBC: 247.8 ug/dL — ABNORMAL LOW (ref 250.0–450.0)
Transferrin: 177 mg/dL — ABNORMAL LOW (ref 212.0–360.0)

## 2021-11-12 ENCOUNTER — Telehealth: Payer: Self-pay | Admitting: Pharmacist

## 2021-11-12 NOTE — Telephone Encounter (Signed)
Called pt to follow up with Ozempic tolerability. States she has not started taking it yet because she wanted to talk to Dr Aundra Dubin about it first. Advised her that Dr Aundra Dubin is the one who referred her to PharmD to start on Ozempic in the first place. She states she had other questions for him and would reach out. She does not have any appts scheduled with him, has only seen him once in 2014.

## 2021-11-23 ENCOUNTER — Ambulatory Visit (AMBULATORY_SURGERY_CENTER): Payer: Medicare PPO | Admitting: Internal Medicine

## 2021-11-23 ENCOUNTER — Encounter: Payer: Self-pay | Admitting: Internal Medicine

## 2021-11-23 VITALS — BP 123/70 | HR 63 | Temp 97.3°F | Resp 11 | Ht 65.0 in | Wt 189.0 lb

## 2021-11-23 DIAGNOSIS — Z8719 Personal history of other diseases of the digestive system: Secondary | ICD-10-CM

## 2021-11-23 DIAGNOSIS — K2950 Unspecified chronic gastritis without bleeding: Secondary | ICD-10-CM

## 2021-11-23 DIAGNOSIS — D122 Benign neoplasm of ascending colon: Secondary | ICD-10-CM | POA: Diagnosis not present

## 2021-11-23 DIAGNOSIS — D509 Iron deficiency anemia, unspecified: Secondary | ICD-10-CM | POA: Diagnosis present

## 2021-11-23 DIAGNOSIS — K2970 Gastritis, unspecified, without bleeding: Secondary | ICD-10-CM | POA: Diagnosis not present

## 2021-11-23 DIAGNOSIS — D649 Anemia, unspecified: Secondary | ICD-10-CM

## 2021-11-23 HISTORY — PX: COLONOSCOPY: SHX174

## 2021-11-23 HISTORY — PX: UPPER GASTROINTESTINAL ENDOSCOPY: SHX188

## 2021-11-23 MED ORDER — HYDROCORTISONE (PERIANAL) 2.5 % EX CREA: 1.0000 | TOPICAL_CREAM | Freq: Two times a day (BID) | CUTANEOUS | 1 refills | Status: AC

## 2021-11-23 MED ORDER — SODIUM CHLORIDE 0.9 % IV SOLN: 500.0000 mL | Freq: Once | INTRAVENOUS | Status: DC

## 2021-11-23 NOTE — Op Note (Signed)
[place] Patient Name: Emily Lopez Procedure Date: 11/23/2021 11:01 AM MRN: [id number] Endoscopist: Sonny Masters "Christia Reading ,  Age: 66 Referring MD:  Date of Birth: Feb 26, 1956 Gender: Female Account #: [id number] Procedure:                Upper GI endoscopy Indications:              Iron deficiency anemia Medicines:                Monitored Anesthesia Care Procedure:                Pre-Anesthesia Assessment:                           - Prior to the procedure, a History and Physical                            was performed, and patient medications and                            allergies were reviewed. The patient's tolerance of                            previous anesthesia was also reviewed. The risks                            and benefits of the procedure and the sedation                            options and risks were discussed with the patient.                            All questions were answered, and informed consent                            was obtained. Prior Anticoagulants: The patient has                            taken no previous anticoagulant or antiplatelet                            agents. ASA Grade Assessment: III - A patient with                            severe systemic disease. After reviewing the risks                            and benefits, the patient was deemed in                            satisfactory condition to undergo the procedure.                           After obtaining informed consent, the endoscope was  passed under direct vision. Throughout the                            procedure, the patient's blood pressure, pulse, and                            oxygen saturations were monitored continuously. The                            Endoscope was introduced through the mouth, and                            advanced to the second part of duodenum. The upper                            GI endoscopy was  accomplished without difficulty.                            The patient tolerated the procedure well. Scope In: Scope Out: Findings:                 White nummular lesions were noted in the distal                            esophagus. Biopsies were taken with a cold forceps                            for histology.                           The entire examined stomach was normal. Biopsies                            were taken with a cold forceps for Helicobacter                            pylori testing.                           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 Biopsies were taken with a cold forceps                            for histology.                           Biopsies for histology were taken with a cold                            forceps in the duodenal bulb and in the second                            portion of the  duodenum for evaluation of celiac                            disease. Complications:            No immediate complications. Estimated Blood Loss:     Estimated blood loss was minimal. Impression:               - White nummular lesions in esophageal mucosa.                            Biopsied.                           - Normal stomach. Biopsied.                           - Duodenal lipoma. Biopsied.                           - Biopsies were taken with a cold forceps for                            evaluation of celiac disease. Recommendation:           - Await pathology results.                           - Perform a colonoscopy today. Sonny Masters "Christia Reading,  11/23/2021 11:34:22 AM

## 2021-11-23 NOTE — Progress Notes (Signed)
Called to room to assist during endoscopic procedure.  Patient ID and intended procedure confirmed with present staff. Received instructions for my participation in the procedure from the performing physician.  

## 2021-11-23 NOTE — Progress Notes (Signed)
A and O x3. Report to RN. Tolerated MAC anesthesia well.Teeth unchanged after procedure. 

## 2021-11-23 NOTE — Progress Notes (Signed)
GASTROENTEROLOGY PROCEDURE H&P NOTE   Primary Care Physician: Horald Pollen, MD    Reason for Procedure:   Iron deficiency anemia, colon cancer screening  Plan:    EGD/colonoscopy  Patient is appropriate for endoscopic procedure(s) in the ambulatory ([place]) setting.  The nature of the procedure, as well as the risks, benefits, and alternatives were carefully and thoroughly reviewed with the patient. Ample time for discussion and questions allowed. The patient understood, was satisfied, and agreed to proceed.     HPI: Emily Lopez is a 66 y.o. female who presents for EGD/colonoscopy for evaluation of IDA and colon cancer screening .  Patient was most recently seen in the Gastroenterology Clinic on 10/10/21.  No interval change in medical history since that appointment. Please refer to that note for full details regarding GI history and clinical presentation.   Past Medical History:  Diagnosis Date   Anxiety    Arthritis    Attention deficit disorder without mention of hyperactivity    Depression    Diabetes ([place])    Edema of both lower extremities    Esophageal reflux    High blood sugar    High cholesterol    Hypertension    Iron deficiency anemia, unspecified    Myalgia and myositis, unspecified    Pneumonia    2008 or 2009   Pre-diabetes    Undiagnosed cardiac murmurs    Vitamin B12 deficiency    Vitamin D deficiency     Past Surgical History:  Procedure Laterality Date   DILATATION & CURETTAGE/HYSTEROSCOPY WITH MYOSURE N/A 07/14/2018   Procedure: DILATATION & CURETTAGE/HYSTEROSCOPY with ultrasound guidance;  Surgeon: Nunzio Cobbs, MD;  Location: [facility];  Service: Gynecology;  Laterality: N/A;  ultrasound guidance needed. Follow previous case.   OPERATIVE ULTRASOUND N/A 07/14/2018   Procedure: OPERATIVE ULTRASOUND ultrasound guided hysteroscopy;  Surgeon: Nunzio Cobbs, MD;  Location: [facility];   Service: Gynecology;  Laterality: N/A;  ultrasound guided hysteroscopy/D&C due to cervical stenosis    Prior to Admission medications   Medication Sig Start Date End Date Taking? Authorizing Provider  amLODipine (NORVASC) 10 MG tablet TAKE 1 TABLET(10 MG) BY MOUTH DAILY 10/08/21   Horald Pollen, MD  empagliflozin (JARDIANCE) 10 MG TABS tablet Take 10 mg by mouth daily.    [provider]  lisinopril (ZESTRIL) 20 MG tablet TAKE 1 TABLET(20 MG) BY MOUTH DAILY 10/08/21   Horald Pollen, MD  metoprolol tartrate (LOPRESSOR) 25 MG tablet TAKE ONE-HALF TABLET DAILY AS NEEDED Patient taking differently: TAKE ONE-HALF TABLET DAILY 10/08/21   Horald Pollen, MD  Semaglutide,0.25 or 0.'5MG'$ /DOS, (OZEMPIC, 0.25 OR 0.5 MG/DOSE,) 2 MG/3ML SOPN Inject 0.5 mg into the skin once a week. 10/10/21   Larey Dresser, MD    Current Outpatient Medications  Medication Sig Dispense Refill   amLODipine (NORVASC) 10 MG tablet TAKE 1 TABLET(10 MG) BY MOUTH DAILY 90 tablet 1   empagliflozin (JARDIANCE) 10 MG TABS tablet Take 10 mg by mouth daily.     lisinopril (ZESTRIL) 20 MG tablet TAKE 1 TABLET(20 MG) BY MOUTH DAILY 90 tablet 1   metoprolol tartrate (LOPRESSOR) 25 MG tablet TAKE ONE-HALF TABLET DAILY AS NEEDED (Patient taking differently: TAKE ONE-HALF TABLET DAILY) 90 tablet 1   Semaglutide,0.25 or 0.'5MG'$ /DOS, (OZEMPIC, 0.25 OR 0.5 MG/DOSE,) 2 MG/3ML SOPN Inject 0.5 mg into the skin once a week. 9 mL 0   No current facility-administered medications  for this visit.    Allergies as of 11/23/2021 - Review Complete 10/10/2021  Allergen Reaction Noted   No known allergies  11/02/2013    Family History  Problem Relation Age of Onset   Hypertension Mother    Diabetes Mother    Arthritis Mother    Hyperlipidemia Mother    Kidney disease Mother    Alcoholism Mother    Lung cancer Father    Heart attack Father    Heart disease Father    Alcoholism Father    Hypertension Sister    Breast  cancer Sister    Hernia Sister    Diabetes Brother    Colon cancer Neg Hx    Esophageal cancer Neg Hx     Social History   Socioeconomic History   Marital status: Legally Separated    Spouse name: Not on file   Number of children: 3   Years of education: Not on file   Highest education level: Not on file  Occupational History   Occupation: Caregiver  Tobacco Use   Smoking status: Never   Smokeless tobacco: Former    Types: Snuff    Quit date: 07/24/2004  Vaping Use   Vaping Use: Never used  Substance and Sexual Activity   Alcohol use: Not Currently    Comment: 2 drinks   Drug use: Never   Sexual activity: Not Currently    Birth control/protection: None, Post-menopausal  Other Topics Concern   Not on file  Social History Narrative   Not on file   Social Determinants of Health   Financial Resource Strain: Not on file  Food Insecurity: Not on file  Transportation Needs: Not on file  Physical Activity: Not on file  Stress: Not on file  Social Connections: Not on file  Intimate Partner Violence: Not on file    Physical Exam: Vital signs in last 24 hours: LMP 06/11/2011 (Within Years)  GEN: NAD EYE: Sclerae anicteric ENT: MMM CV: Non-tachycardic Pulm: No increased WOB GI: Soft NEURO:  Alert & Oriented   Christia Reading, MD [facility]   11/23/2021 10:02 AM

## 2021-11-23 NOTE — Patient Instructions (Signed)
Handout on polyps and diverticulosis given. Pick up Anusol HC from pharmacy.   YOU HAD AN ENDOSCOPIC PROCEDURE TODAY AT [facility]:   Refer to the procedure report that was given to you for any specific questions about what was found during the examination.  If the procedure report does not answer your questions, please call your gastroenterologist to clarify.  If you requested that your care partner not be given the details of your procedure findings, then the procedure report has been included in a sealed envelope for you to review at your convenience later.  YOU SHOULD EXPECT: Some feelings of bloating in the abdomen. Passage of more gas than usual.  Walking can help get rid of the air that was put into your GI tract during the procedure and reduce the bloating. If you had a lower endoscopy (such as a colonoscopy or flexible sigmoidoscopy) you may notice spotting of blood in your stool or on the toilet paper. If you underwent a bowel prep for your procedure, you may not have a normal bowel movement for a few days.  Please Note:  You might notice some irritation and congestion in your nose or some drainage.  This is from the oxygen used during your procedure.  There is no need for concern and it should clear up in a day or so.  SYMPTOMS TO REPORT IMMEDIATELY:  Following lower endoscopy (colonoscopy or flexible sigmoidoscopy):  Excessive amounts of blood in the stool  Significant tenderness or worsening of abdominal pains  Swelling of the abdomen that is new, acute  Fever of 100F or higher  Following upper endoscopy (EGD)  Vomiting of blood or coffee ground material  New chest pain or pain under the shoulder blades  Painful or persistently difficult swallowing  New shortness of breath  Fever of 100F or higher  Black, tarry-looking stools  For urgent or emergent issues, a gastroenterologist can be reached at any hour by calling [phone]. Do not use MyChart  messaging for urgent concerns.    DIET:  We do recommend a small meal at first, but then you may proceed to your regular diet.  Drink plenty of fluids but you should avoid alcoholic beverages for 24 hours.  ACTIVITY:  You should plan to take it easy for the rest of today and you should NOT DRIVE or use heavy machinery until tomorrow (because of the sedation medicines used during the test).    FOLLOW UP: Our staff will call the number listed on your records 24-72 hours following your procedure to check on you and address any questions or concerns that you may have regarding the information given to you following your procedure. If we do not reach you, we will leave a message.  We will attempt to reach you two times.  During this call, we will ask if you have developed any symptoms of COVID 19. If you develop any symptoms (ie: fever, flu-like symptoms, shortness of breath, cough etc.) before then, please call [phone].  If you test positive for Covid 19 in the 2 weeks post procedure, please call and report this information to [place].    If any biopsies were taken you will be contacted by phone or by letter within the next 1-3 weeks.  Please call us at [phone] if you have not heard about the biopsies in 3 weeks.    SIGNATURES/CONFIDENTIALITY: You and/or your care partner have signed paperwork which will be entered into your electronic medical record.  These signatures attest to the fact that that the information above on your After Visit Summary has been reviewed and is understood.  Full responsibility of the confidentiality of this discharge information lies with you and/or your care-partner.  

## 2021-11-23 NOTE — Progress Notes (Signed)
Pt's states no medical or surgical changes since previsit or office visit. 

## 2021-11-23 NOTE — Op Note (Signed)
[facility] Patient Name: Emily Lopez Procedure Date: 11/23/2021 11:00 AM MRN: [id number] Endoscopist: Sonny Masters "Emily Lopez ,  Age: 66 Referring MD:  Date of Birth: [birth year] Gender: Female Account #: [id number] Procedure:                Colonoscopy Indications:              Iron deficiency anemia Medicines:                Monitored Anesthesia Care Procedure:                Pre-Anesthesia Assessment:                           - Prior to the procedure, a History and Physical                            was performed, and patient medications and                            allergies were reviewed. The patient's tolerance of                            previous anesthesia was also reviewed. The risks                            and benefits of the procedure and the sedation                            options and risks were discussed with the patient.                            All questions were answered, and informed consent                            was obtained. Prior Anticoagulants: The patient has                            taken no previous anticoagulant or antiplatelet                            agents. ASA Grade Assessment: III - A patient with                            severe systemic disease. After reviewing the risks                            and benefits, the patient was deemed in                            satisfactory condition to undergo the procedure.                           After obtaining informed consent, the colonoscope  was passed under direct vision. Throughout the                            procedure, the patient's blood pressure, pulse, and                            oxygen saturations were monitored continuously. The                            CF [id number] #[id number] was introduced through the anus                            and advanced to the the terminal ileum. The                            colonoscopy was performed without  difficulty. The                            patient tolerated the procedure well. The quality                            of the bowel preparation was good. The terminal                            ileum, ileocecal valve, appendiceal orifice, and                            rectum were photographed. Scope In: 11:16:15 AM Scope Out: 11:29:57 AM Scope Withdrawal Time: 0 hours 11 minutes 20 seconds  Total Procedure Duration: 0 hours 13 minutes 42 seconds  Findings:                 The terminal ileum appeared normal.                           A 2 mm polyp was found in the cecum. The polyp was                            sessile. The polyp was removed with a cold biopsy                            forceps. Resection and retrieval were complete.                           Multiple small-mouthed diverticula were found in                            the sigmoid colon and descending colon.                           Non-bleeding internal hemorrhoids were found during                            retroflexion. Complications:  No immediate complications. Estimated Blood Loss:     Estimated blood loss was minimal. Impression:               - The examined portion of the ileum was normal.                           - One 2 mm polyp in the cecum, removed with a cold                            biopsy forceps. Resected and retrieved.                           - Diverticulosis in the sigmoid colon and in the                            descending colon.                           - Non-bleeding internal hemorrhoids. Recommendation:           - Discharge patient to home (with escort).                           - Await pathology results.                           - Anusol HC BID for 7 days.                           - The findings and recommendations were discussed                            with the patient.                           - Return to GI clinic in 4 weeks. Sonny Masters "Emily Lopez,  11/23/2021  11:37:32 AM

## 2021-11-26 ENCOUNTER — Telehealth: Payer: Self-pay

## 2021-11-26 NOTE — Telephone Encounter (Signed)
  Follow up Call-     11/23/2021   10:07 AM  Call back number  Post procedure Call Back phone  # [phone]  Permission to leave phone message Yes     Patient questions:  Do you have a fever, pain , or abdominal swelling? No. Pain Score  0 *  Have you tolerated food without any problems? Yes.    Have you been able to return to your normal activities? Yes.    Do you have any questions about your discharge instructions: Diet   No. Medications  No. Follow up visit  No.  Do you have questions or concerns about your Care? No.  Actions: * If pain score is 4 or above: No action needed, pain <4.

## 2021-11-27 ENCOUNTER — Encounter: Payer: Self-pay | Admitting: Internal Medicine

## 2021-11-27 ENCOUNTER — Other Ambulatory Visit: Payer: Self-pay

## 2021-12-20 ENCOUNTER — Telehealth (INDEPENDENT_AMBULATORY_CARE_PROVIDER_SITE_OTHER): Payer: Medicare PPO | Admitting: Emergency Medicine

## 2021-12-20 ENCOUNTER — Encounter: Payer: Self-pay | Admitting: Emergency Medicine

## 2021-12-20 DIAGNOSIS — I152 Hypertension secondary to endocrine disorders: Secondary | ICD-10-CM

## 2021-12-20 DIAGNOSIS — E1159 Type 2 diabetes mellitus with other circulatory complications: Secondary | ICD-10-CM | POA: Diagnosis not present

## 2021-12-20 NOTE — Progress Notes (Signed)
Telemedicine Encounter- SOAP NOTE Established Patient MyChart video encounter Patient: Home  Provider: Office   Patient present only  This video encounter was conducted with the patient's (or proxy's) verbal consent via audio telecommunications: yes/no: Yes Patient was instructed to have this encounter in a suitably private space; and to only have persons present to whom they give permission to participate. In addition, patient identity was confirmed by use of name plus two identifiers (DOB and address).  I discussed the limitations, risks, security and privacy concerns of performing an evaluation and management service by telephone and the availability of in person appointments. I also discussed with the patient that there may be a patient responsible charge related to this service. The patient expressed understanding and agreed to proceed.  I spent a total of TIME; 0 MIN TO 60 MIN: 30 minutes talking with the patient or their proxy.  Chief complaint: Follow-up on diabetes and hypertension  Subjective   Emily Lopez is a 66 y.o. female established patient. Telephone visit today for follow-up of diabetes and hypertension. Last visit 3 months ago.  Presently taking amlodipine and lisinopril but having ankle edema. Taking Jardiance.  Losing weight.  Has not started Ozempic yet. No other complaints or medical concerns today.  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 08/07/2006   Essential hypertension 08/07/2006    Past Medical History:  Diagnosis Date   Anxiety    Arthritis    Attention deficit disorder without mention of hyperactivity    Depression    Diabetes ([place])    Edema of both lower extremities    Esophageal reflux    High blood sugar    High cholesterol    Hypertension    Iron deficiency anemia, unspecified    Myalgia and myositis, unspecified    Pneumonia    2008 or 2009   Pre-diabetes    Undiagnosed cardiac murmurs    Vitamin B12 deficiency    Vitamin D deficiency     Current Outpatient Medications  Medication Sig Dispense Refill   amLODipine (NORVASC) 10 MG tablet TAKE 1 TABLET(10 MG) BY MOUTH DAILY 90 tablet 1   empagliflozin (JARDIANCE) 10 MG TABS tablet Take 10 mg by mouth daily. (Patient not taking: Reported on 11/23/2021)     lisinopril (ZESTRIL) 20 MG tablet TAKE 1 TABLET(20 MG) BY MOUTH DAILY 90 tablet 1   Semaglutide,0.25 or 0.'5MG'$ /DOS, (OZEMPIC, 0.25 OR 0.5 MG/DOSE,) 2 MG/3ML SOPN Inject 0.5 mg into the skin once a week. (Patient not taking: Reported on 11/23/2021) 9 mL 0   No current facility-administered medications for this visit.    No Known Allergies  Social History   Socioeconomic History   Marital status: Legally Separated    Spouse name: Not on file   Number of children: 3   Years of education: Not on file   Highest education level: Not on file  Occupational History   Occupation: Caregiver  Tobacco Use  Smoking status: Never   Smokeless tobacco: Former    Types: Snuff    Quit date: 07/24/2004   Tobacco comments:    Age 4 quit, started at age 79  Vaping Use   Vaping Use: Never used  Substance and Sexual Activity   Alcohol use: Not Currently    Comment: 2 drinks   Drug use: Never   Sexual activity: Not Currently    Birth control/protection: None, Post-menopausal  Other Topics Concern   Not on file  Social History Narrative   Not on file   Social Determinants of  Health   Financial Resource Strain: Not on file  Food Insecurity: Not on file  Transportation Needs: Not on file  Physical Activity: Not on file  Stress: Not on file  Social Connections: Not on file  Intimate Partner Violence: Not on file    Review of Systems  Constitutional: Negative.  Negative for fever.  HENT: Negative.  Negative for congestion and sore throat.   Respiratory: Negative.  Negative for cough and shortness of breath.   Cardiovascular: Negative.  Negative for chest pain and palpitations.  Gastrointestinal:  Negative for abdominal pain, diarrhea, nausea and vomiting.  Genitourinary: Negative.   Skin: Negative.  Negative for rash.  Neurological: Negative.  Negative for dizziness and headaches.  All other systems reviewed and are negative.   Objective   Vitals as reported by the patient: There were no vitals filed for this visit.  Diagnoses and all orders for this visit:  Hypertension associated with diabetes ([place]) -     Comprehensive metabolic panel; Future -     Hemoglobin A1c; Future   Problem List Items Addressed This Visit       Cardiovascular and Mediastinum   Hypertension associated with diabetes ([place]) - Primary    Well-controlled hypertension with normal blood pressure readings at home.  However amlodipine causing ankle edema.  Advised to decrease amlodipine dose to 5 mg daily and continue lisinopril 20 mg daily.  Advised to monitor blood pressure readings at home daily for the next several weeks.  If persistent elevation, increase lisinopril to 40 mg daily. Eating better and losing weight.  Continue Jardiance 10 mg and Ozempic 0.5 mg weekly. Blood work ordered today.      Relevant Orders   Comprehensive metabolic panel   Hemoglobin A1c  Follow-up in 3 months.    I discussed the assessment and treatment plan with the patient. The patient was provided an opportunity to ask questions and all were answered. The patient agreed with the plan and  demonstrated an understanding of the instructions.   The patient was advised to call back or seek an in-person evaluation if the symptoms worsen or if the condition fails to improve as anticipated.  I provided 30 minutes of non-face-to-face time during this encounter.  Horald Pollen, MD  Primary Care at [facility]

## 2021-12-20 NOTE — Assessment & Plan Note (Signed)
Well-controlled hypertension with normal blood pressure readings at home.  However amlodipine causing ankle edema.  Advised to decrease amlodipine dose to 5 mg daily and continue lisinopril 20 mg daily.  Advised to monitor blood pressure readings at home daily for the next several weeks.  If persistent elevation, increase lisinopril to 40 mg daily. Eating better and losing weight.  Continue Jardiance 10 mg and Ozempic 0.5 mg weekly. Blood work ordered today.

## 2021-12-28 ENCOUNTER — Ambulatory Visit: Payer: TRICARE For Life (TFL) | Admitting: Internal Medicine

## 2022-01-09 ENCOUNTER — Other Ambulatory Visit (INDEPENDENT_AMBULATORY_CARE_PROVIDER_SITE_OTHER): Payer: Medicare PPO

## 2022-01-09 ENCOUNTER — Encounter: Payer: Self-pay | Admitting: Internal Medicine

## 2022-01-09 ENCOUNTER — Ambulatory Visit (INDEPENDENT_AMBULATORY_CARE_PROVIDER_SITE_OTHER): Payer: Medicare PPO | Admitting: Internal Medicine

## 2022-01-09 VITALS — BP 120/70 | HR 65 | Ht 65.0 in | Wt 182.1 lb

## 2022-01-09 DIAGNOSIS — K2970 Gastritis, unspecified, without bleeding: Secondary | ICD-10-CM

## 2022-01-09 DIAGNOSIS — Z8719 Personal history of other diseases of the digestive system: Secondary | ICD-10-CM

## 2022-01-09 DIAGNOSIS — K2990 Gastroduodenitis, unspecified, without bleeding: Secondary | ICD-10-CM

## 2022-01-09 DIAGNOSIS — Z86010 Personal history of colonic polyps: Secondary | ICD-10-CM | POA: Diagnosis not present

## 2022-01-09 DIAGNOSIS — D649 Anemia, unspecified: Secondary | ICD-10-CM

## 2022-01-09 DIAGNOSIS — E559 Vitamin D deficiency, unspecified: Secondary | ICD-10-CM

## 2022-01-09 LAB — CBC WITH DIFFERENTIAL/PLATELET
Basophils Absolute: 0 10*3/uL (ref 0.0–0.1)
Basophils Relative: 0.8 % (ref 0.0–3.0)
Eosinophils Absolute: 0.1 10*3/uL (ref 0.0–0.7)
Eosinophils Relative: 1.5 % (ref 0.0–5.0)
HCT: 36.9 % (ref 36.0–46.0)
Hemoglobin: 12.1 g/dL (ref 12.0–15.0)
Lymphocytes Relative: 45.4 % (ref 12.0–46.0)
Lymphs Abs: 2.5 10*3/uL (ref 0.7–4.0)
MCHC: 32.7 g/dL (ref 30.0–36.0)
MCV: 82.7 fl (ref 78.0–100.0)
Monocytes Absolute: 0.4 10*3/uL (ref 0.1–1.0)
Monocytes Relative: 7.3 % (ref 3.0–12.0)
Neutro Abs: 2.5 10*3/uL (ref 1.4–7.7)
Neutrophils Relative %: 45 % (ref 43.0–77.0)
Platelets: 298 10*3/uL (ref 150.0–400.0)
RBC: 4.47 Mil/uL (ref 3.87–5.11)
RDW: 16.8 % — ABNORMAL HIGH (ref 11.5–15.5)
WBC: 5.5 10*3/uL (ref 4.0–10.5)

## 2022-01-09 LAB — IBC + FERRITIN
Ferritin: 214.4 ng/mL (ref 10.0–291.0)
Iron: 47 ug/dL (ref 42–145)
Saturation Ratios: 16.5 % — ABNORMAL LOW (ref 20.0–50.0)
TIBC: 284.2 ug/dL (ref 250.0–450.0)
Transferrin: 203 mg/dL — ABNORMAL LOW (ref 212.0–360.0)

## 2022-01-09 LAB — VITAMIN D 25 HYDROXY (VIT D DEFICIENCY, FRACTURES): VITD: 26.61 ng/mL — ABNORMAL LOW (ref 30.00–100.00)

## 2022-01-09 LAB — VITAMIN B12: Vitamin B-12: 665 pg/mL (ref 211–911)

## 2022-01-09 LAB — FOLATE: Folate: >24.2 ng/mL (ref >5.9)

## 2022-01-09 MED ORDER — VITAMIN D 25 MCG (1000 UNIT) PO TABS: 1000.0000 [IU] | ORAL_TABLET | Freq: Every day | ORAL | 2 refills | Status: AC

## 2022-01-09 NOTE — Patient Instructions (Signed)
Your provider has requested that you go to the basement level for lab work before leaving today. Press "B" on the elevator. The lab is located at the first door on the left as you exit the elevator.  Follow up with Dr. Lorenso Courier as needed.   The [facility] providers would like to encourage you to use [facility] to communicate with providers for non-urgent requests or questions.  Due to long hold times on the telephone, sending your provider a message by [facility] may be a faster and more efficient way to get a response.  Please allow 48 business hours for a response.  Please remember that this is for non-urgent requests.   Due to recent changes in healthcare laws, you may see the results of your imaging and laboratory studies on MyChart before your provider has had a chance to review them.  We understand that in some cases there may be results that are confusing or concerning to you. Not all laboratory results come back in the same time frame and the provider may be waiting for multiple results in order to interpret others.  Please give [place] 48 hours in order for your provider to thoroughly review all the results before contacting the office for clarification of your results.

## 2022-01-09 NOTE — Progress Notes (Signed)
Chief Complaint: Anemia  HPI : 66 year old female with history of DM, GERD, IDA presents for follow up of anemia  Interval History: She has been taking her daily iron supplements. The iron tablets are causing her to have a BM regularly. Denies acid reflux issues. She does have some occasional fatigue  Wt Readings from Last 3 Encounters:  01/09/22 182 lb 2 oz (82.6 kg)  11/23/21 189 lb (85.7 kg)  10/10/21 190 lb (86.2 kg)   Current Outpatient Medications  Medication Sig Dispense Refill   amLODipine (NORVASC) 10 MG tablet TAKE 1 TABLET(10 MG) BY MOUTH DAILY (Patient taking differently: TAKE 1/2TABLET(10 MG) BY MOUTH DAILY) 90 tablet 1   empagliflozin (JARDIANCE) 10 MG TABS tablet Take 10 mg by mouth daily.     lisinopril (ZESTRIL) 20 MG tablet TAKE 1 TABLET(20 MG) BY MOUTH DAILY 90 tablet 1   Semaglutide,0.25 or 0.'5MG'$ /DOS, (OZEMPIC, 0.25 OR 0.5 MG/DOSE,) 2 MG/3ML SOPN Inject 0.5 mg into the skin once a week. 9 mL 0   No current facility-administered medications for this visit.   Review of Systems: All systems reviewed and negative except where noted in HPI.   Physical Exam: BP 120/70   Pulse 65   Ht '5\' 5"'$  (1.651 m)   Wt 182 lb 2 oz (82.6 kg)   LMP 06/11/2011 (Within Years)   SpO2 99%   BMI 30.31 kg/m  Constitutional: Pleasant,well-developed, female in no acute distress. HEENT: Normocephalic and atraumatic. Conjunctivae are normal. No scleral icterus. Cardiovascular: Normal rate, regular rhythm.  Pulmonary/chest: Effort normal and breath sounds normal. No wheezing, rales or rhonchi. Abdominal: Soft, nondistended, nontender. Bowel sounds active throughout. There are no masses palpable. No hepatomegaly. Extremities: No edema Neurological: Alert and oriented to person place and time. Skin: Skin is warm and dry. No rashes noted. Psychiatric: Normal mood and affect. Behavior is normal.  Labs 08/2021: TSH nml  Labs 09/2021: CBC with low Hb of 11.8, MCV 82. CMP with low potassium  of 3.2 and elevated Cr of 1.27.  Labs 10/2021: Ferritin/IBC with low iron sat of 15.7%.  Abd U/S 12/17/13: IMPRESSION:  Bilateral simple renal cysts are noted. Probable fatty infiltration  of the liver.   EGD 11/23/21: - White nummular lesions in esophageal mucosa. Biopsied. - Normal stomach. Biopsied. - Duodenal lipoma. Biopsied. - Biopsies were taken with a cold forceps for evaluation of celiac disease. Path: 1. Surgical [P], duodenal nodule - DUODENAL MUCOSA WITH NO SIGNIFICANT PATHOLOGY. NOTE: THE ENDOSCOPIC IMPRESSION OF A NODULE IS NOTED. A MORPHOLOGIC CORRELATE TO A NODULE IS NOT IDENTIFIED; HOWEVER THE SPECIMEN CONSISTS OF SUPERFICIAL FRAGMENTS OF MUCOSA ONLY. SUBMUCOSA IS NOT PRESENT FOR EVALUATION IF ENDOSCOPICALLY THIS COULD BE A SUBMUCOSALLY LOCATED NODULE. 2. Surgical [P], duodenal - DUODENAL MUCOSA WITHIN NORMAL LIMITS. 3. Surgical [P], gastric - ANTRAL AND OXYNTIC MUCOSA WITH MILD CHRONIC INACTIVE GASTRITIS. - NO HELICOBACTER PYLORI ORGANISMS IDENTIFIED ON H&E STAINED SLIDE. 4. Surgical [P], esophagus - SQUAMOUS MUCOSA WITH NO SIGNIFICANT PATHOLOGY.  Colonoscopy 11/23/21: - The examined portion of the ileum was normal. - One 2 mm polyp in the cecum, removed with a cold biopsy forceps. Resected and retrieved. - Diverticulosis in the sigmoid colon and in the descending colon. - Non-bleeding internal hemorrhoids. Path: 5. Surgical [P], colon, ascending, polyp (1) - TUBULAR ADENOMA.  ASSESSMENT AND PLAN: IDA History of GERD History of colon polyp Patient presents for follow up of IDA. She has been compliant with her iron supplements. Her recent EGD and colonoscopy revealed  some mild gastritis and a precancerous colon polyp but was otherwise unremarkable. Will plan to recheck her blood counts today and iron levels. Since patient does describe some occasional fatigue today, will go ahead and check for some common vitamin deficiencies as well - Check CBC, ferritin/IBC,  vitamin D, vitamin B12, folate - Next colonoscopy 11/2028 for polyp surveillance - RTC PRN  Christia Reading, MD

## 2022-01-16 ENCOUNTER — Encounter (INDEPENDENT_AMBULATORY_CARE_PROVIDER_SITE_OTHER): Payer: Self-pay

## 2022-01-30 ENCOUNTER — Other Ambulatory Visit (INDEPENDENT_AMBULATORY_CARE_PROVIDER_SITE_OTHER): Payer: Medicare PPO

## 2022-01-30 DIAGNOSIS — I152 Hypertension secondary to endocrine disorders: Secondary | ICD-10-CM | POA: Diagnosis not present

## 2022-01-30 DIAGNOSIS — E1159 Type 2 diabetes mellitus with other circulatory complications: Secondary | ICD-10-CM

## 2022-01-30 LAB — COMPREHENSIVE METABOLIC PANEL
ALT: 11 U/L (ref 0–35)
AST: 17 U/L (ref 0–37)
Albumin: 4.5 g/dL (ref 3.5–5.2)
Alkaline Phosphatase: 67 U/L (ref 39–117)
BUN: 15 mg/dL (ref 6–23)
CO2: 29 mEq/L (ref 19–32)
Calcium: 9.8 mg/dL (ref 8.4–10.5)
Chloride: 101 mEq/L (ref 96–112)
Creatinine, Ser: 1.2 mg/dL (ref 0.40–1.20)
GFR: 47.3 mL/min — ABNORMAL LOW (ref >60.00)
Glucose, Bld: 99 mg/dL (ref 70–99)
Potassium: 3.9 mEq/L (ref 3.5–5.1)
Sodium: 137 mEq/L (ref 135–145)
Total Bilirubin: 0.6 mg/dL (ref 0.2–1.2)
Total Protein: 8.1 g/dL (ref 6.0–8.3)

## 2022-01-30 LAB — HEMOGLOBIN A1C: Hgb A1c MFr Bld: 6.2 % (ref 4.6–6.5)

## 2022-02-22 ENCOUNTER — Other Ambulatory Visit: Payer: Self-pay | Admitting: Cardiology

## 2022-02-22 DIAGNOSIS — E1169 Type 2 diabetes mellitus with other specified complication: Secondary | ICD-10-CM

## 2022-04-10 ENCOUNTER — Ambulatory Visit: Payer: Medicare PPO | Admitting: Emergency Medicine

## 2022-04-17 ENCOUNTER — Encounter: Payer: Self-pay | Admitting: Emergency Medicine

## 2022-04-17 ENCOUNTER — Encounter: Payer: Medicare PPO | Admitting: Emergency Medicine

## 2022-05-01 DIAGNOSIS — Z1231 Encounter for screening mammogram for malignant neoplasm of breast: Secondary | ICD-10-CM | POA: Diagnosis not present

## 2022-05-13 ENCOUNTER — Ambulatory Visit (INDEPENDENT_AMBULATORY_CARE_PROVIDER_SITE_OTHER): Payer: Medicare PPO | Admitting: *Deleted

## 2022-05-13 DIAGNOSIS — Z0000 Encounter for general adult medical examination without abnormal findings: Secondary | ICD-10-CM | POA: Diagnosis not present

## 2022-05-13 NOTE — Patient Instructions (Signed)

## 2022-05-13 NOTE — Progress Notes (Signed)
Subjective:   Emily Lopez is a 66 y.o. female who presents for an Initial Medicare Annual Wellness Visit. I connected with  Emily Lopez on 05/13/22 by a audio enabled telemedicine application and verified that I am speaking with the correct person using two identifiers.  Patient Location: Home  Provider Location: Home Office  I discussed the limitations of evaluation and management by telemedicine. The patient expressed understanding and agreed to proceed.  Review of Systems    Deferred to PCP Cardiac Risk Factors include: advanced age (>89mn, >>59women);diabetes mellitus;hypertension     Objective:    There were no vitals filed for this visit. There is no height or weight on file to calculate BMI.     05/13/2022    3:08 PM 07/12/2020    8:16 AM 05/30/2020    7:55 AM 07/14/2018    8:16 AM 04/25/2017   10:06 PM 12/13/2016    1:27 PM 10/21/2016    2:04 PM  Advanced Directives  Does Patient Have a Medical Advance Directive? No No No No No No No  Would patient like information on creating a medical advance directive? No - Patient declined No - Patient declined No - Patient declined Yes (MAU/Ambulatory/Procedural Areas - Information given) No - Patient declined Yes (ED - Information included in AVS)     Current Medications (verified) Outpatient Encounter Medications as of 05/13/2022  Medication Sig   amLODipine (NORVASC) 10 MG tablet TAKE 1 TABLET(10 MG) BY MOUTH DAILY (Patient taking differently: TAKE 1/2TABLET(10 MG) BY MOUTH DAILY)   cholecalciferol (VITAMIN D3) 25 MCG (1000 UNIT) tablet Take 1 tablet (1,000 Units total) by mouth daily.   lisinopril (ZESTRIL) 20 MG tablet TAKE 1 TABLET(20 MG) BY MOUTH DAILY   OZEMPIC, 0.25 OR 0.5 MG/DOSE, 2 MG/3ML SOPN INJECT 0.5 MG UNDER THE SKIN ONCE A WEEK   empagliflozin (JARDIANCE) 10 MG TABS tablet Take 10 mg by mouth daily. (Patient not taking: Reported on 05/13/2022)   metoprolol tartrate (LOPRESSOR) 25 MG tablet Take 12.5 mg by  mouth 2 (two) times daily. (Patient not taking: Reported on 05/13/2022)   No facility-administered encounter medications on file as of 05/13/2022.    Allergies (verified) Patient has no known allergies.   History: Past Medical History:  Diagnosis Date   Anxiety    Arthritis    Attention deficit disorder without mention of hyperactivity    Depression    [place]    Edema of both lower extremities    Esophageal reflux    High blood sugar    High cholesterol    Hypertension    Iron deficiency anemia, unspecified    Myalgia and myositis, unspecified    Pneumonia    2008 or 2009   Pre-diabetes    Undiagnosed cardiac murmurs    Vitamin B12 deficiency    Vitamin D deficiency    Past Surgical History:  Procedure Laterality Date   COLONOSCOPY  11/23/2021   DILATATION & CURETTAGE/HYSTEROSCOPY WITH MYOSURE N/A 07/14/2018   Procedure: DILATATION & CURETTAGE/HYSTEROSCOPY with ultrasound guidance;  Surgeon: ANunzio Cobbs MD;  [facility]  Service: Gynecology;  Laterality: N/A;  ultrasound guidance needed. Follow previous case.   OPERATIVE ULTRASOUND N/A 07/14/2018   Procedure: OPERATIVE ULTRASOUND ultrasound guided hysteroscopy;  Surgeon: ANunzio Cobbs MD;  [facility]  Service: Gynecology;  Laterality: N/A;  ultrasound guided hysteroscopy/D&C due to cervical stenosis   UPPER GASTROINTESTINAL ENDOSCOPY  11/23/2021  Family History  Problem Relation Age of Onset   Hypertension Mother    Diabetes Mother    Arthritis Mother    Hyperlipidemia Mother    Kidney disease Mother    Alcoholism Mother    Lung cancer Father    Heart attack Father    Heart disease Father    Alcoholism Father    Hypertension Sister    Breast cancer Sister    Hernia Sister    Diabetes Brother    Colon cancer Neg Hx    Esophageal cancer Neg Hx    Colon polyps Neg Hx    Rectal cancer Neg Hx    Stomach cancer Neg Hx     Social History   Socioeconomic History   Marital status: Legally Separated    Spouse name: Not on file   Number of children: 3   Years of education: Not on file   Highest education level: Not on file  Occupational History   Occupation: Caregiver  Tobacco Use   Smoking status: Never   Smokeless tobacco: Former    Types: Snuff    Quit date: 07/24/2004   Tobacco comments:    Age 37 quit, started at age 56  Vaping Use   Vaping Use: Never used  Substance and Sexual Activity   Alcohol use: Not Currently    Comment: 2 drinks   Drug use: Never   Sexual activity: Not Currently    Birth control/protection: None, Post-menopausal  Other Topics Concern   Not on file  Social History Narrative   Not on file   Social Determinants of Health   Financial Resource Strain: Low Risk  (05/13/2022)   Overall Financial Resource Strain (CARDIA)    Difficulty of Paying Living Expenses: Not hard at all  Food Insecurity: No Food Insecurity (05/13/2022)   Hunger Vital Sign    Worried About Running Out of Food in the Last Year: Never true    Ran Out of Food in the Last Year: Never true  Transportation Needs: No Transportation Needs (05/13/2022)   PRAPARE - Hydrologist (Medical): No    Lack of Transportation (Non-Medical): No  Physical Activity: Insufficiently Active (05/13/2022)   Exercise Vital Sign    Days of Exercise per Week: 2 days    Minutes of Exercise per Session: 30 min  Stress: No Stress Concern Present (05/13/2022)   [place]    Feeling of Stress : Not at all  Social Connections: Unknown (05/13/2022)   Social Connection and Isolation Panel [NHANES]    Frequency of Communication with Friends and Family: More than three times a week    Frequency of Social Gatherings with Friends and Family: More than three times a week    Attends Religious Services: More than 4 times per year    Active  Member of [employer] or Organizations: Yes    Attends Music therapist: More than 4 times per year    Marital Status: Not on file    Tobacco Counseling Counseling given: Not Answered Tobacco comments: Age 82 quit, started at age 45   Clinical Intake:  Pre-visit preparation completed: Yes  Pain : No/denies pain     Nutritional Status: BMI > 30  Obese Nutritional Risks: None Diabetes: Yes CBG done?: No (phone visit) Did pt. bring in CBG monitor from home?: No (phone visit)     Diabetic?Yes Nutrition Risk Assessment:  Has the patient had any  N/V/D within the last 2 months?  No  Does the patient have any non-healing wounds?  No  Has the patient had any unintentional weight loss or weight gain?  No   Diabetes:  Is the patient diabetic?  Yes  If diabetic, was a CBG obtained today?  No  Did the patient bring in their glucometer from home?  No  How often do you monitor your CBG's? Reports she does not check her blood sugar at home.   Financial Strains and Diabetes Management:  Are you having any financial strains with the device, your supplies or your medication? No .  Does the patient want to be seen by Chronic Care Management for management of their diabetes?  No  Would the patient like to be referred to a Nutritionist or for Diabetic Management?  No   Diabetic Exams:  Diabetic Eye Exam: Overdue for diabetic eye exam. Pt has been advised about the importance in completing this exam. Patient advised to call and schedule an eye exam. Diabetic Foot Exam: Overdue, Pt has been advised about the importance in completing this exam. Pt is scheduled for diabetic foot exam on deferred to PCP.  Interpreter Needed?: No  Information entered by :: Emelia Loron RN   Activities of Daily Living    05/13/2022    3:08 PM  In your present state of health, do you have any difficulty performing the following activities:  Hearing? 0  Vision? 0  Difficulty concentrating or making  decisions? 0  Walking or climbing stairs? 0  Dressing or bathing? 0  Doing errands, shopping? 0  Preparing Food and eating ? N  Using the Toilet? N  In the past six months, have you accidently leaked urine? N  Do you have problems with loss of bowel control? N  Managing your Medications? N  Managing your Finances? N  Housekeeping or managing your Housekeeping? N    Patient Care Team: Horald Pollen, MD as PCP - General (Internal Medicine) Melina Schools, MD as Consulting Physician (Orthopedic Surgery)  Indicate any recent Medical Services you may have received from other than [facility] providers in the past year (date may be approximate).     Assessment:   This is a routine wellness examination for Emily Lopez.  Hearing/Vision screen No results found.  Dietary issues and exercise activities discussed: Current Exercise Habits: Home exercise routine, Time (Minutes): 30, Frequency (Times/Week): 2, Weekly Exercise (Minutes/Week): 60, Intensity: Mild, Exercise limited by: None identified   Goals Addressed             This Visit's Progress    Patient Stated       Keep my diabetes under control.      Depression Screen    05/13/2022    3:08 PM 08/15/2021    1:54 PM 01/09/2021    4:02 PM 07/12/2020    8:17 AM 07/10/2020    7:52 AM 06/22/2020    8:06 AM 04/18/2020    8:56 AM  PHQ 2/9 Scores  PHQ - 2 Score 0 0 0 0 6 0 2  PHQ- 9 Score     26  4    Fall Risk    05/13/2022    3:09 PM 08/15/2021    1:54 PM 01/09/2021    4:02 PM 07/12/2020    8:17 AM 06/22/2020    8:06 AM  [place] in the past year? 0 0 0 0 0  Number falls in past yr: 0 0 0  0  Injury with Fall? 0 0 0  0  Risk for fall due to : No Fall Risks      Follow up Falls evaluation completed    Falls evaluation completed    FALL RISK PREVENTION PERTAINING TO THE HOME:  Any stairs in or around the home? Yes  If so, are there any without handrails? Yes  Home free of loose throw rugs in walkways, pet beds,  electrical cords, etc? Yes  Adequate lighting in your home to reduce risk of falls? Yes   ASSISTIVE DEVICES UTILIZED TO PREVENT FALLS:  Life alert? No  Use of a cane, walker or w/c? No  Grab bars in the bathroom? No  Shower chair or bench in shower? No  Elevated toilet seat or a handicapped toilet? No   Cognitive Function:        05/13/2022    3:09 PM  6CIT Screen  What Year? 0 points  What month? 0 points  What time? 0 points  Count back from 20 0 points  Months in reverse 0 points  Repeat phrase 0 points  Total Score 0 points    Immunizations Immunization History  Administered Date(s) Administered   Hepatitis A, Adult 12/24/2013   Hepatitis B 01/25/2014   Hepatitis B, PED/ADOLESCENT 01/25/2014   Hepatitis B, adult 12/24/2013   Influenza Split 01/25/2014   Influenza,inj,Quad PF,6+ Mos 05/22/2017, 04/27/2018, 03/28/2019   Moderna Sars-Covid-2 Vaccination 08/10/2019, 09/08/2019   PPD Test 07/01/2015   Pneumococcal Conjugate-13 05/22/2017   Pneumococcal Polysaccharide-23 04/27/2018   Tdap 12/24/2013   Zoster Recombinat (Shingrix) 04/07/2019    TDAP status: Up to date  Flu Vaccine status: Due, Education has been provided regarding the importance of this vaccine. Advised may receive this vaccine at local pharmacy or Health Dept. Aware to provide a copy of the vaccination record if obtained from local pharmacy or Health Dept. Verbalized acceptance and understanding.  Pneumococcal vaccine status: Up to date  Covid-19 vaccine status: Information provided on how to obtain vaccines.   Qualifies for Shingles Vaccine? Yes   Zostavax completed No   Shingrix Completed?: Yes  Screening Tests Health Maintenance  Topic Date Due   FOOT EXAM  07/04/2020   OPHTHALMOLOGY EXAM  12/15/2021   COVID-19 Vaccine (3 - 2023-24 season) 02/08/2022   INFLUENZA VACCINE  09/08/2022 (Originally 01/08/2022)   HEMOGLOBIN A1C  08/02/2022   Diabetic kidney evaluation - Urine ACR  09/13/2022    Diabetic kidney evaluation - GFR measurement  01/31/2023   MAMMOGRAM  04/01/2023   Pneumonia Vaccine 67+ Years old (3 - PPSV23 or PCV20) 04/28/2023   Medicare Annual Wellness (AWV)  05/14/2023   DTaP/Tdap/Td (2 - Td or Tdap) 12/25/2023   COLONOSCOPY (Pts 45-18yr Insurance coverage will need to be confirmed)  11/23/2028   DEXA SCAN  Completed   Hepatitis C Screening  Completed   Zoster Vaccines- Shingrix  Completed   HPV VACCINES  Aged Out    Health Maintenance  Health Maintenance Due  Topic Date Due   FOOT EXAM  07/04/2020   OPHTHALMOLOGY EXAM  12/15/2021   COVID-19 Vaccine (3 - 2023-24 season) 02/08/2022    Colorectal cancer screening: Type of screening: Colonoscopy. Completed 11/23/21. Repeat every 7 years  Mammogram status: Completed 03/31/21. Repeat every year  Bone Density status: Completed 01/18/14. Results reflect: Bone density results: NORMAL. Repeat every once years.  Lung Cancer Screening: (Low Dose CT Chest recommended if Age 698-80years, 30 pack-year currently smoking OR have quit w/in 15years.)  does not qualify.   Additional Screening:  Hepatitis C Screening: does qualify; Completed 05/22/17  Vision Screening: Recommended annual ophthalmology exams for early detection of glaucoma and other disorders of the eye. Is the patient up to date with their annual eye exam?  No  Who is the provider or what is the name of the office in which the patient attends annual eye exams? Dr. Katy Fitch If pt is not established with a provider, would they like to be referred to a provider to establish care?  N/A , patient states she will call to make an appointment  Dental Screening: Recommended annual dental exams for proper oral hygiene  Community Resource Referral / Chronic Care Management: CRR required this visit?  No   CCM required this visit?  No      Plan:     I have personally reviewed and noted the following in the patient's chart:   Medical and social history Use of  alcohol, tobacco or illicit drugs  Current medications and supplements including opioid prescriptions. Patient is not currently taking opioid prescriptions. Functional ability and status Nutritional status Physical activity Advanced directives List of other physicians Hospitalizations, surgeries, and ER visits in previous 12 months Vitals Screenings to include cognitive, depression, and falls Referrals and appointments  In addition, I have reviewed and discussed with patient certain preventive protocols, quality metrics, and best practice recommendations. A written personalized care plan for preventive services as well as general preventive health recommendations were provided to patient.     Michiel Cowboy, RN   05/13/2022   Nurse Notes:  Emily Lopez , Thank you for taking time to come for your Medicare Wellness Visit. I appreciate your ongoing commitment to your health goals. Please review the following plan we discussed and let me know if I can assist you in the future.   These are the goals we discussed:  Goals      Patient Stated     Keep my diabetes under control.        This is a list of the screening recommended for you and due dates:  Health Maintenance  Topic Date Due   Complete foot exam   07/04/2020   Eye exam for diabetics  12/15/2021   COVID-19 Vaccine (3 - 2023-24 season) 02/08/2022   Flu Shot  09/08/2022*   Hemoglobin A1C  08/02/2022   Yearly kidney health urinalysis for diabetes  09/13/2022   Yearly kidney function blood test for diabetes  01/31/2023   Mammogram  04/01/2023   Pneumonia Vaccine (3 - PPSV23 or PCV20) 04/28/2023   Medicare Annual Wellness Visit  05/14/2023   DTaP/Tdap/Td vaccine (2 - Td or Tdap) 12/25/2023   Colon Cancer Screening  11/23/2028   DEXA scan (bone density measurement)  Completed   Hepatitis C Screening: USPSTF Recommendation to screen - Ages 23-79 yo.  Completed   Zoster (Shingles) Vaccine  Completed   HPV Vaccine  Aged Out   *Topic was postponed. The date shown is not the original due date.

## 2022-05-15 ENCOUNTER — Ambulatory Visit: Payer: Medicare PPO | Admitting: Emergency Medicine

## 2022-05-15 DIAGNOSIS — R92321 Mammographic fibroglandular density, right breast: Secondary | ICD-10-CM | POA: Diagnosis not present

## 2022-05-15 LAB — HM MAMMOGRAPHY

## 2022-05-29 ENCOUNTER — Ambulatory Visit (INDEPENDENT_AMBULATORY_CARE_PROVIDER_SITE_OTHER): Payer: Medicare PPO | Admitting: Emergency Medicine

## 2022-05-29 ENCOUNTER — Encounter: Payer: Self-pay | Admitting: Emergency Medicine

## 2022-05-29 VITALS — BP 120/82 | HR 72 | Temp 98.2°F | Ht 65.0 in | Wt 169.0 lb

## 2022-05-29 DIAGNOSIS — I152 Hypertension secondary to endocrine disorders: Secondary | ICD-10-CM

## 2022-05-29 DIAGNOSIS — D649 Anemia, unspecified: Secondary | ICD-10-CM | POA: Diagnosis not present

## 2022-05-29 DIAGNOSIS — N1832 Chronic kidney disease, stage 3b: Secondary | ICD-10-CM | POA: Insufficient documentation

## 2022-05-29 DIAGNOSIS — E1159 Type 2 diabetes mellitus with other circulatory complications: Secondary | ICD-10-CM

## 2022-05-29 DIAGNOSIS — I10 Essential (primary) hypertension: Secondary | ICD-10-CM | POA: Diagnosis not present

## 2022-05-29 DIAGNOSIS — E1169 Type 2 diabetes mellitus with other specified complication: Secondary | ICD-10-CM | POA: Diagnosis not present

## 2022-05-29 LAB — CBC WITH DIFFERENTIAL/PLATELET
Basophils Absolute: 0.1 10*3/uL (ref 0.0–0.1)
Basophils Relative: 2 % (ref 0.0–3.0)
Eosinophils Absolute: 0.1 10*3/uL (ref 0.0–0.7)
Eosinophils Relative: 1.5 % (ref 0.0–5.0)
HCT: 37.2 % (ref 36.0–46.0)
Hemoglobin: 12.4 g/dL (ref 12.0–15.0)
Lymphocytes Relative: 39.6 % (ref 12.0–46.0)
Lymphs Abs: 2.6 10*3/uL (ref 0.7–4.0)
MCHC: 33.4 g/dL (ref 30.0–36.0)
MCV: 85 fl (ref 78.0–100.0)
Monocytes Absolute: 0.5 10*3/uL (ref 0.1–1.0)
Monocytes Relative: 7 % (ref 3.0–12.0)
Neutro Abs: 3.2 10*3/uL (ref 1.4–7.7)
Neutrophils Relative %: 49.9 % (ref 43.0–77.0)
Platelets: 338 10*3/uL (ref 150.0–400.0)
RBC: 4.37 Mil/uL (ref 3.87–5.11)
RDW: 15.9 % — ABNORMAL HIGH (ref 11.5–15.5)
WBC: 6.5 10*3/uL (ref 4.0–10.5)

## 2022-05-29 LAB — COMPREHENSIVE METABOLIC PANEL
ALT: 11 U/L (ref 0–35)
AST: 17 U/L (ref 0–37)
Albumin: 4.3 g/dL (ref 3.5–5.2)
Alkaline Phosphatase: 70 U/L (ref 39–117)
BUN: 15 mg/dL (ref 6–23)
CO2: 29 mEq/L (ref 19–32)
Calcium: 9.8 mg/dL (ref 8.4–10.5)
Chloride: 103 mEq/L (ref 96–112)
Creatinine, Ser: 1.21 mg/dL — ABNORMAL HIGH (ref 0.40–1.20)
GFR: 46.72 mL/min — ABNORMAL LOW (ref >60.00)
Glucose, Bld: 106 mg/dL — ABNORMAL HIGH (ref 70–99)
Potassium: 3.6 mEq/L (ref 3.5–5.1)
Sodium: 141 mEq/L (ref 135–145)
Total Bilirubin: 0.6 mg/dL (ref 0.2–1.2)
Total Protein: 7.8 g/dL (ref 6.0–8.3)

## 2022-05-29 LAB — LIPID PANEL
Cholesterol: 194 mg/dL (ref 0–200)
HDL: 83 mg/dL (ref >39.00)
LDL Cholesterol: 90 mg/dL (ref 0–99)
NonHDL: 110.51
Total CHOL/HDL Ratio: 2
Triglycerides: 105 mg/dL (ref 0.0–149.0)
VLDL: 21 mg/dL (ref 0.0–40.0)

## 2022-05-29 LAB — POCT GLYCOSYLATED HEMOGLOBIN (HGB A1C): Hemoglobin A1C: 5.3 % (ref 4.0–5.6)

## 2022-05-29 MED ORDER — LISINOPRIL 20 MG PO TABS: ORAL_TABLET | ORAL | 3 refills | Status: DC

## 2022-05-29 MED ORDER — OZEMPIC (0.25 OR 0.5 MG/DOSE) 2 MG/3ML ~~LOC~~ SOPN: PEN_INJECTOR | SUBCUTANEOUS | 3 refills | Status: DC

## 2022-05-29 MED ORDER — AMLODIPINE BESYLATE 10 MG PO TABS: ORAL_TABLET | ORAL | 3 refills | Status: DC

## 2022-05-29 NOTE — Progress Notes (Signed)
Emily Lopez 66 y.o.   Chief Complaint  Patient presents with   Follow-up    67mth f/u appt, patient has concerns about her BP, as well as medications she is taking     HISTORY OF PRESENT ILLNESS: This is a 66y.o. female A1A here for 611-monthollow-up of hypertension and diabetes. Overall doing well.  Eating better and losing weight.  Taking weekly Ozempic, lisinopril and amlodipine. Has no complaints or medical concerns today.  HPI   Prior to Admission medications   Medication Sig Start Date End Date Taking? Authorizing Provider  amLODipine (NORVASC) 10 MG tablet TAKE 1 TABLET(10 MG) BY MOUTH DAILY Patient taking differently: TAKE 1/2TABLET(10 MG) BY MOUTH DAILY 10/08/21  Yes Turquoise Esch, MiInes BloomerMD  cholecalciferol (VITAMIN D3) 25 MCG (1000 UNIT) tablet Take 1 tablet (1,000 Units total) by mouth daily. 01/09/22  Yes DoSharyn CreamerMD  lisinopril (ZESTRIL) 20 MG tablet TAKE 1 TABLET(20 MG) BY MOUTH DAILY 10/08/21  Yes Marselino Slayton, MiCaleMD  OZEMPIC, 0.25 OR 0.5 MG/DOSE, 2 MG/3ML SOPN INJECT 0.5 MG UNDER THE SKIN ONCE A WEEK 02/26/22  Yes McLarey DresserMD  empagliflozin (JARDIANCE) 10 MG TABS tablet Take 10 mg by mouth daily. Patient not taking: Reported on 05/13/2022    [provider]  metoprolol tartrate (LOPRESSOR) 25 MG tablet Take 12.5 mg by mouth 2 (two) times daily. 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 08/07/2006   Hypertension associated with diabetes (HCAristes02/28/2008    Past Medical History:  Diagnosis Date   Anxiety    Arthritis    Attention deficit disorder without mention of hyperactivity    Depression    Diabetes ([facility]   Edema of both lower extremities    Esophageal reflux    High blood sugar    High cholesterol    Hypertension    Iron deficiency anemia, unspecified    Myalgia and myositis, unspecified    Pneumonia    2008 or 2009   Pre-diabetes    Undiagnosed cardiac murmurs    Vitamin B12 deficiency    Vitamin D deficiency     Past Surgical History:  Procedure Laterality Date   COLONOSCOPY  11/23/2021   DILATATION & CURETTAGE/HYSTEROSCOPY WITH MYOSURE N/A 07/14/2018   Procedure: DILATATION & CURETTAGE/HYSTEROSCOPY with ultrasound guidance;  Surgeon: AmNunzio CobbsMD;  [facility];  Laterality: N/A;  ultrasound guidance needed. Follow previous case.   OPERATIVE ULTRASOUND N/A 07/14/2018   Procedure: OPERATIVE ULTRASOUND ultrasound guided hysteroscopy;  Surgeon: AmNunzio CobbsMD;  [facility];  Laterality: N/A;  ultrasound guided hysteroscopy/D&C due to cervical stenosis   UPPER GASTROINTESTINAL ENDOSCOPY  11/23/2021    Social History   Socioeconomic History   Marital status: Legally Separated  Spouse name: Not on file   Number of children: 3   Years of education: Not on file   Highest education level: Not on file  Occupational History   Occupation: Caregiver  Tobacco Use   Smoking status: Never   Smokeless tobacco: Former    Types: Snuff    Quit date: 07/24/2004   Tobacco comments:    Age 29 quit, started at age 80  Vaping Use   Vaping Use: Never used  Substance and Sexual Activity   Alcohol use: Not Currently    Comment: 2 drinks   Drug use: Never    Sexual activity: Not Currently    Birth control/protection: None, Post-menopausal  Other Topics Concern   Not on file  Social History Narrative   Not on file   Social Determinants of Health   Financial Resource Strain: Low Risk  (05/13/2022)   Overall Financial Resource Strain (CARDIA)    Difficulty of Paying Living Expenses: Not hard at all  Food Insecurity: No Food Insecurity (05/13/2022)   Hunger Vital Sign    Worried About Running Out of Food in the Last Year: Never true    [place] in the Last Year: Never true  Transportation Needs: No Transportation Needs (05/13/2022)   PRAPARE - Hydrologist (Medical): No    Lack of Transportation (Non-Medical): No  Physical Activity: Insufficiently Active (05/13/2022)   Exercise Vital Sign    Days of Exercise per Week: 2 days    Minutes of Exercise per Session: 30 min  Stress: No Stress Concern Present (05/13/2022)   [place] of Stress : Not at all  Social Connections: Unknown (05/13/2022)   Social Connection and Isolation Panel [NHANES]    Frequency of Communication with Friends and Family: More than three times a week    Frequency of Social Gatherings with Friends and Family: More than three times a week    Attends Religious Services: More than 4 times per year    Active Member of [employer] or Organizations: Yes    Attends Archivist Meetings: More than 4 times per year    Marital Status: Not on file  Intimate Partner Violence: Not At Risk (05/13/2022)   Humiliation, Afraid, Rape, and Kick questionnaire    Fear of Current or Ex-Partner: No    Emotionally Abused: No    Physically Abused: No    Sexually Abused: No    Family History  Problem Relation Age of Onset   Hypertension Mother    Diabetes Mother    Arthritis Mother    Hyperlipidemia Mother    Kidney disease Mother    Alcoholism Mother    Lung cancer  Father    Heart attack Father    Heart disease Father    Alcoholism Father    Hypertension Sister    Breast cancer Sister    Hernia Sister    Diabetes Brother    Colon cancer Neg Hx    Esophageal cancer Neg Hx    Colon polyps Neg Hx    Rectal cancer Neg Hx    Stomach cancer Neg Hx      Review of Systems  Constitutional: Negative.  Negative for chills and fever.  HENT: Negative.  Negative for congestion and sore throat.   Respiratory: Negative.  Negative for cough and shortness of breath.   Cardiovascular: Negative.  Negative for chest pain and palpitations.  Gastrointestinal:  Negative for abdominal pain, nausea and vomiting.  Genitourinary: Negative.  Negative for dysuria and hematuria.  Skin: Negative.  Negative for rash.  Neurological: Negative.  Negative for dizziness and headaches.  All other systems reviewed and are negative.   Today's Vitals   05/29/22 1340  BP: 120/82  Pulse: 72  Temp: 98.2 F (36.8 C)  TempSrc: Oral  SpO2: 96%  Weight: 169 lb (76.7 kg)  Height: '5\' 5"'$  (1.651 m)   Body mass index is 28.12 kg/m. Wt Readings from Last 3 Encounters:  05/29/22 169 lb (76.7 kg)  01/09/22 182 lb 2 oz (82.6 kg)  11/23/21 189 lb (85.7 kg)   BP Readings from Last 3 Encounters:  05/29/22 120/82  01/09/22 120/70  11/23/21 123/70    Physical Exam Vitals reviewed.  Constitutional:      Appearance: Normal appearance.  HENT:     Head: Normocephalic.  Eyes:     Extraocular Movements: Extraocular movements intact.     Pupils: Pupils are equal, round, and reactive to light.  Cardiovascular:     Rate and Rhythm: Normal rate and regular rhythm.     Pulses: Normal pulses.     Heart sounds: Murmur heard.  Pulmonary:     Effort: Pulmonary effort is normal.     Breath sounds: Normal breath sounds.  Skin:    General: Skin is warm and dry.  Neurological:     General: No focal deficit present.     Mental Status: She is alert and oriented to person, place, and time.   Psychiatric:        Mood and Affect: Mood normal.        Behavior: Behavior normal.    Results for orders placed or performed in visit on 05/29/22 (from the past 24 hour(s))  CBC with Differential/Platelet     Status: Abnormal   Collection Time: 05/29/22  2:38 PM  Result Value Ref Range   WBC 6.5 4.0 - 10.5 K/uL   RBC 4.37 3.87 - 5.11 Mil/uL   Hemoglobin 12.4 12.0 - 15.0 g/dL   HCT 37.2 36.0 - 46.0 %   MCV 85.0 78.0 - 100.0 fl   MCHC 33.4 30.0 - 36.0 g/dL   RDW 15.9 (H) 11.5 - 15.5 %   Platelets 338.0 150.0 - 400.0 K/uL   Neutrophils Relative % 49.9 43.0 - 77.0 %   Lymphocytes Relative 39.6 12.0 - 46.0 %   Monocytes Relative 7.0 3.0 - 12.0 %   Eosinophils Relative 1.5 0.0 - 5.0 %   Basophils Relative 2.0 0.0 - 3.0 %   Neutro Abs 3.2 1.4 - 7.7 K/uL   Lymphs Abs 2.6 0.7 - 4.0 K/uL   Monocytes Absolute 0.5 0.1 - 1.0 K/uL   Eosinophils Absolute 0.1 0.0 - 0.7 K/uL   Basophils Absolute 0.1 0.0 - 0.1 K/uL  Comprehensive metabolic panel     Status: Abnormal   Collection Time: 05/29/22  2:38 PM  Result Value Ref Range   Sodium 141 135 - 145 mEq/L   Potassium 3.6 3.5 - 5.1 mEq/L   Chloride 103 96 - 112 mEq/L   CO2 29 19 - 32 mEq/L   Glucose, Bld 106 (H) 70 - 99 mg/dL   BUN 15 6 - 23 mg/dL   Creatinine, Ser 1.21 (H) 0.40 - 1.20 mg/dL   Total Bilirubin 0.6 0.2 - 1.2 mg/dL   Alkaline Phosphatase 70 39 - 117 U/L   AST 17 0 - 37 U/L  ALT 11 0 - 35 U/L   Total Protein 7.8 6.0 - 8.3 g/dL   Albumin 4.3 3.5 - 5.2 g/dL   GFR 46.72 (L) >60.00 mL/min   Calcium 9.8 8.4 - 10.5 mg/dL  Lipid panel     Status: None   Collection Time: 05/29/22  2:38 PM  Result Value Ref Range   Cholesterol 194 0 - 200 mg/dL   Triglycerides 105.0 0.0 - 149.0 mg/dL   HDL 83.00 >39.00 mg/dL   VLDL 21.0 0.0 - 40.0 mg/dL   LDL Cholesterol 90 0 - 99 mg/dL   Total CHOL/HDL Ratio 2    NonHDL 110.51   POCT HgB A1C     Status: Normal   Collection Time: 05/29/22  4:03 PM  Result Value Ref Range   Hemoglobin A1C  5.3 4.0 - 5.6 %   HbA1c POC (<> result, manual entry)     HbA1c, POC (prediabetic range)     HbA1c, POC (controlled diabetic range)        ASSESSMENT & PLAN: A total of 45 minutes was spent with the patient and counseling/coordination of care regarding preparing for this visit, review of most recent office visit notes, review of multiple chronic medical conditions and their management, review of most recent blood work results including interpretation of today's hemoglobin A1c, review of all medications, cardiovascular risks associated with diabetes and hypertension, education on nutrition, prognosis, documentation, and need for follow-up.  Problem List Items Addressed This Visit       Cardiovascular and Mediastinum   Hypertension associated with diabetes ([place]) - Primary    Well-controlled hypertension. Continue lisinopril 20 mg and amlodipine 10 mg daily. Well-controlled diabetes with hemoglobin A1c of 5.3 Continue weekly Ozempic 0.5 mg Cardiovascular risks associated with hypertension and diabetes discussed Diet and nutrition discussed Follow-up in 6 months.      Relevant Medications   lisinopril (ZESTRIL) 20 MG tablet   amLODipine (NORVASC) 10 MG tablet   Semaglutide,0.25 or 0.'5MG'$ /DOS, (OZEMPIC, 0.25 OR 0.5 MG/DOSE,) 2 MG/3ML SOPN   Other Relevant Orders   POCT HgB A1C   CBC with Differential/Platelet   Comprehensive metabolic panel   Lipid panel     Endocrine   Type 2 diabetes mellitus with other specified complication, unspecified whether long term insulin use (HCC)   Relevant Medications   lisinopril (ZESTRIL) 20 MG tablet   Semaglutide,0.25 or 0.'5MG'$ /DOS, (OZEMPIC, 0.25 OR 0.5 MG/DOSE,) 2 MG/3ML SOPN     Genitourinary   Stage 3b chronic kidney disease ([place])    Advised to stay well-hydrated and avoid NSAIDs        Other   Chronic anemia    Hemodynamically stable.  CBC done today.      Other Visit Diagnoses     Essential hypertension       Relevant  Medications   lisinopril (ZESTRIL) 20 MG tablet   amLODipine (NORVASC) 10 MG tablet      Patient Instructions  Diabetes Mellitus and Nutrition, Adult When you have diabetes, or diabetes mellitus, it is very important to have healthy eating habits because your blood sugar (glucose) levels are greatly affected by what you eat and drink. Eating healthy foods in the right amounts, at about the same times every day, can help you: Manage your blood glucose. Lower your risk of heart disease. Improve your blood pressure. Reach or maintain a healthy weight. What can affect my meal plan? Every person with diabetes is different, and each person has different needs for a  meal plan. Your health care provider may recommend that you work with a dietitian to make a meal plan that is best for you. Your meal plan may vary depending on factors such as: The calories you need. The medicines you take. Your weight. Your blood glucose, blood pressure, and cholesterol levels. Your activity level. Other health conditions you have, such as heart or kidney disease. How do carbohydrates affect me? Carbohydrates, also called carbs, affect your blood glucose level more than any other type of food. Eating carbs raises the amount of glucose in your blood. It is important to know how many carbs you can safely have in each meal. This is different for every person. Your dietitian can help you calculate how many carbs you should have at each meal and for each snack. How does alcohol affect me? Alcohol can cause a decrease in blood glucose (hypoglycemia), especially if you use insulin or take certain diabetes medicines by mouth. Hypoglycemia can be a life-threatening condition. Symptoms of hypoglycemia, such as sleepiness, dizziness, and confusion, are similar to symptoms of having too much alcohol. Do not drink alcohol if: Your health care provider tells you not to drink. You are pregnant, may be pregnant, or are planning to  become pregnant. If you drink alcohol: Limit how much you have to: 0-1 drink a day for women. 0-2 drinks a day for men. Know how much alcohol is in your drink. In the [place] one drink equals one 12 oz bottle of beer (355 mL), one 5 oz glass of wine (148 mL), or one 1 oz glass of hard liquor (44 mL). Keep yourself hydrated with water, diet soda, or unsweetened iced tea. Keep in mind that regular soda, juice, and other mixers may contain a lot of sugar and must be counted as carbs. What are tips for following this plan?  Reading food labels Start by checking the serving size on the Nutrition Facts label of packaged foods and drinks. The number of calories and the amount of carbs, fats, and other nutrients listed on the label are based on one serving of the item. Many items contain more than one serving per package. Check the total grams (g) of carbs in one serving. Check the number of grams of saturated fats and trans fats in one serving. Choose foods that have a low amount or none of these fats. Check the number of milligrams (mg) of salt (sodium) in one serving. Most people should limit total sodium intake to less than 2,300 mg per day. Always check the nutrition information of foods labeled as "low-fat" or "nonfat." These foods may be higher in added sugar or refined carbs and should be avoided. Talk to your dietitian to identify your daily goals for nutrients listed on the label. Shopping Avoid buying canned, pre-made, or processed foods. These foods tend to be high in fat, sodium, and added sugar. Shop around the outside edge of the grocery store. This is where you will most often find fresh fruits and vegetables, bulk grains, fresh meats, and fresh dairy products. Cooking Use low-heat cooking methods, such as baking, instead of high-heat cooking methods, such as deep frying. Cook using healthy oils, such as olive, canola, or sunflower oil. Avoid cooking with butter, cream, or high-fat  meats. Meal planning Eat meals and snacks regularly, preferably at the same times every day. Avoid going long periods of time without eating. Eat foods that are high in fiber, such as fresh fruits, vegetables, beans, and whole grains. Eat 4-6  oz (112-168 g) of lean protein each day, such as lean meat, chicken, fish, eggs, or tofu. One ounce (oz) (28 g) of lean protein is equal to: 1 oz (28 g) of meat, chicken, or fish. 1 egg.  cup (62 g) of tofu. Eat some foods each day that contain healthy fats, such as avocado, nuts, seeds, and fish. What foods should I eat? Fruits Berries. Apples. Oranges. Peaches. Apricots. Plums. Grapes. Mangoes. Papayas. Pomegranates. Kiwi. Cherries. Vegetables Leafy greens, including lettuce, spinach, kale, chard, collard greens, mustard greens, and cabbage. Beets. Cauliflower. Broccoli. Carrots. Green beans. Tomatoes. Peppers. Onions. Cucumbers. Brussels sprouts. Grains Whole grains, such as whole-wheat or whole-grain bread, crackers, tortillas, cereal, and pasta. Unsweetened oatmeal. Quinoa. Brown or wild rice. Meats and other proteins Seafood. Poultry without skin. Lean cuts of poultry and beef. Tofu. Nuts. Seeds. Dairy Low-fat or fat-free dairy products such as milk, yogurt, and cheese. The items listed above may not be a complete list of foods and beverages you can eat and drink. Contact a dietitian for more information. What foods should I avoid? Fruits Fruits canned with syrup. Vegetables Canned vegetables. Frozen vegetables with butter or cream sauce. Grains Refined white flour and flour products such as bread, pasta, snack foods, and cereals. Avoid all processed foods. Meats and other proteins Fatty cuts of meat. Poultry with skin. Breaded or fried meats. Processed meat. Avoid saturated fats. Dairy Full-fat yogurt, cheese, or milk. Beverages Sweetened drinks, such as soda or iced tea. The items listed above may not be a complete list of foods and  beverages you should avoid. Contact a dietitian for more information. Questions to ask a health care provider Do I need to meet with a certified diabetes care and education specialist? Do I need to meet with a dietitian? What number can I call if I have questions? When are the best times to check my blood glucose? Where to find more information: American Diabetes Association: diabetes.org Academy of Nutrition and Dietetics: eatright.[employer] of Diabetes and Digestive and Kidney Diseases: [employer]is Association of Diabetes Care & Education Specialists: diabeteseducator.org Summary It is important to have healthy eating habits because your blood sugar (glucose) levels are greatly affected by what you eat and drink. It is important to use alcohol carefully. A healthy meal plan will help you manage your blood glucose and lower your risk of heart disease. Your health care provider may recommend that you work with a dietitian to make a meal plan that is best for you. This information is not intended to replace advice given to you by your health care provider. Make sure you discuss any questions you have with your health care provider. Document Revised: 12/29/2019 Document Reviewed: 12/29/2019 Elsevier Patient Education  [place], MD [facility]

## 2022-05-29 NOTE — Assessment & Plan Note (Signed)
Well-controlled hypertension. Continue lisinopril 20 mg and amlodipine 10 mg daily. Well-controlled diabetes with hemoglobin A1c of 5.3 Continue weekly Ozempic 0.5 mg Cardiovascular risks associated with hypertension and diabetes discussed Diet and nutrition discussed Follow-up in 6 months.

## 2022-05-29 NOTE — Assessment & Plan Note (Signed)
Advised to stay well-hydrated and avoid NSAIDs. ?

## 2022-05-29 NOTE — Assessment & Plan Note (Signed)
Hemodynamically stable.  CBC done today.

## 2022-05-29 NOTE — Patient Instructions (Signed)

## 2022-06-06 ENCOUNTER — Telehealth: Payer: Self-pay | Admitting: Emergency Medicine

## 2022-06-06 DIAGNOSIS — I10 Essential (primary) hypertension: Secondary | ICD-10-CM

## 2022-06-06 MED ORDER — LISINOPRIL 20 MG PO TABS: ORAL_TABLET | ORAL | 3 refills | Status: DC

## 2022-06-06 NOTE — Telephone Encounter (Signed)
Spoke to pt sending medication to requested pharmacy.

## 2022-06-06 NOTE — Telephone Encounter (Signed)
Patient called and stated she needs a refill on her lisinopril (zestril) '20mg'$ , she would like it sent to the [facility] in [place] at [facility].

## 2022-07-02 ENCOUNTER — Encounter: Payer: Self-pay | Admitting: Emergency Medicine

## 2022-07-02 ENCOUNTER — Other Ambulatory Visit: Payer: Self-pay | Admitting: Emergency Medicine

## 2022-07-02 DIAGNOSIS — I152 Hypertension secondary to endocrine disorders: Secondary | ICD-10-CM

## 2022-07-02 MED ORDER — SEMAGLUTIDE (1 MG/DOSE) 4 MG/3ML ~~LOC~~ SOPN: 1.0000 mg | PEN_INJECTOR | SUBCUTANEOUS | 5 refills | Status: DC

## 2022-07-02 NOTE — Telephone Encounter (Signed)
Okay to increase dose to 1 mg weekly.  We will send a new prescription but in the meantime she can use 2 doses of 0.5 mg at the same time.  Thanks.

## 2022-08-05 ENCOUNTER — Other Ambulatory Visit: Payer: Self-pay | Admitting: Emergency Medicine

## 2022-08-05 ENCOUNTER — Telehealth: Payer: Self-pay | Admitting: Emergency Medicine

## 2022-08-05 DIAGNOSIS — I152 Hypertension secondary to endocrine disorders: Secondary | ICD-10-CM

## 2022-08-05 DIAGNOSIS — I10 Essential (primary) hypertension: Secondary | ICD-10-CM

## 2022-08-05 MED ORDER — SEMAGLUTIDE (1 MG/DOSE) 4 MG/3ML ~~LOC~~ SOPN: 1.0000 mg | PEN_INJECTOR | SUBCUTANEOUS | 5 refills | Status: DC

## 2022-08-05 NOTE — Telephone Encounter (Signed)
Patient is requesting a refill of Ozempic, but I am not seeing it on her medications list. If possible, please send to [place], [place] callback number is [phone].

## 2022-08-05 NOTE — Telephone Encounter (Signed)
New prescription sent to patient requested pharmacy  

## 2022-08-12 ENCOUNTER — Other Ambulatory Visit: Payer: Self-pay | Admitting: *Deleted

## 2022-08-12 DIAGNOSIS — I10 Essential (primary) hypertension: Secondary | ICD-10-CM

## 2022-08-12 MED ORDER — LISINOPRIL 20 MG PO TABS: ORAL_TABLET | ORAL | 0 refills | Status: DC

## 2022-08-20 NOTE — Progress Notes (Signed)
This encounter was created in error - please disregard.

## 2022-09-11 ENCOUNTER — Other Ambulatory Visit: Payer: Self-pay | Admitting: Emergency Medicine

## 2022-09-11 DIAGNOSIS — I10 Essential (primary) hypertension: Secondary | ICD-10-CM

## 2022-11-05 ENCOUNTER — Telehealth: Payer: Self-pay

## 2022-11-05 DIAGNOSIS — E1159 Type 2 diabetes mellitus with other circulatory complications: Secondary | ICD-10-CM

## 2022-11-05 MED ORDER — SEMAGLUTIDE (1 MG/DOSE) 4 MG/3ML ~~LOC~~ SOPN: 1.0000 mg | PEN_INJECTOR | SUBCUTANEOUS | 0 refills | Status: DC

## 2022-11-05 NOTE — Telephone Encounter (Signed)
Refill sent to [facility]

## 2022-11-05 NOTE — Telephone Encounter (Signed)
Pt has called and asked that a refill for her    Semaglutide, 1 MG/DOSE, 4 MG/3ML SOPN  Be sent in as she is completley out.

## 2022-11-05 NOTE — Addendum Note (Signed)
Addended by: Deatra James on: 11/05/2022 04:26 PM   Modules accepted: Orders

## 2022-11-19 ENCOUNTER — Other Ambulatory Visit: Payer: Self-pay | Admitting: Emergency Medicine

## 2022-11-19 ENCOUNTER — Telehealth: Payer: Self-pay | Admitting: Emergency Medicine

## 2022-11-19 DIAGNOSIS — I152 Hypertension secondary to endocrine disorders: Secondary | ICD-10-CM

## 2022-11-19 DIAGNOSIS — D649 Anemia, unspecified: Secondary | ICD-10-CM

## 2022-11-19 DIAGNOSIS — N1832 Chronic kidney disease, stage 3b: Secondary | ICD-10-CM

## 2022-11-19 NOTE — Telephone Encounter (Signed)
Called patient and informed her that the labs are ordered

## 2022-11-19 NOTE — Telephone Encounter (Signed)
Future orders for labs placed today.  Thanks.

## 2022-11-19 NOTE — Telephone Encounter (Signed)
Patient has an appointment scheduled for 11/27/2022. She would like to know if she can have blood work done prior to that appointment. Best callback is [phone].

## 2022-11-20 ENCOUNTER — Other Ambulatory Visit (INDEPENDENT_AMBULATORY_CARE_PROVIDER_SITE_OTHER): Payer: Medicare Other

## 2022-11-20 DIAGNOSIS — I152 Hypertension secondary to endocrine disorders: Secondary | ICD-10-CM

## 2022-11-20 DIAGNOSIS — N1832 Chronic kidney disease, stage 3b: Secondary | ICD-10-CM

## 2022-11-20 DIAGNOSIS — D649 Anemia, unspecified: Secondary | ICD-10-CM

## 2022-11-20 DIAGNOSIS — E1159 Type 2 diabetes mellitus with other circulatory complications: Secondary | ICD-10-CM

## 2022-11-20 LAB — CBC WITH DIFFERENTIAL/PLATELET
Basophils Absolute: 0 10*3/uL (ref 0.0–0.1)
Basophils Relative: 0.5 % (ref 0.0–3.0)
Eosinophils Absolute: 0.1 10*3/uL (ref 0.0–0.7)
Eosinophils Relative: 1.9 % (ref 0.0–5.0)
HCT: 35 % — ABNORMAL LOW (ref 36.0–46.0)
Hemoglobin: 11.4 g/dL — ABNORMAL LOW (ref 12.0–15.0)
Lymphocytes Relative: 44.7 % (ref 12.0–46.0)
Lymphs Abs: 2.5 10*3/uL (ref 0.7–4.0)
MCHC: 32.4 g/dL (ref 30.0–36.0)
MCV: 85.5 fl (ref 78.0–100.0)
Monocytes Absolute: 0.5 10*3/uL (ref 0.1–1.0)
Monocytes Relative: 8.2 % (ref 3.0–12.0)
Neutro Abs: 2.5 10*3/uL (ref 1.4–7.7)
Neutrophils Relative %: 44.7 % (ref 43.0–77.0)
Platelets: 336 10*3/uL (ref 150.0–400.0)
RBC: 4.1 Mil/uL (ref 3.87–5.11)
RDW: 14.5 % (ref 11.5–15.5)
WBC: 5.6 10*3/uL (ref 4.0–10.5)

## 2022-11-20 LAB — LIPID PANEL
Cholesterol: 178 mg/dL (ref 0–200)
HDL: 74.6 mg/dL (ref >39.00)
LDL Cholesterol: 94 mg/dL (ref 0–99)
NonHDL: 103.05
Total CHOL/HDL Ratio: 2
Triglycerides: 43 mg/dL (ref 0.0–149.0)
VLDL: 8.6 mg/dL (ref 0.0–40.0)

## 2022-11-20 LAB — COMPREHENSIVE METABOLIC PANEL
ALT: 10 U/L (ref 0–35)
AST: 16 U/L (ref 0–37)
Albumin: 4.1 g/dL (ref 3.5–5.2)
Alkaline Phosphatase: 61 U/L (ref 39–117)
BUN: 21 mg/dL (ref 6–23)
CO2: 27 mEq/L (ref 19–32)
Calcium: 9.4 mg/dL (ref 8.4–10.5)
Chloride: 103 mEq/L (ref 96–112)
Creatinine, Ser: 1.17 mg/dL (ref 0.40–1.20)
GFR: 48.48 mL/min — ABNORMAL LOW (ref >60.00)
Glucose, Bld: 95 mg/dL (ref 70–99)
Potassium: 3.7 mEq/L (ref 3.5–5.1)
Sodium: 138 mEq/L (ref 135–145)
Total Bilirubin: 0.6 mg/dL (ref 0.2–1.2)
Total Protein: 7.7 g/dL (ref 6.0–8.3)

## 2022-11-20 LAB — HEMOGLOBIN A1C: Hgb A1c MFr Bld: 5.6 % (ref 4.6–6.5)

## 2022-11-27 ENCOUNTER — Ambulatory Visit (INDEPENDENT_AMBULATORY_CARE_PROVIDER_SITE_OTHER): Payer: Medicare Other | Admitting: Emergency Medicine

## 2022-11-27 ENCOUNTER — Encounter: Payer: Self-pay | Admitting: Emergency Medicine

## 2022-11-27 VITALS — BP 126/84 | HR 72 | Temp 98.0°F | Ht 65.0 in | Wt 156.0 lb

## 2022-11-27 DIAGNOSIS — N1831 Chronic kidney disease, stage 3a: Secondary | ICD-10-CM

## 2022-11-27 DIAGNOSIS — E1159 Type 2 diabetes mellitus with other circulatory complications: Secondary | ICD-10-CM | POA: Diagnosis not present

## 2022-11-27 DIAGNOSIS — I152 Hypertension secondary to endocrine disorders: Secondary | ICD-10-CM

## 2022-11-27 DIAGNOSIS — Z7984 Long term (current) use of oral hypoglycemic drugs: Secondary | ICD-10-CM | POA: Diagnosis not present

## 2022-11-27 LAB — MICROALBUMIN / CREATININE URINE RATIO
Creatinine,U: 126 mg/dL
Microalb Creat Ratio: 0.7 mg/g (ref 0.0–30.0)
Microalb, Ur: 0.9 mg/dL (ref 0.0–1.9)

## 2022-11-27 NOTE — Patient Instructions (Signed)
Chronic Kidney Disease, Adult Chronic kidney disease is when lasting damage happens to the kidneys slowly over a long time. The kidneys help to: Make pee (urine). Make hormones. Keep the right amount of fluids and chemicals in the body. Most often, this disease does not go away. You must take steps to help keep the kidney damage from getting worse. If steps are not taken, the kidneys might stop working forever. What are the causes? Diabetes. High blood pressure. Diseases that affect the heart and blood vessels. Other kidney diseases. Diseases of the body's disease-fighting system. A problem with the flow of pee. Infections of the organs that make pee, store it, and take it out of the body. Swelling or irritation of your blood vessels. What increases the risk? Getting older. Having someone in your family who has kidney disease or kidney failure. Having a disease caused by genes. Taking medicines often that harm the kidneys. Being near or having contact with harmful substances. Being very overweight. Using tobacco now or in the past. What are the signs or symptoms? Feeling very tired. Having a swollen face, legs, ankles, or feet. Feeling like you may vomit or vomiting. Not feeling hungry. Being confused or not able to focus. Twitches and cramps in the leg muscles or other muscles. Dry, itchy skin. A taste of metal in your mouth. Making less pee, or making more pee. Shortness of breath. Trouble sleeping. You may also become anemic or get weak bones. Anemic means there is not enough red blood cells or hemoglobin in your blood. You may get symptoms slowly. You may not notice them until the kidney damage gets very bad. How is this treated? Often, there is no cure for this disease. Treatment can help with symptoms and help keep the disease from getting worse. You may need to: Avoid alcohol. Avoid foods that are high in salt, potassium, phosphorous, and protein. Take medicines for  symptoms and to help control other conditions. Have dialysis. This treatment gets harmful waste out of your body. Treat other problems that cause your kidney disease or make it worse. Follow these instructions at home: Medicines Take over-the-counter and prescription medicines only as told by your doctor. Do not take any new medicines, vitamins, or supplements unless your doctor says it is okay. Lifestyle  Do not smoke or use any products that contain nicotine or tobacco. If you need help quitting, ask your doctor. If you drink alcohol: Limit how much you use to: 0-1 drink a day for women who are not pregnant. 0-2 drinks a day for men. Know how much alcohol is in your drink. In the [place] one drink equals one 12 oz bottle of beer (355 mL), one 5 oz glass of wine (148 mL), or one 1 oz glass of hard liquor (44 mL). Stay at a healthy weight. If you need help losing weight, ask your doctor. General instructions  Follow instructions from your doctor about what you cannot eat or drink. Track your blood pressure at home. Tell your doctor about any changes. If you have diabetes, track your blood sugar. Exercise at least 30 minutes a day, 5 days a week. Keep your shots (vaccinations) up to date. Keep all follow-up visits. Where to find more information American Association of Kidney Patients: www.aakp.org National Kidney Foundation: www.kidney.org American Kidney Fund: www.akfinc.org Life Options: www.lifeoptions.org Kidney School: www.kidneyschool.org Contact a doctor if: Your symptoms get worse. You get new symptoms. Get help right away if: You get symptoms of end-stage kidney disease. These   include: Headaches. Losing feeling in your hands or feet. Easy bruising. Having hiccups often. Chest pain. Shortness of breath. Lack of menstrual periods, in women. You have a fever. You make less pee than normal. You have pain or you bleed when you pee or poop. These symptoms may be an  emergency. Get help right away. Call your local emergency services (911 in the [place] Do not wait to see if the symptoms will go away. Do not drive yourself to the hospital. Summary Chronic kidney disease is when lasting damage happens to the kidneys slowly over a long time. Causes of this disease include diabetes and high blood pressure. Often, there is no cure for this disease. Treatment can help symptoms and help keep the disease from getting worse. Treatment may involve lifestyle changes, medicines, and dialysis. This information is not intended to replace advice given to you by your health care provider. Make sure you discuss any questions you have with your health care provider. Document Revised: 09/01/2019 Document Reviewed: 09/01/2019 Elsevier Patient Education  2024 Elsevier Inc.  

## 2022-11-27 NOTE — Assessment & Plan Note (Signed)
Chronic and stable. GFR improved Will check urine for microalbuminuria May benefit from [place] Advised to stay well-hydrated and avoid NSAIDs

## 2022-11-27 NOTE — Progress Notes (Signed)
Emily Lopez 67 y.o.   Chief Complaint  Patient presents with   Medical Management of Chronic Issues    f/u appt, no concerns     HISTORY OF PRESENT ILLNESS: This is a 68 y.o. female A1A here for 49-month follow-up of chronic medical conditions including hypertension, diabetes, and chronic kidney disease Overall doing well.  Has no complaints or medical concerns today. BP Readings from Last 3 Encounters:  11/27/22 126/84  05/29/22 120/82  01/09/22 120/70   Wt Readings from Last 3 Encounters:  11/27/22 156 lb (70.8 kg)  05/29/22 169 lb (76.7 kg)  01/09/22 182 lb 2 oz (82.6 kg)     HPI   Prior to Admission medications   Medication Sig Start Date End Date Taking? Authorizing Provider  amLODipine (NORVASC) 10 MG tablet TAKE 1 TABLET DAILY 08/05/22  Yes Tresean Mattix, Eilleen Kempf, MD  cholecalciferol (VITAMIN D3) 25 MCG (1000 UNIT) tablet Take 1 tablet (1,000 Units total) by mouth daily. 01/09/22  Yes Imogene Burn, MD  lisinopril (ZESTRIL) 20 MG tablet TAKE 1 TABLET(20 MG) BY MOUTH DAILY 09/11/22  Yes Kimberlea Schlag, [place], MD  Semaglutide, 1 MG/DOSE, 4 MG/3ML SOPN Inject 1 mg as directed once a week. 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 08/07/2006   Hypertension  associated with diabetes (HCC) 08/07/2006    Past Medical History:  Diagnosis Date   Anxiety    Arthritis    Attention deficit disorder without mention of hyperactivity    Depression    Diabetes (HCC)    Edema of both lower extremities    Esophageal reflux    High blood sugar    High cholesterol    Hypertension    Iron deficiency anemia, unspecified    Myalgia and myositis, unspecified    Pneumonia    2008 or 2009   Pre-diabetes    Undiagnosed cardiac murmurs    Vitamin B12 deficiency    Vitamin D deficiency     Past Surgical History:  Procedure Laterality Date   COLONOSCOPY  11/23/2021   DILATATION & CURETTAGE/HYSTEROSCOPY WITH MYOSURE N/A 07/14/2018   Procedure: DILATATION & CURETTAGE/HYSTEROSCOPY with ultrasound guidance;  Surgeon: Patton Salles, MD;  Location: [facility] [place];  Service: Gynecology;  Laterality: N/A;  ultrasound guidance needed. Follow previous case.   OPERATIVE ULTRASOUND N/A 07/14/2018   Procedure: OPERATIVE ULTRASOUND ultrasound guided hysteroscopy;  Surgeon: Patton Salles, MD;  Location: [facility];  Service: Gynecology;  Laterality: N/A;  ultrasound guided hysteroscopy/D&C due to cervical stenosis   UPPER GASTROINTESTINAL ENDOSCOPY  11/23/2021    Social History   Socioeconomic History   Marital status: Legally Separated    Spouse name: Not on file   Number of children: 3  Years of education: Not on file   Highest education level: Not on file  Occupational History   Occupation: Caregiver  Tobacco Use   Smoking status: Never   Smokeless tobacco: Former    Types: Snuff    Quit date: 07/24/2004   Tobacco comments:    Age 22 quit, started at age 52  Vaping Use   Vaping Use: Never used  Substance and Sexual Activity   Alcohol use: Not Currently    Comment: 2 drinks   Drug use: Never   Sexual activity: Not Currently    Birth control/protection: None, Post-menopausal  Other Topics Concern    Not on file  Social History Narrative   Not on file   Social Determinants of Health   Financial Resource Strain: Low Risk  (05/13/2022)   Overall Financial Resource Strain (CARDIA)    Difficulty of Paying Living Expenses: Not hard at all  Food Insecurity: No Food Insecurity (05/13/2022)   Hunger Vital Sign    Worried About Running Out of Food in the Last Year: Never true    Ran Out of Food in the Last Year: Never true  Transportation Needs: No Transportation Needs (05/13/2022)   PRAPARE - Administrator, Civil Service (Medical): No    Lack of Transportation (Non-Medical): No  Physical Activity: Insufficiently Active (05/13/2022)   Exercise Vital Sign    Days of Exercise per Week: 2 days    Minutes of Exercise per Session: 30 min  Stress: No Stress Concern Present (05/13/2022)   [employer] of Occupational Health - Occupational Stress Questionnaire    Feeling of Stress : Not at all  Social Connections: Unknown (05/13/2022)   Social Connection and Isolation Panel [NHANES]    Frequency of Communication with Friends and Family: More than three times a week    Frequency of Social Gatherings with Friends and Family: More than three times a week    Attends Religious Services: More than 4 times per year    Active Member of [employer] or Organizations: Yes    Attends Banker Meetings: More than 4 times per year    Marital Status: Not on file  Intimate Partner Violence: Not At Risk (05/13/2022)   Humiliation, Afraid, Rape, and Kick questionnaire    Fear of Current or Ex-Partner: No    Emotionally Abused: No    Physically Abused: No    Sexually Abused: No    Family History  Problem Relation Age of Onset   Hypertension Mother    Diabetes Mother    Arthritis Mother    Hyperlipidemia Mother    Kidney disease Mother    Alcoholism Mother    Lung cancer Father    Heart attack Father    Heart disease Father    Alcoholism Father    Hypertension Sister    Breast  cancer Sister    Hernia Sister    Diabetes Brother    Colon cancer Neg Hx    Esophageal cancer Neg Hx    Colon polyps Neg Hx    Rectal cancer Neg Hx    Stomach cancer Neg Hx      Review of Systems  Constitutional: Negative.  Negative for chills and fever.  HENT: Negative.  Negative for congestion and sore throat.   Respiratory: Negative.  Negative for cough and shortness of breath.   Cardiovascular: Negative.  Negative for chest pain and palpitations.  Gastrointestinal:  Negative for abdominal pain, nausea and vomiting.  Genitourinary: Negative.  Negative for dysuria and hematuria.  Skin: Negative.  Negative for rash.  Neurological: Negative.  Negative for dizziness and headaches.  All other systems reviewed and are negative.   Vitals:   11/27/22 1508  BP: 126/84  Pulse: 72  Temp: 98 F (36.7 C)  SpO2: 98%    Physical Exam Vitals reviewed.  Constitutional:      Appearance: Normal appearance.  HENT:     Head: Normocephalic.     Mouth/Throat:     Mouth: Mucous membranes are moist.     Pharynx: Oropharynx is clear.  Eyes:     Extraocular Movements: Extraocular movements intact.  Cardiovascular:     Rate and Rhythm: Normal rate and regular rhythm.     Pulses: Normal pulses.     Heart sounds: Normal heart sounds.  Pulmonary:     Effort: Pulmonary effort is normal.     Breath sounds: Normal breath sounds.  Skin:    General: Skin is warm and dry.  Neurological:     Mental Status: She is alert and oriented to person, place, and time.  Psychiatric:        Mood and Affect: Mood normal.        Behavior: Behavior normal.    Lab Results  Component Value Date   HGBA1C 5.6 11/20/2022     ASSESSMENT & PLAN: A total of 42 minutes was spent with the patient and counseling/coordination of care regarding preparing for this visit, review of most recent office visit notes, review of most recent blood work results, review of multiple chronic medical conditions and their  management, review of all medications, cardiovascular risks associated with hypertension and diabetes, education on nutrition, prognosis, documentation and need for follow-up.   Problem List Items Addressed This Visit       Cardiovascular and Mediastinum   Hypertension associated with diabetes (HCC) - Primary    Well-controlled hypertension Continue amlodipine 10 mg and lisinopril 20 mg daily Well-controlled diabetes with hemoglobin A1c of 5.6 Continue semaglutide 1 mg weekly Cardiovascular risks associated with hypertension and diabetes discussed Diet and nutrition discussed Follow-up in 6 months      Relevant Orders   Urine Microalbumin w/creat. ratio     Genitourinary   Stage 3b chronic kidney disease (HCC)    Chronic and stable. GFR improved Will check urine for microalbuminuria May benefit from [place] Advised to stay well-hydrated and avoid NSAIDs      Patient Instructions  Chronic Kidney Disease, Adult Chronic kidney disease is when lasting damage happens to the kidneys slowly over a long time. The kidneys help to: Make pee (urine). Make hormones. Keep the right amount of fluids and chemicals in the body. Most often, this disease does not go away. You must take steps to help keep the kidney damage from getting worse. If steps are not taken, the kidneys might stop working forever. What are the causes? Diabetes. High blood pressure. Diseases that affect the heart and blood vessels. Other kidney diseases. Diseases of the body's disease-fighting system. A problem with the flow of pee. Infections of the organs that make pee, store it, and take it out of the body. Swelling or irritation of your blood vessels. What increases the risk? Getting older. Having someone in your family who has kidney disease or kidney failure. Having a disease caused by genes. Taking medicines often that harm the kidneys. Being near or having contact with harmful substances. Being very  overweight. Using tobacco now or in the past. What  are the signs or symptoms? Feeling very tired. Having a swollen face, legs, ankles, or feet. Feeling like you may vomit or vomiting. Not feeling hungry. Being confused or not able to focus. Twitches and cramps in the leg muscles or other muscles. Dry, itchy skin. A taste of metal in your mouth. Making less pee, or making more pee. Shortness of breath. Trouble sleeping. You may also become anemic or get weak bones. Anemic means there is not enough red blood cells or hemoglobin in your blood. You may get symptoms slowly. You may not notice them until the kidney damage gets very bad. How is this treated? Often, there is no cure for this disease. Treatment can help with symptoms and help keep the disease from getting worse. You may need to: Avoid alcohol. Avoid foods that are high in salt, potassium, phosphorous, and protein. Take medicines for symptoms and to help control other conditions. Have dialysis. This treatment gets harmful waste out of your body. Treat other problems that cause your kidney disease or make it worse. Follow these instructions at home: Medicines Take over-the-counter and prescription medicines only as told by your doctor. Do not take any new medicines, vitamins, or supplements unless your doctor says it is okay. Lifestyle  Do not smoke or use any products that contain nicotine or tobacco. If you need help quitting, ask your doctor. If you drink alcohol: Limit how much you use to: 0-1 drink a day for women who are not pregnant. 0-2 drinks a day for men. Know how much alcohol is in your drink. In the [place] one drink equals one 12 oz bottle of beer (355 mL), one 5 oz glass of wine (148 mL), or one 1 oz glass of hard liquor (44 mL). Stay at a healthy weight. If you need help losing weight, ask your doctor. General instructions  Follow instructions from your doctor about what you cannot eat or drink. Track your  blood pressure at home. Tell your doctor about any changes. If you have diabetes, track your blood sugar. Exercise at least 30 minutes a day, 5 days a week. Keep your shots (vaccinations) up to date. Keep all follow-up visits. Where to find more information American Association of Kidney Patients: ResidentialShow.is [employer]: www.kidney.org American Kidney Fund: FightingMatch.com.ee Life Options: www.lifeoptions.org Kidney School: www.kidneyschool.org Contact a doctor if: Your symptoms get worse. You get new symptoms. Get help right away if: You get symptoms of end-stage kidney disease. These include: Headaches. Losing feeling in your hands or feet. Easy bruising. Having hiccups often. Chest pain. Shortness of breath. Lack of menstrual periods, in women. You have a fever. You make less pee than normal. You have pain or you bleed when you pee or poop. These symptoms may be an emergency. Get help right away. Call your local emergency services (911 in the [place] Do not wait to see if the symptoms will go away. Do not drive yourself to the hospital. Summary Chronic kidney disease is when lasting damage happens to the kidneys slowly over a long time. Causes of this disease include diabetes and high blood pressure. Often, there is no cure for this disease. Treatment can help symptoms and help keep the disease from getting worse. Treatment may involve lifestyle changes, medicines, and dialysis. This information is not intended to replace advice given to you by your health care provider. Make sure you discuss any questions you have with your health care provider. Document Revised: 09/01/2019 Document Reviewed: 09/01/2019 Elsevier Patient Education  2024 Elsevier Inc.     Edwina Barth, MD [facility]

## 2022-11-27 NOTE — Assessment & Plan Note (Signed)
Well-controlled hypertension Continue amlodipine 10 mg and lisinopril 20 mg daily Well-controlled diabetes with hemoglobin A1c of 5.6 Continue semaglutide 1 mg weekly Cardiovascular risks associated with hypertension and diabetes discussed Diet and nutrition discussed Follow-up in 6 months

## 2023-03-11 ENCOUNTER — Other Ambulatory Visit: Payer: Self-pay | Admitting: Emergency Medicine

## 2023-03-11 DIAGNOSIS — I10 Essential (primary) hypertension: Secondary | ICD-10-CM

## 2023-05-07 DIAGNOSIS — Z1231 Encounter for screening mammogram for malignant neoplasm of breast: Secondary | ICD-10-CM | POA: Diagnosis not present

## 2023-05-07 LAB — HM MAMMOGRAPHY

## 2023-05-28 ENCOUNTER — Ambulatory Visit: Payer: TRICARE For Life (TFL) | Admitting: Emergency Medicine

## 2023-05-29 ENCOUNTER — Encounter: Payer: Self-pay | Admitting: Emergency Medicine

## 2023-05-29 ENCOUNTER — Ambulatory Visit (INDEPENDENT_AMBULATORY_CARE_PROVIDER_SITE_OTHER): Payer: Medicare Other | Admitting: Emergency Medicine

## 2023-05-29 VITALS — BP 138/82 | HR 79 | Temp 98.6°F | Ht 65.0 in | Wt 156.0 lb

## 2023-05-29 DIAGNOSIS — N1832 Chronic kidney disease, stage 3b: Secondary | ICD-10-CM

## 2023-05-29 DIAGNOSIS — I152 Hypertension secondary to endocrine disorders: Secondary | ICD-10-CM

## 2023-05-29 DIAGNOSIS — E1159 Type 2 diabetes mellitus with other circulatory complications: Secondary | ICD-10-CM

## 2023-05-29 DIAGNOSIS — Z7985 Long-term (current) use of injectable non-insulin antidiabetic drugs: Secondary | ICD-10-CM

## 2023-05-29 LAB — LIPID PANEL
Cholesterol: 205 mg/dL — ABNORMAL HIGH (ref 0–200)
HDL: 95.3 mg/dL (ref >39.00)
LDL Cholesterol: 86 mg/dL (ref 0–99)
NonHDL: 109.59
Total CHOL/HDL Ratio: 2
Triglycerides: 119 mg/dL (ref 0.0–149.0)
VLDL: 23.8 mg/dL (ref 0.0–40.0)

## 2023-05-29 LAB — CBC WITH DIFFERENTIAL/PLATELET
Basophils Absolute: 0 10*3/uL (ref 0.0–0.1)
Basophils Relative: 0.6 % (ref 0.0–3.0)
Eosinophils Absolute: 0.1 10*3/uL (ref 0.0–0.7)
Eosinophils Relative: 1.5 % (ref 0.0–5.0)
HCT: 37.2 % (ref 36.0–46.0)
Hemoglobin: 12.3 g/dL (ref 12.0–15.0)
Lymphocytes Relative: 37.6 % (ref 12.0–46.0)
Lymphs Abs: 2 10*3/uL (ref 0.7–4.0)
MCHC: 33 g/dL (ref 30.0–36.0)
MCV: 86 fL (ref 78.0–100.0)
Monocytes Absolute: 0.4 10*3/uL (ref 0.1–1.0)
Monocytes Relative: 6.9 % (ref 3.0–12.0)
Neutro Abs: 2.9 10*3/uL (ref 1.4–7.7)
Neutrophils Relative %: 53.4 % (ref 43.0–77.0)
Platelets: 304 10*3/uL (ref 150.0–400.0)
RBC: 4.33 Mil/uL (ref 3.87–5.11)
RDW: 15.2 % (ref 11.5–15.5)
WBC: 5.4 10*3/uL (ref 4.0–10.5)

## 2023-05-29 LAB — MICROALBUMIN / CREATININE URINE RATIO
Creatinine,U: 160.2 mg/dL
Microalb Creat Ratio: 2.1 mg/g (ref 0.0–30.0)
Microalb, Ur: 3.3 mg/dL — ABNORMAL HIGH (ref 0.0–1.9)

## 2023-05-29 LAB — COMPREHENSIVE METABOLIC PANEL
ALT: 11 U/L (ref 0–35)
AST: 18 U/L (ref 0–37)
Albumin: 4.4 g/dL (ref 3.5–5.2)
Alkaline Phosphatase: 60 U/L (ref 39–117)
BUN: 17 mg/dL (ref 6–23)
CO2: 30 meq/L (ref 19–32)
Calcium: 9.9 mg/dL (ref 8.4–10.5)
Chloride: 102 meq/L (ref 96–112)
Creatinine, Ser: 1.39 mg/dL — ABNORMAL HIGH (ref 0.40–1.20)
GFR: 39.28 mL/min — ABNORMAL LOW (ref >60.00)
Glucose, Bld: 109 mg/dL — ABNORMAL HIGH (ref 70–99)
Potassium: 3.5 meq/L (ref 3.5–5.1)
Sodium: 140 meq/L (ref 135–145)
Total Bilirubin: 0.9 mg/dL (ref 0.2–1.2)
Total Protein: 7.7 g/dL (ref 6.0–8.3)

## 2023-05-29 LAB — HEMOGLOBIN A1C: Hgb A1c MFr Bld: 5.9 % (ref 4.6–6.5)

## 2023-05-29 MED ORDER — SEMAGLUTIDE (1 MG/DOSE) 4 MG/3ML ~~LOC~~ SOPN: 1.0000 mg | PEN_INJECTOR | SUBCUTANEOUS | 5 refills | Status: DC

## 2023-05-29 NOTE — Patient Instructions (Signed)
Health Maintenance After Age 67 After age 67, you are at a higher risk for certain long-term diseases and infections as well as injuries from falls. Falls are a major cause of broken bones and head injuries in people who are older than age 67. Getting regular preventive care can help to keep you healthy and well. Preventive care includes getting regular testing and making lifestyle changes as recommended by your health care provider. Talk with your health care provider about: Which screenings and tests you should have. A screening is a test that checks for a disease when you have no symptoms. A diet and exercise plan that is right for you. What should I know about screenings and tests to prevent falls? Screening and testing are the best ways to find a health problem early. Early diagnosis and treatment give you the best chance of managing medical conditions that are common after age 67. Certain conditions and lifestyle choices may make you more likely to have a fall. Your health care provider may recommend: Regular vision checks. Poor vision and conditions such as cataracts can make you more likely to have a fall. If you wear glasses, make sure to get your prescription updated if your vision changes. Medicine review. Work with your health care provider to regularly review all of the medicines you are taking, including over-the-counter medicines. Ask your health care provider about any side effects that may make you more likely to have a fall. Tell your health care provider if any medicines that you take make you feel dizzy or sleepy. Strength and balance checks. Your health care provider may recommend certain tests to check your strength and balance while standing, walking, or changing positions. Foot health exam. Foot pain and numbness, as well as not wearing proper footwear, can make you more likely to have a fall. Screenings, including: Osteoporosis screening. Osteoporosis is a condition that causes  the bones to get weaker and break more easily. Blood pressure screening. Blood pressure changes and medicines to control blood pressure can make you feel dizzy. Depression screening. You may be more likely to have a fall if you have a fear of falling, feel depressed, or feel unable to do activities that you used to do. Alcohol use screening. Using too much alcohol can affect your balance and may make you more likely to have a fall. Follow these instructions at home: Lifestyle Do not drink alcohol if: Your health care provider tells you not to drink. If you drink alcohol: Limit how much you have to: 0-1 drink a day for women. 0-2 drinks a day for men. Know how much alcohol is in your drink. In the [place] one drink equals one 12 oz bottle of beer (355 mL), one 5 oz glass of wine (148 mL), or one 1 oz glass of hard liquor (44 mL). Do not use any products that contain nicotine or tobacco. These products include cigarettes, chewing tobacco, and vaping devices, such as e-cigarettes. If you need help quitting, ask your health care provider. Activity  Follow a regular exercise program to stay fit. This will help you maintain your balance. Ask your health care provider what types of exercise are appropriate for you. If you need a cane or walker, use it as recommended by your health care provider. Wear supportive shoes that have nonskid soles. Safety  Remove any tripping hazards, such as rugs, cords, and clutter. Install safety equipment such as grab bars in bathrooms and safety rails on stairs. Keep rooms and walkways   well-lit. General instructions Talk with your health care provider about your risks for falling. Tell your health care provider if: You fall. Be sure to tell your health care provider about all falls, even ones that seem minor. You feel dizzy, tiredness (fatigue), or off-balance. Take over-the-counter and prescription medicines only as told by your health care provider. These include  supplements. Eat a healthy diet and maintain a healthy weight. A healthy diet includes low-fat dairy products, low-fat (lean) meats, and fiber from whole grains, beans, and lots of fruits and vegetables. Stay current with your vaccines. Schedule regular health, dental, and eye exams. Summary Having a healthy lifestyle and getting preventive care can help to protect your health and wellness after age 67. Screening and testing are the best way to find a health problem early and help you avoid having a fall. Early diagnosis and treatment give you the best chance for managing medical conditions that are more common for people who are older than age 67. Falls are a major cause of broken bones and head injuries in people who are older than age 67. Take precautions to prevent a fall at home. Work with your health care provider to learn what changes you can make to improve your health and wellness and to prevent falls. This information is not intended to replace advice given to you by your health care provider. Make sure you discuss any questions you have with your health care provider. Document Revised: 10/16/2020 Document Reviewed: 10/16/2020 Elsevier Patient Education  2024 Elsevier Inc.  

## 2023-05-29 NOTE — Assessment & Plan Note (Signed)
Chronic and stable. GFR improved Will check urine for microalbuminuria May benefit from [place] Advised to stay well-hydrated and avoid NSAIDs

## 2023-05-29 NOTE — Progress Notes (Signed)
Emily Lopez 67 y.o.   Chief Complaint  Patient presents with   Follow-up    6 month f/u.     HISTORY OF PRESENT ILLNESS: This is a 67 y.o. female A1A here for 35-month follow-up of multiple chronic medical conditions including diabetes and hypertension. Overall doing well.  Has no complaints or medical concerns today. Lab Results  Component Value Date   HGBA1C 5.6 11/20/2022   BP Readings from Last 3 Encounters:  05/29/23 138/82  11/27/22 126/84  05/29/22 120/82   Wt Readings from Last 3 Encounters:  05/29/23 156 lb (70.8 kg)  11/27/22 156 lb (70.8 kg)  05/29/22 169 lb (76.7 kg)     HPI   Prior to Admission medications   Medication Sig Start Date End Date Taking? Authorizing Provider  amLODipine (NORVASC) 10 MG tablet TAKE 1 TABLET(10 MG) BY MOUTH DAILY 03/11/23  Yes Abdulkadir Emmanuel, Eilleen Kempf, MD  cholecalciferol (VITAMIN D3) 25 MCG (1000 UNIT) tablet Take 1 tablet (1,000 Units total) by mouth daily. 01/09/22  Yes Imogene Burn, MD  lisinopril (ZESTRIL) 20 MG tablet TAKE 1 TABLET(20 MG) BY MOUTH DAILY 09/11/22  Yes Shalaine Payson, Heeia, MD  Semaglutide, 1 MG/DOSE, 4 MG/3ML SOPN Inject 1 mg as directed once a week. 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 08/07/2006    Hypertension associated with diabetes (HCC) 08/07/2006    Past Medical History:  Diagnosis Date   Anxiety    Arthritis    Attention deficit disorder without mention of hyperactivity    Depression    Diabetes (HCC)    Edema of both lower extremities    Esophageal reflux    High blood sugar    High cholesterol    Hypertension    Iron deficiency anemia, unspecified    Myalgia and myositis, unspecified    Pneumonia    2008 or 2009   Pre-diabetes    Undiagnosed cardiac murmurs    Vitamin B12 deficiency    Vitamin D deficiency     Past Surgical History:  Procedure Laterality Date   COLONOSCOPY  11/23/2021   DILATATION & CURETTAGE/HYSTEROSCOPY WITH MYOSURE N/A 07/14/2018   Procedure: DILATATION & CURETTAGE/HYSTEROSCOPY with ultrasound guidance;  Surgeon: Patton Salles, MD;  Location: [facility];  Service: Gynecology;  Laterality: N/A;  ultrasound guidance needed. Follow previous case.   OPERATIVE ULTRASOUND N/A 07/14/2018   Procedure: OPERATIVE ULTRASOUND ultrasound guided hysteroscopy;  Surgeon: Patton Salles, MD;  Location: [facility];  Service: Gynecology;  Laterality: N/A;  ultrasound guided hysteroscopy/D&C due to cervical stenosis   UPPER GASTROINTESTINAL ENDOSCOPY  11/23/2021    Social History   Socioeconomic History   Marital status: Legally Separated    Spouse name: Not on file  Number of children: 3   Years of education: Not on file   Highest education level: Not on file  Occupational History   Occupation: Caregiver  Tobacco Use   Smoking status: Never   Smokeless tobacco: Former    Types: Snuff    Quit date: 07/24/2004   Tobacco comments:    Age 70 quit, started at age 20  Vaping Use   Vaping status: Never Used  Substance and Sexual Activity   Alcohol use: Not Currently    Comment: 2 drinks   Drug use: Never   Sexual activity: Not Currently    Birth control/protection: None, Post-menopausal  Other  Topics Concern   Not on file  Social History Narrative   Not on file   Social Drivers of Health   Financial Resource Strain: Low Risk  (05/13/2022)   Overall Financial Resource Strain (CARDIA)    Difficulty of Paying Living Expenses: Not hard at all  Food Insecurity: No Food Insecurity (05/13/2022)   Hunger Vital Sign    Worried About Running Out of Food in the Last Year: Never true    Ran Out of Food in the Last Year: Never true  Transportation Needs: No Transportation Needs (05/13/2022)   PRAPARE - Administrator, Civil Service (Medical): No    Lack of Transportation (Non-Medical): No  Physical Activity: Insufficiently Active (05/13/2022)   Exercise Vital Sign    Days of Exercise per Week: 2 days    Minutes of Exercise per Session: 30 min  Stress: No Stress Concern Present (05/13/2022)   [employer] of Occupational Health - Occupational Stress Questionnaire    Feeling of Stress : Not at all  Social Connections: Unknown (05/13/2022)   Social Connection and Isolation Panel [NHANES]    Frequency of Communication with Friends and Family: More than three times a week    Frequency of Social Gatherings with Friends and Family: More than three times a week    Attends Religious Services: More than 4 times per year    Active Member of [employer] or Organizations: Yes    Attends Banker Meetings: More than 4 times per year    Marital Status: Not on file  Intimate Partner Violence: Not At Risk (05/13/2022)   Humiliation, Afraid, Rape, and Kick questionnaire    Fear of Current or Ex-Partner: No    Emotionally Abused: No    Physically Abused: No    Sexually Abused: No    Family History  Problem Relation Age of Onset   Hypertension Mother    Diabetes Mother    Arthritis Mother    Hyperlipidemia Mother    Kidney disease Mother    Alcoholism Mother    Lung cancer Father    Heart attack Father    Heart disease Father    Alcoholism Father    Hypertension Sister     Breast cancer Sister    Hernia Sister    Diabetes Brother    Colon cancer Neg Hx    Esophageal cancer Neg Hx    Colon polyps Neg Hx    Rectal cancer Neg Hx    Stomach cancer Neg Hx      Review of Systems  Constitutional: Negative.  Negative for chills and fever.  HENT: Negative.  Negative for congestion and sore throat.   Respiratory: Negative.  Negative for cough and shortness of breath.   Cardiovascular: Negative.  Negative for chest pain and palpitations.  Gastrointestinal: Negative.  Negative for abdominal  pain, diarrhea, nausea and vomiting.  Genitourinary: Negative.  Negative for dysuria and hematuria.  Skin: Negative.  Negative for rash.  Neurological: Negative.  Negative for dizziness and headaches.  All other systems reviewed and are negative.   Today's Vitals   05/29/23 1328  BP: 138/82  Pulse: 79  Temp: 98.6 F (37 C)  TempSrc: Oral  SpO2: 98%  Weight: 156 lb (70.8 kg)  Height: 5\' 5"  (1.651 m)   Body mass index is 25.96 kg/m.   Physical Exam Vitals reviewed.  Constitutional:      Appearance: Normal appearance.  HENT:     Head: Normocephalic.     Mouth/Throat:     Mouth: Mucous membranes are moist.     Pharynx: Oropharynx is clear.  Eyes:     Extraocular Movements: Extraocular movements intact.     Conjunctiva/sclera: Conjunctivae normal.     Pupils: Pupils are equal, round, and reactive to light.  Cardiovascular:     Rate and Rhythm: Normal rate and regular rhythm.     Pulses: Normal pulses.     Heart sounds: Normal heart sounds.  Pulmonary:     Effort: Pulmonary effort is normal.     Breath sounds: Normal breath sounds.  Abdominal:     Palpations: Abdomen is soft.     Tenderness: There is no abdominal tenderness.  Musculoskeletal:     Cervical back: No tenderness.  Lymphadenopathy:     Cervical: No cervical adenopathy.  Skin:    General: Skin is warm and dry.     Capillary Refill: Capillary refill takes less than 2 seconds.   Neurological:     General: No focal deficit present.     Mental Status: She is alert and oriented to person, place, and time.  Psychiatric:        Mood and Affect: Mood normal.        Behavior: Behavior normal.      ASSESSMENT & PLAN: A total of 42 minutes was spent with the patient and counseling/coordination of care regarding preparing for this visit, review of most recent office visit notes, review of multiple chronic medical conditions and their management, review of all medications, review of most recent bloodwork results, review of health maintenance items, education on nutrition, prognosis, documentation, and need for follow up.   Problem List Items Addressed This Visit       Cardiovascular and Mediastinum   Hypertension associated with diabetes (HCC) - Primary   Well-controlled hypertension Continue amlodipine 10 mg and lisinopril 20 mg daily Well-controlled diabetes with hemoglobin A1c of 5.6 Continue semaglutide 1 mg weekly Cardiovascular risks associated with hypertension and diabetes discussed Diet and nutrition discussed Follow-up in 6 months      Relevant Medications   Semaglutide, 1 MG/DOSE, 4 MG/3ML SOPN   Other Relevant Orders   Urine Microalbumin w/creat. ratio   CBC with Differential/Platelet   Comprehensive metabolic panel   Hemoglobin A1c   Lipid panel     Genitourinary   Stage 3b chronic kidney disease (HCC)   Chronic and stable. GFR improved Will check urine for microalbuminuria May benefit from [place] Advised to stay well-hydrated and avoid NSAIDs      Patient Instructions  Health Maintenance After Age 41 After age 21, you are at a higher risk for certain long-term diseases and infections as well as injuries from falls. Falls are a major cause of broken bones and head injuries in people who are older than age 70. Getting regular preventive care can  help to keep you healthy and well. Preventive care includes getting regular testing and  making lifestyle changes as recommended by your health care provider. Talk with your health care provider about: Which screenings and tests you should have. A screening is a test that checks for a disease when you have no symptoms. A diet and exercise plan that is right for you. What should I know about screenings and tests to prevent falls? Screening and testing are the best ways to find a health problem early. Early diagnosis and treatment give you the best chance of managing medical conditions that are common after age 88. Certain conditions and lifestyle choices may make you more likely to have a fall. Your health care provider may recommend: Regular vision checks. Poor vision and conditions such as cataracts can make you more likely to have a fall. If you wear glasses, make sure to get your prescription updated if your vision changes. Medicine review. Work with your health care provider to regularly review all of the medicines you are taking, including over-the-counter medicines. Ask your health care provider about any side effects that may make you more likely to have a fall. Tell your health care provider if any medicines that you take make you feel dizzy or sleepy. Strength and balance checks. Your health care provider may recommend certain tests to check your strength and balance while standing, walking, or changing positions. Foot health exam. Foot pain and numbness, as well as not wearing proper footwear, can make you more likely to have a fall. Screenings, including: Osteoporosis screening. Osteoporosis is a condition that causes the bones to get weaker and break more easily. Blood pressure screening. Blood pressure changes and medicines to control blood pressure can make you feel dizzy. Depression screening. You may be more likely to have a fall if you have a fear of falling, feel depressed, or feel unable to do activities that you used to do. Alcohol use screening. Using too much alcohol  can affect your balance and may make you more likely to have a fall. Follow these instructions at home: Lifestyle Do not drink alcohol if: Your health care provider tells you not to drink. If you drink alcohol: Limit how much you have to: 0-1 drink a day for women. 0-2 drinks a day for men. Know how much alcohol is in your drink. In the [place] one drink equals one 12 oz bottle of beer (355 mL), one 5 oz glass of wine (148 mL), or one 1 oz glass of hard liquor (44 mL). Do not use any products that contain nicotine or tobacco. These products include cigarettes, chewing tobacco, and vaping devices, such as e-cigarettes. If you need help quitting, ask your health care provider. Activity  Follow a regular exercise program to stay fit. This will help you maintain your balance. Ask your health care provider what types of exercise are appropriate for you. If you need a cane or walker, use it as recommended by your health care provider. Wear supportive shoes that have nonskid soles. Safety  Remove any tripping hazards, such as rugs, cords, and clutter. Install safety equipment such as grab bars in bathrooms and safety rails on stairs. Keep rooms and walkways well-lit. General instructions Talk with your health care provider about your risks for falling. Tell your health care provider if: You fall. Be sure to tell your health care provider about all falls, even ones that seem minor. You feel dizzy, tiredness (fatigue), or off-balance. Take over-the-counter and prescription  medicines only as told by your health care provider. These include supplements. Eat a healthy diet and maintain a healthy weight. A healthy diet includes low-fat dairy products, low-fat (lean) meats, and fiber from whole grains, beans, and lots of fruits and vegetables. Stay current with your vaccines. Schedule regular health, dental, and eye exams. Summary Having a healthy lifestyle and getting preventive care can help to  protect your health and wellness after age 82. Screening and testing are the best way to find a health problem early and help you avoid having a fall. Early diagnosis and treatment give you the best chance for managing medical conditions that are more common for people who are older than age 45. Falls are a major cause of broken bones and head injuries in people who are older than age 34. Take precautions to prevent a fall at home. Work with your health care provider to learn what changes you can make to improve your health and wellness and to prevent falls. This information is not intended to replace advice given to you by your health care provider. Make sure you discuss any questions you have with your health care provider. Document Revised: 10/16/2020 Document Reviewed: 10/16/2020 Elsevier Patient Education  2024 Else[employer] Inc.    Edwina Barth, MD [facility]

## 2023-05-29 NOTE — Assessment & Plan Note (Signed)
Well-controlled hypertension Continue amlodipine 10 mg and lisinopril 20 mg daily Well-controlled diabetes with hemoglobin A1c of 5.6 Continue semaglutide 1 mg weekly Cardiovascular risks associated with hypertension and diabetes discussed Diet and nutrition discussed Follow-up in 6 months

## 2023-08-20 ENCOUNTER — Ambulatory Visit (INDEPENDENT_AMBULATORY_CARE_PROVIDER_SITE_OTHER)

## 2023-08-20 VITALS — Ht 65.0 in | Wt 156.0 lb

## 2023-08-20 DIAGNOSIS — Z780 Asymptomatic menopausal state: Secondary | ICD-10-CM

## 2023-08-20 DIAGNOSIS — Z0000 Encounter for general adult medical examination without abnormal findings: Secondary | ICD-10-CM | POA: Diagnosis not present

## 2023-08-20 NOTE — Progress Notes (Signed)
 Subjective:   Emily Lopez is a 68 y.o. who presents for a Medicare Wellness preventive visit.  Visit Complete: Virtual I connected with  DAENA ALPER on 08/20/23 by a audio enabled telemedicine application and verified that I am speaking with the correct person using two identifiers.  Patient Location: Home  Provider Location: Home Office  I discussed the limitations of evaluation and management by telemedicine. The patient expressed understanding and agreed to proceed.  Vital Signs: Because this visit was a virtual/telehealth visit, some criteria may be missing or patient reported. Any vitals not documented were not able to be obtained and vitals that have been documented are patient reported.  VideoDeclined- This patient declined Librarian, academic. Therefore the visit was completed with audio only.  AWV Questionnaire: No: Patient Medicare AWV questionnaire was not completed prior to this visit.  Cardiac Risk Factors include: advanced age (>19men, >50 women);hypertension;diabetes mellitus;Other (see comment), Risk factor comments: CKD Stage 3b     Objective:    Today's Vitals   08/20/23 1457  Weight: 156 lb (70.8 kg)  Height: 5\' 5"  (1.651 m)   Body mass index is 25.96 kg/m.     08/20/2023    3:23 PM 05/13/2022    3:08 PM 07/12/2020    8:16 AM 05/30/2020    7:55 AM 07/14/2018    8:16 AM 04/25/2017   10:06 PM 12/13/2016    1:27 PM  Advanced Directives  Does Patient Have a Medical Advance Directive? No No No No No No No  Would patient like information on creating a medical advance directive?  No - Patient declined No - Patient declined No - Patient declined Yes (MAU/Ambulatory/Procedural Areas - Information given) No - Patient declined Yes (ED - Information included in AVS)    Current Medications (verified) Outpatient Encounter Medications as of 08/20/2023  Medication Sig   amLODipine (NORVASC) 10 MG tablet TAKE 1 TABLET(10 MG) BY MOUTH  DAILY   cholecalciferol (VITAMIN D3) 25 MCG (1000 UNIT) tablet Take 1 tablet (1,000 Units total) by mouth daily.   lisinopril (ZESTRIL) 20 MG tablet TAKE 1 TABLET(20 MG) BY MOUTH DAILY   Semaglutide, 1 MG/DOSE, 4 MG/3ML SOPN Inject 1 mg as directed once a week. Follow-up appt is due must see provider for future refills   No facility-administered encounter medications on file as of 08/20/2023.    Allergies (verified) Patient has no known allergies.   History: Past Medical History:  Diagnosis Date   Anxiety    Arthritis    Attention deficit disorder without mention of hyperactivity    Depression    Diabetes (HCC)    Edema of both lower extremities    Esophageal reflux    High blood sugar    High cholesterol    Hypertension    Iron deficiency anemia, unspecified    Myalgia and myositis, unspecified    Pneumonia    2008 or 2009   Pre-diabetes    Undiagnosed cardiac murmurs    Vitamin B12 deficiency    Vitamin D deficiency    Past Surgical History:  Procedure Laterality Date   COLONOSCOPY  11/23/2021   DILATATION & CURETTAGE/HYSTEROSCOPY WITH MYOSURE N/A 07/14/2018   Procedure: DILATATION & CURETTAGE/HYSTEROSCOPY with ultrasound guidance;  Surgeon: Patton Salles, MD;  Location: [facility];  Service: Gynecology;  Laterality: N/A;  ultrasound guidance needed. Follow previous case.   OPERATIVE ULTRASOUND N/A 07/14/2018   Procedure: OPERATIVE ULTRASOUND ultrasound guided hysteroscopy;  Surgeon:  Patton Salles, MD;  Location: [facility];  Service: Gynecology;  Laterality: N/A;  ultrasound guided hysteroscopy/D&C due to cervical stenosis   UPPER GASTROINTESTINAL ENDOSCOPY  11/23/2021   Family History  Problem Relation Age of Onset   Hypertension Mother    Diabetes Mother    Arthritis Mother    Hyperlipidemia Mother    Kidney disease Mother    Alcoholism Mother    Lung cancer Father    Heart attack Father    Heart disease  Father    Alcoholism Father    Hypertension Sister    Breast cancer Sister    Hernia Sister    Diabetes Brother    Colon cancer Neg Hx    Esophageal cancer Neg Hx    Colon polyps Neg Hx    Rectal cancer Neg Hx    Stomach cancer Neg Hx    Social History   Socioeconomic History   Marital status: Legally Separated    Spouse name: Not on file   Number of children: 3   Years of education: Not on file   Highest education level: Not on file  Occupational History   Occupation: Caregiver  Tobacco Use   Smoking status: Never   Smokeless tobacco: Former    Types: Snuff    Quit date: 07/24/2004   Tobacco comments:    Age 8 quit, started at age 87  Vaping Use   Vaping status: Never Used  Substance and Sexual Activity   Alcohol use: Not Currently    Comment: 2 drinks   Drug use: Never   Sexual activity: Not Currently    Birth control/protection: None, Post-menopausal  Other Topics Concern   Not on file  Social History Narrative   Lives at home alone/with 2 dogs 2025   Social Drivers of Health   Financial Resource Strain: Low Risk  (08/20/2023)   Overall Financial Resource Strain (CARDIA)    Difficulty of Paying Living Expenses: Not hard at all  Food Insecurity: No Food Insecurity (08/20/2023)   Hunger Vital Sign    Worried About Running Out of Food in the Last Year: Never true    Ran Out of Food in the Last Year: Never true  Transportation Needs: No Transportation Needs (08/20/2023)   PRAPARE - Administrator, Civil Service (Medical): No    Lack of Transportation (Non-Medical): No  Physical Activity: Inactive (08/20/2023)   Exercise Vital Sign    Days of Exercise per Week: 0 days    Minutes of Exercise per Session: 0 min  Stress: No Stress Concern Present (08/20/2023)   [employer] of Occupational Health - Occupational Stress Questionnaire    Feeling of Stress : Not at all  Social Connections: Socially Isolated (08/20/2023)   Social Connection and Isolation  Panel [NHANES]    Frequency of Communication with Friends and Family: More than three times a week    Frequency of Social Gatherings with Friends and Family: Three times a week    Attends Religious Services: Never    Active Member of Clubs or Organizations: No    Attends Banker Meetings: Never    Marital Status: Separated    Tobacco Counseling Counseling given: Not Answered Tobacco comments: Age 21 quit, started at age 61    Clinical Intake:  Pre-visit preparation completed: Yes  Pain : No/denies pain     BMI - recorded: 25.96 Nutritional Status: BMI 25 -29 Overweight Nutritional Risks: None Diabetes: Yes CBG  done?: No Did pt. bring in CBG monitor from home?: No  How often do you need to have someone help you when you read instructions, pamphlets, or other written materials from your doctor or pharmacy?: 1 - Never  Interpreter Needed?: No  Information entered by :: Hope Holst, RMA   Activities of Daily Living     08/20/2023    3:09 PM  In your present state of health, do you have any difficulty performing the following activities:  Hearing? 0  Vision? 0  Difficulty concentrating or making decisions? 0  Walking or climbing stairs? 0  Dressing or bathing? 0  Doing errands, shopping? 0  Preparing Food and eating ? N  Using the Toilet? N  In the past six months, have you accidently leaked urine? N  Do you have problems with loss of bowel control? N  Managing your Medications? N  Managing your Finances? N  Housekeeping or managing your Housekeeping? N    Patient Care Team: Georgina Quint, MD as PCP - General (Internal Medicine) Venita Lick, MD as Consulting Physician (Orthopedic Surgery)  Indicate any recent Medical Services you may have received from other than [facility] providers in the past year (date may be approximate).     Assessment:   This is a routine wellness examination for Yarieliz.  Hearing/Vision screen Hearing Screening  - Comments:: Denies hearing difficulties   Vision Screening - Comments:: Denies vision issues.    Goals Addressed             This Visit's Progress    Patient Stated   On track    Keep my diabetes under control.       Depression Screen     08/20/2023    3:12 PM 05/29/2023    1:31 PM 11/27/2022    3:09 PM 05/29/2022    1:34 PM 05/13/2022    3:08 PM 08/15/2021    1:54 PM 01/09/2021    4:02 PM  PHQ 2/9 Scores  PHQ - 2 Score 0 0 0 0 0 0 0  PHQ- 9 Score 0          Fall Risk     08/20/2023    3:11 PM 05/29/2023    1:31 PM 11/27/2022    3:09 PM 05/29/2022    1:34 PM 05/13/2022    3:09 PM  Fall Risk   Falls in the past year? 0 0 0 0 0  Number falls in past yr: 0 0 0 0 0  Injury with Fall? 0 0 0 0 0  Risk for fall due to : No Fall Risks No Fall Risks No Fall Risks No Fall Risks No Fall Risks  Follow up Falls prevention discussed;Falls evaluation completed Falls evaluation completed Follow up appointment Falls evaluation completed Falls evaluation completed    MEDICARE RISK AT HOME:  Medicare Risk at Home Any stairs in or around the home?: No If so, are there any without handrails?: No Home free of loose throw rugs in walkways, pet beds, electrical cords, etc?: Yes Adequate lighting in your home to reduce risk of falls?: Yes Life alert?: No Use of a cane, walker or w/c?: No Grab bars in the bathroom?: Yes Shower chair or bench in shower?: Yes Elevated toilet seat or a handicapped toilet?: Yes  TIMED UP AND GO:  Was the test performed?  No  Cognitive Function: 6CIT completed        08/20/2023    3:11 PM 05/13/2022    3:09  PM  6CIT Screen  What Year? 0 points 0 points  What month? 0 points 0 points  What time? 0 points 0 points  Count back from 20 0 points 0 points  Months in reverse 0 points 0 points  Repeat phrase 0 points 0 points  Total Score 0 points 0 points    Immunizations Immunization History  Administered Date(s) Administered   Fluad Quad(high  Dose 65+) 03/11/2023   Hep B, Unspecified 01/21/2001   Hepatitis A, Adult 12/24/2013   Hepatitis B 01/25/2014   Hepatitis B, ADULT 12/24/2013, 01/25/2014   Hepatitis B, PED/ADOLESCENT 01/25/2014   Influenza Split 01/25/2014   Influenza,inj,Quad PF,6+ Mos 05/22/2017, 04/27/2018, 03/28/2019   MMR 09/30/2000   Moderna Covid-19 Vaccine Bivalent Booster 93yrs & up 03/19/2022   Moderna Sars-Covid-2 Vaccination 08/10/2019, 09/08/2019   PPD Test 07/01/2015   Pneumococcal Conjugate-13 05/22/2017   Pneumococcal Polysaccharide-23 04/27/2018   Tdap 12/24/2013   Zoster Recombinant(Shingrix) 04/07/2019, 07/05/2019    Screening Tests Health Maintenance  Topic Date Due   FOOT EXAM  07/04/2020   OPHTHALMOLOGY EXAM  12/15/2021   COVID-19 Vaccine (4 - 2024-25 season) 02/09/2023   Pneumonia Vaccine 79+ Years old (3 of 3 - PPSV23 or PCV20) 04/28/2023   HEMOGLOBIN A1C  11/27/2023   DTaP/Tdap/Td (2 - Td or Tdap) 12/25/2023   Diabetic kidney evaluation - eGFR measurement  05/28/2024   Diabetic kidney evaluation - Urine ACR  05/28/2024   Medicare Annual Wellness (AWV)  08/19/2024   MAMMOGRAM  05/06/2025   Colonoscopy  11/23/2028   INFLUENZA VACCINE  Completed   DEXA SCAN  Completed   Hepatitis C Screening  Completed   Zoster Vaccines- Shingrix  Completed   HPV VACCINES  Aged Out    Health Maintenance  Health Maintenance Due  Topic Date Due   FOOT EXAM  07/04/2020   OPHTHALMOLOGY EXAM  12/15/2021   COVID-19 Vaccine (4 - 2024-25 season) 02/09/2023   Pneumonia Vaccine 28+ Years old (3 of 3 - PPSV23 or PCV20) 04/28/2023   Health Maintenance Items Addressed: DEXA ordered, See Nurse Notes  Additional Screening:  Vision Screening: Recommended annual ophthalmology exams for early detection of glaucoma and other disorders of the eye.  Dental Screening: Recommended annual dental exams for proper oral hygiene  Community Resource Referral / Chronic Care Management: CRR required this visit?  No    CCM required this visit?  No     Plan:     I have personally reviewed and noted the following in the patient's chart:   Medical and social history Use of alcohol, tobacco or illicit drugs  Current medications and supplements including opioid prescriptions. Patient is not currently taking opioid prescriptions. Functional ability and status Nutritional status Physical activity Advanced directives List of other physicians Hospitalizations, surgeries, and ER visits in previous 12 months Vitals Screenings to include cognitive, depression, and falls Referrals and appointments  In addition, I have reviewed and discussed with patient certain preventive protocols, quality metrics, and best practice recommendations. A written personalized care plan for preventive services as well as general preventive health recommendations were provided to patient.     Giani Betzold L Katrina Brosh, CMA   08/20/2023   After Visit Summary: (MyChart) Due to this being a telephonic visit, the after visit summary with patients personalized plan was offered to patient via MyChart   Notes: Please refer to Routing Comments.

## 2023-08-20 NOTE — Patient Instructions (Signed)
 Emily Lopez , Thank you for taking time to come for your Medicare Wellness Visit. I appreciate your ongoing commitment to your health goals. Please review the following plan we discussed and let me know if I can assist you in the future.   Referrals/Orders/Follow-Ups/Clinician Recommendations: Each day, aim for 6 glasses of water, plenty of protein in your diet and try to get up and walk/ stretch every hour for 5-10 minutes at a time.  You have an order for:   [x]   Bone Density     Please call for appointment:  [facility] [address] [place], [place] [address] [phone]   Make sure to wear two-piece clothing.  No lotions, powders, or deodorants the day of the appointment. Make sure to bring picture ID and insurance card.  Bring list of medications you are currently taking including any supplements.    This is a list of the screening recommended for you and due dates:  Health Maintenance  Topic Date Due   Complete foot exam   07/04/2020   Eye exam for diabetics  12/15/2021   COVID-19 Vaccine (3 - 2024-25 season) 02/09/2023   Pneumonia Vaccine (3 of 3 - PPSV23 or PCV20) 04/28/2023   Hemoglobin A1C  11/27/2023   DTaP/Tdap/Td vaccine (2 - Td or Tdap) 12/25/2023   Yearly kidney function blood test for diabetes  05/28/2024   Yearly kidney health urinalysis for diabetes  05/28/2024   Medicare Annual Wellness Visit  08/19/2024   Mammogram  05/06/2025   Colon Cancer Screening  11/23/2028   Flu Shot  Completed   DEXA scan (bone density measurement)  Completed   Hepatitis C Screening  Completed   Zoster (Shingles) Vaccine  Completed   HPV Vaccine  Aged Out    Advanced directives: (Declined) Advance directive discussed with you today. Even though you declined this today, please call our office should you change your mind, and we can give you the proper paperwork for you to fill out.  Next Medicare Annual Wellness Visit scheduled for next year: Yes

## 2023-09-22 ENCOUNTER — Other Ambulatory Visit: Payer: Self-pay | Admitting: Emergency Medicine

## 2023-09-22 DIAGNOSIS — E1159 Type 2 diabetes mellitus with other circulatory complications: Secondary | ICD-10-CM

## 2023-09-30 ENCOUNTER — Telehealth: Payer: Self-pay | Admitting: Emergency Medicine

## 2023-09-30 NOTE — Telephone Encounter (Signed)
 Copied from CRM [id number]. Topic: Referral - Question >> Sep 30, 2023  9:36 AM Marlan Silva wrote: Reason for CRM: Patient wants to know if Dr. Vedia Geralds can refer somewhere else because the office he referred her too does not have any appointments. Patient is requesting a call back.  ---  I don't see any active referrals from us  this year, please assist

## 2023-09-30 NOTE — Telephone Encounter (Signed)
 Copied from CRM [id number]. Topic: Referral - Question >> Sep 30, 2023  9:36 AM Marlan Silva wrote: Reason for CRM: Patient wants to know if Dr. Vedia Geralds can refer somewhere else because the office he referred her too does not have any appointments. Patient is requesting a call back.

## 2023-10-03 ENCOUNTER — Other Ambulatory Visit: Payer: Self-pay | Admitting: Radiology

## 2023-10-03 DIAGNOSIS — Z8719 Personal history of other diseases of the digestive system: Secondary | ICD-10-CM

## 2023-10-03 DIAGNOSIS — Z1211 Encounter for screening for malignant neoplasm of colon: Secondary | ICD-10-CM

## 2023-10-03 DIAGNOSIS — D509 Iron deficiency anemia, unspecified: Secondary | ICD-10-CM

## 2023-10-07 ENCOUNTER — Other Ambulatory Visit: Payer: Self-pay | Admitting: Radiology

## 2023-10-07 ENCOUNTER — Encounter: Payer: Self-pay | Admitting: Radiology

## 2023-10-07 DIAGNOSIS — Z1211 Encounter for screening for malignant neoplasm of colon: Secondary | ICD-10-CM

## 2023-10-07 DIAGNOSIS — D509 Iron deficiency anemia, unspecified: Secondary | ICD-10-CM

## 2023-11-13 NOTE — Progress Notes (Deleted)
 11/13/2023 VEE BAHE [id number] 05-22-1956  Referring provider: Elvira Hammersmith, * Primary GI doctor: Dr. Rosaline Coma  ASSESSMENT AND PLAN:  IDA 05/29/2023  HGB 12.3 MCV 86.0 Platelets 304.0 01/09/2022 Iron 47 Ferritin 214.4 B12 665 Recent Labs    11/20/22 1447 05/29/23 1353  HGB 11.4* 12.3  11/2021 EGD normal stomach, duodenal lipoma, mild gastritis negative H. pylori 11/2021 colonoscopy normal TI, 2 mm polyp, diverticulosis, nonbleeding internal hemorrhoids - Patient had decreased hemoglobin June iron and ferritin were not checked.  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 female with a past medical history listed below presents for evaluation of ***.   Last seen in the office 01/2022 by Dr. Rosaline Coma for anemia  *** Discussed the use of AI scribe software for clinical note transcription with the patient, who gave verbal consent to proceed.  History of Present Illness            She  reports that she has never smoked. She quit smokeless tobacco use about 19 years ago.  Her smokeless tobacco use included snuff. She reports that she does not currently use alcohol. She reports that she does not use drugs.  RELEVANT GI HISTORY, IMAGING AND LABS: Results         Abd U/S 12/17/13: IMPRESSION:  Bilateral simple renal cysts are noted. Probable fatty infiltration  of the liver.    EGD 11/23/21: - White nummular lesions in esophageal mucosa. Biopsied. - Normal stomach. Biopsied. - Duodenal lipoma. Biopsied. - Biopsies were taken with a cold forceps for evaluation of celiac  disease. Path: 1. Surgical [P], duodenal nodule - DUODENAL MUCOSA WITH NO SIGNIFICANT PATHOLOGY. NOTE: THE ENDOSCOPIC IMPRESSION OF A NODULE IS NOTED. A MORPHOLOGIC CORRELATE TO A NODULE IS NOT IDENTIFIED; HOWEVER THE SPECIMEN CONSISTS OF SUPERFICIAL FRAGMENTS OF MUCOSA ONLY. SUBMUCOSA IS NOT PRESENT FOR EVALUATION IF ENDOSCOPICALLY THIS COULD BE A SUBMUCOSALLY LOCATED NODULE. 2. Surgical [P], duodenal - DUODENAL MUCOSA WITHIN NORMAL LIMITS. 3. Surgical [P], gastric - ANTRAL AND OXYNTIC MUCOSA WITH MILD CHRONIC INACTIVE GASTRITIS. - NO HELICOBACTER PYLORI ORGANISMS IDENTIFIED ON H&E STAINED SLIDE. 4. Surgical [P], esophagus - SQUAMOUS MUCOSA WITH NO SIGNIFICANT PATHOLOGY.   Colonoscopy 11/23/21: - The examined portion of the ileum was normal. - One 2 mm polyp in the cecum, removed with a cold biopsy forceps. Resected and retrieved. - Diverticulosis in the sigmoid colon and in the descending colon. - Non-bleeding internal hemorrhoids. Path: 5. Surgical [P], colon, ascending, polyp (1) - TUBULAR ADENOMA. CBC    Component Value Date/Time   WBC 5.4 05/29/2023 1353   RBC 4.33 05/29/2023 1353   HGB 12.3 05/29/2023 1353   HGB 12.4 04/18/2020 1026   HCT 37.2 05/29/2023 1353   HCT 37.2 04/18/2020 1026   PLT 304.0 05/29/2023 1353   PLT 327 04/18/2020 1026   MCV 86.0 05/29/2023 1353   MCV 81 04/18/2020 1026   MCH 27.1 04/18/2020 1026   MCH 27.1 07/14/2018 0829   MCHC 33.0 05/29/2023 1353   RDW 15.2 05/29/2023 1353   RDW 15.8 (H) 04/18/2020 1026   LYMPHSABS 2.0 05/29/2023 1353   LYMPHSABS 2.6 09/20/2016 1250  MONOABS 0.4 05/29/2023 1353   EOSABS 0.1 05/29/2023 1353   EOSABS 0.1 09/20/2016 1250   BASOSABS 0.0 05/29/2023 1353   BASOSABS 0.0 09/20/2016 1250   Recent Labs    11/20/22 1447 05/29/23 1353  HGB 11.4* 12.3    CMP     Component Value Date/Time   NA 140 05/29/2023 1353   NA 142 04/18/2020 1026   K 3.5 05/29/2023 1353   CL 102 05/29/2023 1353   CO2 30 05/29/2023  1353   GLUCOSE 109 (H) 05/29/2023 1353   BUN 17 05/29/2023 1353   BUN 16 04/18/2020 1026   CREATININE 1.39 (H) 05/29/2023 1353   CREATININE 0.82 10/16/2015 0845   CALCIUM 9.9 05/29/2023 1353   PROT 7.7 05/29/2023 1353   PROT 7.7 04/18/2020 1026   ALBUMIN 4.4 05/29/2023 1353   ALBUMIN 4.5 04/18/2020 1026   AST 18 05/29/2023 1353   ALT 11 05/29/2023 1353   ALKPHOS 60 05/29/2023 1353   BILITOT 0.9 05/29/2023 1353   BILITOT 0.5 04/18/2020 1026   GFRNONAA 51 (L) 04/18/2020 1026   GFRNONAA 27 (L) 09/04/2015 1045   GFRAA 59 (L) 04/18/2020 1026   GFRAA 31 (L) 09/04/2015 1045      Latest Ref Rng & Units 05/29/2023    1:53 PM 11/20/2022    2:47 PM 05/29/2022    2:38 PM  Hepatic Function  Total Protein 6.0 - 8.3 g/dL 7.7  7.7  7.8   Albumin 3.5 - 5.2 g/dL 4.4  4.1  4.3   AST 0 - 37 U/L 18  16  17    ALT 0 - 35 U/L 11  10  11    Alk Phosphatase 39 - 117 U/L 60  61  70   Total Bilirubin 0.2 - 1.2 mg/dL 0.9  0.6  0.6       Current Medications:   Current Outpatient Medications (Endocrine & Metabolic):    OZEMPIC , 1 MG/DOSE, 4 MG/3ML SOPN, INJECT 1 MG UNDER THE SKIN ONE DAY A WEEK AS DIRECTED  Current Outpatient Medications (Cardiovascular):    amLODipine  (NORVASC ) 10 MG tablet, TAKE 1 TABLET(10 MG) BY MOUTH DAILY   lisinopril  (ZESTRIL ) 20 MG tablet, TAKE 1 TABLET(20 MG) BY MOUTH DAILY     Current Outpatient Medications (Other):    cholecalciferol (VITAMIN D3) 25 MCG (1000 UNIT) tablet, Take 1 tablet (1,000 Units total) by mouth daily.  Medical History:  Past Medical History:  Diagnosis Date   Anxiety    Arthritis    Attention deficit disorder without mention of hyperactivity    Depression    Diabetes (HCC)    Edema of both lower extremities    Esophageal reflux    High blood sugar    High cholesterol    Hypertension    Iron deficiency anemia, unspecified    Myalgia and myositis, unspecified    Pneumonia    2008 or 2009   Pre-diabetes    Undiagnosed cardiac murmurs     Vitamin B12 deficiency    Vitamin D  deficiency    Allergies: No Known Allergies   Surgical History:  She  has a past surgical history that includes Dilatation & curettage/hysteroscopy with myosure (N/A, 07/14/2018); Operative ultrasound (N/A, 07/14/2018); Colonoscopy (11/23/2021); and Upper gastrointestinal endoscopy (11/23/2021). Family History:  Her family history includes Alcoholism in her father and mother; Arthritis in her mother; Breast cancer in her sister; Diabetes in her brother and mother; Heart attack in her father; Heart disease in her father; Hernia in her sister;  Hyperlipidemia in her mother; Hypertension in her mother and sister; Kidney disease in her mother; Lung cancer in her father.  REVIEW OF SYSTEMS  : All other systems reviewed and negative except where noted in the History of Present Illness.  PHYSICAL EXAM: LMP 06/11/2011 (Within Years)  Physical Exam          Edmonia Gottron, PA-C 12:44 PM

## 2023-11-14 ENCOUNTER — Ambulatory Visit: Admitting: Physician Assistant

## 2023-11-27 ENCOUNTER — Encounter: Payer: Self-pay | Admitting: Emergency Medicine

## 2023-11-27 ENCOUNTER — Ambulatory Visit: Payer: Self-pay | Admitting: Emergency Medicine

## 2023-11-27 ENCOUNTER — Ambulatory Visit: Payer: TRICARE For Life (TFL) | Admitting: Emergency Medicine

## 2023-11-27 VITALS — BP 140/80 | HR 64 | Temp 98.2°F | Ht 65.0 in | Wt 170.0 lb

## 2023-11-27 DIAGNOSIS — Z7984 Long term (current) use of oral hypoglycemic drugs: Secondary | ICD-10-CM

## 2023-11-27 DIAGNOSIS — N1832 Chronic kidney disease, stage 3b: Secondary | ICD-10-CM

## 2023-11-27 DIAGNOSIS — I152 Hypertension secondary to endocrine disorders: Secondary | ICD-10-CM

## 2023-11-27 DIAGNOSIS — E1159 Type 2 diabetes mellitus with other circulatory complications: Secondary | ICD-10-CM

## 2023-11-27 LAB — COMPREHENSIVE METABOLIC PANEL WITH GFR
ALT: 15 U/L (ref 0–35)
AST: 21 U/L (ref 0–37)
Albumin: 4.3 g/dL (ref 3.5–5.2)
Alkaline Phosphatase: 57 U/L (ref 39–117)
BUN: 18 mg/dL (ref 6–23)
CO2: 30 meq/L (ref 19–32)
Calcium: 9.4 mg/dL (ref 8.4–10.5)
Chloride: 106 meq/L (ref 96–112)
Creatinine, Ser: 1.03 mg/dL (ref 0.40–1.20)
GFR: 56.09 mL/min — ABNORMAL LOW (ref >60.00)
Glucose, Bld: 91 mg/dL (ref 70–99)
Potassium: 3.7 meq/L (ref 3.5–5.1)
Sodium: 141 meq/L (ref 135–145)
Total Bilirubin: 0.9 mg/dL (ref 0.2–1.2)
Total Protein: 8 g/dL (ref 6.0–8.3)

## 2023-11-27 LAB — CBC WITH DIFFERENTIAL/PLATELET
Basophils Absolute: 0.1 10*3/uL (ref 0.0–0.1)
Basophils Relative: 1 % (ref 0.0–3.0)
Eosinophils Absolute: 0.1 10*3/uL (ref 0.0–0.7)
Eosinophils Relative: 1.8 % (ref 0.0–5.0)
HCT: 35.8 % — ABNORMAL LOW (ref 36.0–46.0)
Hemoglobin: 11.8 g/dL — ABNORMAL LOW (ref 12.0–15.0)
Lymphocytes Relative: 45.6 % (ref 12.0–46.0)
Lymphs Abs: 2.3 10*3/uL (ref 0.7–4.0)
MCHC: 32.8 g/dL (ref 30.0–36.0)
MCV: 84 fl (ref 78.0–100.0)
Monocytes Absolute: 0.3 10*3/uL (ref 0.1–1.0)
Monocytes Relative: 5.6 % (ref 3.0–12.0)
Neutro Abs: 2.4 10*3/uL (ref 1.4–7.7)
Neutrophils Relative %: 46 % (ref 43.0–77.0)
Platelets: 276 10*3/uL (ref 150.0–400.0)
RBC: 4.27 Mil/uL (ref 3.87–5.11)
RDW: 15.7 % — ABNORMAL HIGH (ref 11.5–15.5)
WBC: 5.1 10*3/uL (ref 4.0–10.5)

## 2023-11-27 LAB — LIPID PANEL
Cholesterol: 219 mg/dL — ABNORMAL HIGH (ref 0–200)
HDL: 102.5 mg/dL (ref >39.00)
LDL Cholesterol: 108 mg/dL — ABNORMAL HIGH (ref 0–99)
NonHDL: 116.59
Total CHOL/HDL Ratio: 2
Triglycerides: 43 mg/dL (ref 0.0–149.0)
VLDL: 8.6 mg/dL (ref 0.0–40.0)

## 2023-11-27 LAB — HEMOGLOBIN A1C: Hgb A1c MFr Bld: 6.1 % (ref 4.6–6.5)

## 2023-11-27 MED ORDER — FARXIGA 10 MG PO TABS: 10.0000 mg | ORAL_TABLET | Freq: Every day | ORAL | 3 refills | Status: AC

## 2023-11-27 MED ORDER — LISINOPRIL 20 MG PO TABS
ORAL_TABLET | ORAL | 3 refills | Status: AC
Start: 2023-11-27 — End: ?

## 2023-11-27 NOTE — Progress Notes (Signed)
 Emily Lopez 68 y.o.   Chief Complaint  Patient presents with   Follow-up    Patient here for 6 month f/u for HTN / DM.     HISTORY OF PRESENT ILLNESS: This is a 68 y.o. female here for 82-month follow-up of hypertension and diabetes Overall doing well.  Has no complaints or medical concerns today. BP Readings from Last 3 Encounters:  05/29/23 138/82  11/27/22 126/84  05/29/22 120/82   Lab Results  Component Value Date   HGBA1C 5.9 05/29/2023   Wt Readings from Last 3 Encounters:  08/20/23 156 lb (70.8 kg)  05/29/23 156 lb (70.8 kg)  11/27/22 156 lb (70.8 kg)     HPI   Prior to Admission medications   Medication Sig Start Date End Date Taking? Authorizing Provider  amLODipine  (NORVASC ) 10 MG tablet TAKE 1 TABLET(10 MG) BY MOUTH DAILY 03/11/23  Yes Whitt Auletta, Isidro Margo, MD  cholecalciferol (VITAMIN D3) 25 MCG (1000 UNIT) tablet Take 1 tablet (1,000 Units total) by mouth daily. 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 08/07/2006   Hypertension associated with diabetes (HCC) 08/07/2006    Past  Medical History:  Diagnosis Date   Anxiety    Arthritis    Attention deficit disorder without mention of hyperactivity    Depression    Diabetes (HCC)    Edema of both lower extremities    Esophageal reflux    High blood sugar    High cholesterol    Hypertension    Iron deficiency anemia, unspecified    Myalgia and myositis, unspecified    Pneumonia    2008 or 2009   Pre-diabetes    Undiagnosed cardiac murmurs    Vitamin B12 deficiency    Vitamin D  deficiency     Past Surgical History:  Procedure Laterality Date   COLONOSCOPY  11/23/2021   DILATATION & CURETTAGE/HYSTEROSCOPY WITH MYOSURE N/A 07/14/2018   Procedure: DILATATION & CURETTAGE/HYSTEROSCOPY with ultrasound guidance;  Surgeon: Greta Leatherwood, MD;  Location: [facility] [place];  Service: Gynecology;  Laterality: N/A;  ultrasound guidance needed. Follow previous case.   OPERATIVE ULTRASOUND N/A 07/14/2018   Procedure: OPERATIVE ULTRASOUND ultrasound guided hysteroscopy;  Surgeon: Greta Leatherwood, MD;  Location: [facility];  Service: Gynecology;  Laterality: N/A;  ultrasound guided hysteroscopy/D&C due to cervical stenosis   UPPER GASTROINTESTINAL ENDOSCOPY  11/23/2021    Social History   Socioeconomic History   Marital status: Legally Separated    Spouse name: Not on file   Number of children: 3  Years of education: Not on file   Highest education level: Not on file  Occupational History   Occupation: Caregiver  Tobacco Use   Smoking status: Never   Smokeless tobacco: Former    Types: Snuff    Quit date: 07/24/2004   Tobacco comments:    Age 52 quit, started at age 21  Vaping Use   Vaping status: Never Used  Substance and Sexual Activity   Alcohol use: Not Currently    Comment: 2 drinks   Drug use: Never   Sexual activity: Not Currently    Birth control/protection: None, Post-menopausal  Other Topics Concern   Not on file  Social History Narrative   Lives  at home alone/with 2 dogs 2025   Social Drivers of Health   Financial Resource Strain: Low Risk  (08/20/2023)   Overall Financial Resource Strain (CARDIA)    Difficulty of Paying Living Expenses: Not hard at all  Food Insecurity: No Food Insecurity (08/20/2023)   Hunger Vital Sign    Worried About Running Out of Food in the Last Year: Never true    Ran Out of Food in the Last Year: Never true  Transportation Needs: No Transportation Needs (08/20/2023)   PRAPARE - Administrator, Civil Service (Medical): No    Lack of Transportation (Non-Medical): No  Physical Activity: Inactive (08/20/2023)   Exercise Vital Sign    Days of Exercise per Week: 0 days    Minutes of Exercise per Session: 0 min  Stress: No Stress Concern Present (08/20/2023)   [employer] of Occupational Health - Occupational Stress Questionnaire    Feeling of Stress : Not at all  Social Connections: Socially Isolated (08/20/2023)   Social Connection and Isolation Panel    Frequency of Communication with Friends and Family: More than three times a week    Frequency of Social Gatherings with Friends and Family: Three times a week    Attends Religious Services: Never    Active Member of Clubs or Organizations: No    Attends Banker Meetings: Never    Marital Status: Separated  Intimate Partner Violence: Not At Risk (05/13/2022)   Humiliation, Afraid, Rape, and Kick questionnaire    Fear of Current or Ex-Partner: No    Emotionally Abused: No    Physically Abused: No    Sexually Abused: No    Family History  Problem Relation Age of Onset   Hypertension Mother    Diabetes Mother    Arthritis Mother    Hyperlipidemia Mother    Kidney disease Mother    Alcoholism Mother    Lung cancer Father    Heart attack Father    Heart disease Father    Alcoholism Father    Hypertension Sister    Breast cancer Sister    Hernia Sister    Diabetes Brother    Colon cancer Neg Hx    Esophageal cancer  Neg Hx    Colon polyps Neg Hx    Rectal cancer Neg Hx    Stomach cancer Neg Hx      Review of Systems  Constitutional: Negative.  Negative for chills and fever.  HENT: Negative.  Negative for congestion and sore throat.   Respiratory: Negative.  Negative for cough and shortness of breath.   Cardiovascular: Negative.  Negative for chest pain and palpitations.  Gastrointestinal:  Negative for abdominal pain, diarrhea, nausea and vomiting.  Genitourinary: Negative.  Negative for dysuria and hematuria.  Skin: Negative.  Negative for rash.  Neurological: Negative.  Negative for dizziness and headaches.  All other systems reviewed and are negative.   Today's Vitals   11/27/23 1257  BP: (!) 140/80  Pulse: 64  Temp: 98.2 F (36.8 C)  TempSrc: Oral  SpO2: 97%  Weight: 170 lb (77.1 kg)  Height: 5' 5 (1.651 m)   Body mass index is 28.29 kg/m.   Physical Exam Vitals reviewed.  Constitutional:      Appearance: Normal appearance.  HENT:     Head: Normocephalic.   Eyes:     Extraocular Movements: Extraocular movements intact.     Pupils: Pupils are equal, round, and reactive to light.    Cardiovascular:     Rate and Rhythm: Normal rate and regular rhythm.     Pulses: Normal pulses.     Heart sounds: Murmur heard.  Pulmonary:     Effort: Pulmonary effort is normal.     Breath sounds: Normal breath sounds.   Musculoskeletal:     Cervical back: No tenderness.  Lymphadenopathy:     Cervical: No cervical adenopathy.   Skin:    General: Skin is warm and dry.     Capillary Refill: Capillary refill takes less than 2 seconds.   Neurological:     General: No focal deficit present.     Mental Status: She is alert and oriented to person, place, and time.   Psychiatric:        Mood and Affect: Mood normal.        Behavior: Behavior normal.      ASSESSMENT & PLAN: A total of 42 minutes was spent with the patient and counseling/coordination of care regarding preparing  for this visit, review of most recent office visit notes, review of multiple chronic medical conditions and their management, review of all medications, review of most recent bloodwork results, review of health maintenance items, education on nutrition, prognosis, documentation, and need for follow up.   Problem List Items Addressed This Visit       Cardiovascular and Mediastinum   Hypertension associated with diabetes (HCC) - Primary   Well-controlled hypertension Continue amlodipine  10 mg and lisinopril  20 mg daily Well-controlled diabetes with hemoglobin A1c of 5.6 Continue semaglutide  1 mg weekly and start daily Farxiga 10 mg mostly for kidney protection. Cardiovascular risks associated with hypertension and diabetes discussed Diet and nutrition discussed Follow-up in 6 months      Relevant Medications   lisinopril  (ZESTRIL ) 20 MG tablet   FARXIGA 10 MG TABS tablet   Other Relevant Orders   CBC with Differential/Platelet   Hemoglobin A1c   Lipid panel   Comprehensive metabolic panel with GFR     Genitourinary   Stage 3b chronic kidney disease (HCC)   Chronic stable condition Advised to stay well-hydrated and avoid NSAIDs is much as possible Recommend to start Farxiga 10 mg daily      Relevant Medications   FARXIGA 10 MG TABS tablet   Other Relevant Orders   CBC with Differential/Platelet   Hemoglobin A1c   Lipid panel   Comprehensive metabolic panel with GFR   Patient Instructions  Health Maintenance After Age 56 After age 40, you are at a higher risk for certain long-term diseases and infections as well as injuries from falls. Falls are a major cause of broken bones and head injuries in people who are older than age 69. Getting regular preventive care can help to keep you healthy and well. Preventive care includes getting regular  testing and making lifestyle changes as recommended by your health care provider. Talk with your health care provider about: Which  screenings and tests you should have. A screening is a test that checks for a disease when you have no symptoms. A diet and exercise plan that is right for you. What should I know about screenings and tests to prevent falls? Screening and testing are the best ways to find a health problem early. Early diagnosis and treatment give you the best chance of managing medical conditions that are common after age 34. Certain conditions and lifestyle choices may make you more likely to have a fall. Your health care provider may recommend: Regular vision checks. Poor vision and conditions such as cataracts can make you more likely to have a fall. If you wear glasses, make sure to get your prescription updated if your vision changes. Medicine review. Work with your health care provider to regularly review all of the medicines you are taking, including over-the-counter medicines. Ask your health care provider about any side effects that may make you more likely to have a fall. Tell your health care provider if any medicines that you take make you feel dizzy or sleepy. Strength and balance checks. Your health care provider may recommend certain tests to check your strength and balance while standing, walking, or changing positions. Foot health exam. Foot pain and numbness, as well as not wearing proper footwear, can make you more likely to have a fall. Screenings, including: Osteoporosis screening. Osteoporosis is a condition that causes the bones to get weaker and break more easily. Blood pressure screening. Blood pressure changes and medicines to control blood pressure can make you feel dizzy. Depression screening. You may be more likely to have a fall if you have a fear of falling, feel depressed, or feel unable to do activities that you used to do. Alcohol use screening. Using too much alcohol can affect your balance and may make you more likely to have a fall. Follow these instructions at home: Lifestyle Do  not drink alcohol if: Your health care provider tells you not to drink. If you drink alcohol: Limit how much you have to: 0-1 drink a day for women. 0-2 drinks a day for men. Know how much alcohol is in your drink. In the [place] one drink equals one 12 oz bottle of beer (355 mL), one 5 oz glass of wine (148 mL), or one 1 oz glass of hard liquor (44 mL). Do not use any products that contain nicotine or tobacco. These products include cigarettes, chewing tobacco, and vaping devices, such as e-cigarettes. If you need help quitting, ask your health care provider. Activity  Follow a regular exercise program to stay fit. This will help you maintain your balance. Ask your health care provider what types of exercise are appropriate for you. If you need a cane or walker, use it as recommended by your health care provider. Wear supportive shoes that have nonskid soles. Safety  Remove any tripping hazards, such as rugs, cords, and clutter. Install safety equipment such as grab bars in bathrooms and safety rails on stairs. Keep rooms and walkways well-lit. General instructions Talk with your health care provider about your risks for falling. Tell your health care provider if: You fall. Be sure to tell your health care provider about all falls, even ones that seem minor. You feel dizzy, tiredness (fatigue), or off-balance. Take over-the-counter and prescription medicines only as told by your health care provider. These include supplements.  Eat a healthy diet and maintain a healthy weight. A healthy diet includes low-fat dairy products, low-fat (lean) meats, and fiber from whole grains, beans, and lots of fruits and vegetables. Stay current with your vaccines. Schedule regular health, dental, and eye exams. Summary Having a healthy lifestyle and getting preventive care can help to protect your health and wellness after age 33. Screening and testing are the best way to find a health problem early and  help you avoid having a fall. Early diagnosis and treatment give you the best chance for managing medical conditions that are more common for people who are older than age 47. Falls are a major cause of broken bones and head injuries in people who are older than age 57. Take precautions to prevent a fall at home. Work with your health care provider to learn what changes you can make to improve your health and wellness and to prevent falls. This information is not intended to replace advice given to you by your health care provider. Make sure you discuss any questions you have with your health care provider. Document Revised: 10/16/2020 Document Reviewed: 10/16/2020 Elsevier Patient Education  2024 Elsevier Inc.     Maryagnes Small, MD [facility]

## 2023-11-27 NOTE — Assessment & Plan Note (Addendum)
 Well-controlled hypertension Continue amlodipine  10 mg and lisinopril  20 mg daily Well-controlled diabetes with hemoglobin A1c of 5.6 Continue semaglutide  1 mg weekly and start daily Farxiga 10 mg mostly for kidney protection. Cardiovascular risks associated with hypertension and diabetes discussed Diet and nutrition discussed Follow-up in 6 months

## 2023-11-27 NOTE — Assessment & Plan Note (Signed)
 Chronic stable condition Advised to stay well-hydrated and avoid NSAIDs is much as possible Recommend to start Farxiga 10 mg daily

## 2023-11-27 NOTE — Patient Instructions (Signed)
 Health Maintenance After Age 68 After age 4, you are at a higher risk for certain long-term diseases and infections as well as injuries from falls. Falls are a major cause of broken bones and head injuries in people who are older than age 47. Getting regular preventive care can help to keep you healthy and well. Preventive care includes getting regular testing and making lifestyle changes as recommended by your health care provider. Talk with your health care provider about: Which screenings and tests you should have. A screening is a test that checks for a disease when you have no symptoms. A diet and exercise plan that is right for you. What should I know about screenings and tests to prevent falls? Screening and testing are the best ways to find a health problem early. Early diagnosis and treatment give you the best chance of managing medical conditions that are common after age 37. Certain conditions and lifestyle choices may make you more likely to have a fall. Your health care provider may recommend: Regular vision checks. Poor vision and conditions such as cataracts can make you more likely to have a fall. If you wear glasses, make sure to get your prescription updated if your vision changes. Medicine review. Work with your health care provider to regularly review all of the medicines you are taking, including over-the-counter medicines. Ask your health care provider about any side effects that may make you more likely to have a fall. Tell your health care provider if any medicines that you take make you feel dizzy or sleepy. Strength and balance checks. Your health care provider may recommend certain tests to check your strength and balance while standing, walking, or changing positions. Foot health exam. Foot pain and numbness, as well as not wearing proper footwear, can make you more likely to have a fall. Screenings, including: Osteoporosis screening. Osteoporosis is a condition that causes  the bones to get weaker and break more easily. Blood pressure screening. Blood pressure changes and medicines to control blood pressure can make you feel dizzy. Depression screening. You may be more likely to have a fall if you have a fear of falling, feel depressed, or feel unable to do activities that you used to do. Alcohol use screening. Using too much alcohol can affect your balance and may make you more likely to have a fall. Follow these instructions at home: Lifestyle Do not drink alcohol if: Your health care provider tells you not to drink. If you drink alcohol: Limit how much you have to: 0-1 drink a day for women. 0-2 drinks a day for men. Know how much alcohol is in your drink. In the [place] one drink equals one 12 oz bottle of beer (355 mL), one 5 oz glass of wine (148 mL), or one 1 oz glass of hard liquor (44 mL). Do not use any products that contain nicotine or tobacco. These products include cigarettes, chewing tobacco, and vaping devices, such as e-cigarettes. If you need help quitting, ask your health care provider. Activity  Follow a regular exercise program to stay fit. This will help you maintain your balance. Ask your health care provider what types of exercise are appropriate for you. If you need a cane or walker, use it as recommended by your health care provider. Wear supportive shoes that have nonskid soles. Safety  Remove any tripping hazards, such as rugs, cords, and clutter. Install safety equipment such as grab bars in bathrooms and safety rails on stairs. Keep rooms and walkways  well-lit. General instructions Talk with your health care provider about your risks for falling. Tell your health care provider if: You fall. Be sure to tell your health care provider about all falls, even ones that seem minor. You feel dizzy, tiredness (fatigue), or off-balance. Take over-the-counter and prescription medicines only as told by your health care provider. These include  supplements. Eat a healthy diet and maintain a healthy weight. A healthy diet includes low-fat dairy products, low-fat (lean) meats, and fiber from whole grains, beans, and lots of fruits and vegetables. Stay current with your vaccines. Schedule regular health, dental, and eye exams. Summary Having a healthy lifestyle and getting preventive care can help to protect your health and wellness after age 11. Screening and testing are the best way to find a health problem early and help you avoid having a fall. Early diagnosis and treatment give you the best chance for managing medical conditions that are more common for people who are older than age 28. Falls are a major cause of broken bones and head injuries in people who are older than age 48. Take precautions to prevent a fall at home. Work with your health care provider to learn what changes you can make to improve your health and wellness and to prevent falls. This information is not intended to replace advice given to you by your health care provider. Make sure you discuss any questions you have with your health care provider. Document Revised: 10/16/2020 Document Reviewed: 10/16/2020 Elsevier Patient Education  2024 [employer].

## 2023-12-02 ENCOUNTER — Other Ambulatory Visit (HOSPITAL_COMMUNITY): Payer: Self-pay

## 2023-12-02 ENCOUNTER — Telehealth: Payer: Self-pay

## 2023-12-02 NOTE — Telephone Encounter (Signed)
 Pharmacy Patient Advocate Encounter   Received notification from [employer]base that prior authorization for Farxiga  10MG  tablets is required/requested.   Insurance verification completed.   The patient is insured through [employer] .   Per test claim: PA required; PA submitted to above mentioned insurance via CoverMyMeds Key/confirmation #/EOC [facility] Status is pending

## 2023-12-03 NOTE — Telephone Encounter (Signed)
 Pharmacy Patient Advocate Encounter  Received notification from [employer] that Prior Authorization for Farxiga  tabs has been DENIED.  Full denial letter will be uploaded to the media tab. See denial reason below.   PA #/Case ID/Reference #: [id number]

## 2023-12-10 NOTE — Progress Notes (Unsigned)
 12/11/2023 Emily Lopez [id number] 07-30-1955  Referring provider: Purcell Emil Lopez, * Primary GI doctor: Dr. Federico  ASSESSMENT AND PLAN:  IDA 11/27/2023  HGB 11.8 MCV 84.0 Platelets 276.0 01/09/2022 Iron 47 Ferritin 214.4 B12 665 Recent Labs    05/29/23 1353 11/27/23 1327  HGB 12.3 11.8*  11/2021 EGD normal stomach, duodenal lipoma, mild gastritis negative H. pylori 11/2021 colonoscopy normal TI, 2 mm polyp, diverticulosis, nonbleeding internal hemorrhoids Patient had decreased hemoglobin June iron and ferritin were not checked. Normally takes iron OTC, has been out for a month No overt GI bleeding Hemoglobin decreased to 11.8. Possible causes include lack of iron supplementation. Differential includes B12 or folate deficiency. Discussed potential iron infusion due to Ozempic -related constipation. - Check iron and ferritin levels. - Provide Hemoccult cards for home testing. - Consider capsule endoscopy if blood is detected in stool. - Prescribe iron supplements if iron levels are low. - Consider iron infusion if iron levels are significantly low.  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 On ozempic   Fatty liver seen on abdominal ultrasound 2015    Latest Ref Rng & Units 11/27/2023    1:27 PM 05/29/2023    1:53 PM 11/20/2022    2:47 PM  Hepatic Function  Total Protein 6.0 - 8.3 g/dL 8.0  7.7  7.7   Albumin 3.5 - 5.2 g/dL 4.3  4.4  4.1   AST 0 - 37 U/L 21  18  16    ALT 0 - 35 U/L 15  11  10    Alk Phosphatase 39 - 117 U/L 57  60  61   Total Bilirubin 0.2 - 1.2 mg/dL 0.9  0.9  0.6    Platelets 304.0  FIB 4 = 1.2, fibrosis excluded - monitor CBC/LFTs every 6 months - consider elastography - continue  ozempic   Patient Care Team: Emily Emil Schanz, MD as PCP - General (Internal Medicine) Emily Aures, MD as Consulting Physician (Orthopedic Surgery)  HISTORY OF PRESENT ILLNESS: 68 y.o. female with a past medical history listed below presents for evaluation of anemia.   Last seen in the office 01/2022 by Dr. Federico for anemia  Discussed the use of AI scribe software for clinical note transcription with the patient, who gave verbal consent to proceed.  History of Present Illness   Emily Lopez is a 68 year old female with iron deficiency anemia who presents for evaluation of recurrent anemia.  She has a history of iron deficiency anemia, initially identified in 2023, which led to an EGD and colonoscopy. The colonoscopy in June 2023 revealed a small polyp, diverticulosis, and hemorrhoids. Her hemoglobin levels have recently decreased from 12.3 to 11.8.  She has been out of her over-the-counter iron supplements for about a month but takes prenatal vitamins, though she is unsure of their iron content. She prefers 'the dark green ones' as they 'settle with you the best'.  She experiences constipation, which she attributes to Ozempic  use, and manages it by drinking more water and exercising. She eats greens at least once a week. No blood in stool, changes in bowel habits, abdominal pain, heartburn, reflux, nausea, vomiting, chest pain, or shortness of breath.  She consumes two shots of alcohol daily, which exceeds the recommended limit.  Her father had a history of alcoholism.      She  reports that she has never smoked. She quit smokeless tobacco use about 19 years ago.  Her smokeless tobacco use included snuff. She reports that she does not currently use alcohol. She reports that she does not use drugs.  RELEVANT GI HISTORY, IMAGING AND LABS: Results   LABS Hb: 11.8 (11/26/2021) Hb: 12.3 (05/2021) Liver function tests: Normal (11/26/2021) Fibrosis assessment: No fibrosis  (11/26/2021)  RADIOLOGY Abdominal ultrasound: Fatty liver (2015)  DIAGNOSTIC Colonoscopy: Small polyp, diverticulosis, hemorrhoids (11/2021) EGD: Normal (11/2021)     Abd U/S 12/17/13: IMPRESSION:  Bilateral simple renal cysts are noted. Probable fatty infiltration  of the liver.    EGD 11/23/21: - White nummular lesions in esophageal mucosa. Biopsied. - Normal stomach. Biopsied. - Duodenal lipoma. Biopsied. - Biopsies were taken with a cold forceps for evaluation of celiac disease. Path: 1. Surgical [P], duodenal nodule - DUODENAL MUCOSA WITH NO SIGNIFICANT PATHOLOGY. NOTE: THE ENDOSCOPIC IMPRESSION OF A NODULE IS NOTED. A MORPHOLOGIC CORRELATE TO A NODULE IS NOT IDENTIFIED; HOWEVER THE SPECIMEN CONSISTS OF SUPERFICIAL FRAGMENTS OF MUCOSA ONLY. SUBMUCOSA IS NOT PRESENT FOR EVALUATION IF ENDOSCOPICALLY THIS COULD BE A SUBMUCOSALLY LOCATED NODULE. 2. Surgical [P], duodenal - DUODENAL MUCOSA WITHIN NORMAL LIMITS. 3. Surgical [P], gastric - ANTRAL AND OXYNTIC MUCOSA WITH MILD CHRONIC INACTIVE GASTRITIS. - NO HELICOBACTER PYLORI ORGANISMS IDENTIFIED ON H&E STAINED SLIDE. 4. Surgical [P], esophagus - SQUAMOUS MUCOSA WITH NO SIGNIFICANT PATHOLOGY.   Colonoscopy 11/23/21: - The examined portion of the ileum was normal. - One 2 mm polyp in the cecum, removed with a cold biopsy forceps. Resected and retrieved. - Diverticulosis in the sigmoid colon and in the descending colon. - Non-bleeding internal hemorrhoids. Path: 5. Surgical [P], colon, ascending, polyp (1) - TUBULAR ADENOMA. CBC    Component Value Date/Time   WBC 5.1 11/27/2023 1327   RBC 4.27 11/27/2023 1327   HGB 11.8 (L) 11/27/2023 1327   HGB 12.4 04/18/2020 1026   HCT 35.8 (L) 11/27/2023 1327   HCT 37.2 04/18/2020 1026   PLT 276.0 11/27/2023 1327   PLT 327 04/18/2020 1026   MCV 84.0 11/27/2023 1327   MCV 81 04/18/2020 1026   MCH 27.1 04/18/2020 1026   MCH 27.1 07/14/2018 0829   MCHC 32.8 11/27/2023 1327   RDW  15.7 (H) 11/27/2023 1327   RDW 15.8 (H) 04/18/2020 1026   LYMPHSABS 2.3 11/27/2023 1327   LYMPHSABS 2.6 09/20/2016 1250   MONOABS 0.3 11/27/2023 1327   EOSABS 0.1 11/27/2023 1327   EOSABS 0.1 09/20/2016 1250   BASOSABS 0.1 11/27/2023 1327   BASOSABS 0.0 09/20/2016 1250   Recent Labs    05/29/23 1353 11/27/23 1327  HGB 12.3 11.8*    CMP     Component Value Date/Time   NA 141 11/27/2023 1327   NA 142 04/18/2020 1026   K 3.7 11/27/2023 1327   CL 106 11/27/2023 1327   CO2 30 11/27/2023 1327   GLUCOSE 91 11/27/2023 1327   BUN 18 11/27/2023 1327   BUN 16 04/18/2020 1026   CREATININE 1.03 11/27/2023 1327   CREATININE 0.82 10/16/2015 0845   CALCIUM 9.4 11/27/2023 1327   PROT 8.0 11/27/2023 1327   PROT 7.7 04/18/2020 1026   ALBUMIN 4.3 11/27/2023 1327   ALBUMIN 4.5 04/18/2020 1026   AST 21 11/27/2023 1327   ALT 15 11/27/2023 1327   ALKPHOS 57 11/27/2023 1327   BILITOT 0.9 11/27/2023 1327   BILITOT 0.5  04/18/2020 1026   GFRNONAA 51 (L) 04/18/2020 1026   GFRNONAA 27 (L) 09/04/2015 1045   GFRAA 59 (L) 04/18/2020 1026   GFRAA 31 (L) 09/04/2015 1045      Latest Ref Rng & Units 11/27/2023    1:27 PM 05/29/2023    1:53 PM 11/20/2022    2:47 PM  Hepatic Function  Total Protein 6.0 - 8.3 g/dL 8.0  7.7  7.7   Albumin 3.5 - 5.2 g/dL 4.3  4.4  4.1   AST 0 - 37 U/L 21  18  16    ALT 0 - 35 U/L 15  11  10    Alk Phosphatase 39 - 117 U/L 57  60  61   Total Bilirubin 0.2 - 1.2 mg/dL 0.9  0.9  0.6       Current Medications:   Current Outpatient Medications (Endocrine & Metabolic):    OZEMPIC , 1 MG/DOSE, 4 MG/3ML SOPN, INJECT 1 MG UNDER THE SKIN ONE DAY A WEEK AS DIRECTED   FARXIGA  10 MG TABS tablet, Take 1 tablet (10 mg total) by mouth daily before breakfast. (Patient not taking: Reported on 12/11/2023)  Current Outpatient Medications (Cardiovascular):    amLODipine  (NORVASC ) 10 MG tablet, TAKE 1 TABLET(10 MG) BY MOUTH DAILY   lisinopril  (ZESTRIL ) 20 MG tablet, TAKE 1 TABLET(20  MG) BY MOUTH DAILY     Current Outpatient Medications (Other):    cholecalciferol (VITAMIN D3) 25 MCG (1000 UNIT) tablet, Take 1 tablet (1,000 Units total) by mouth daily. (Patient not taking: Reported on 12/11/2023)  Medical History:  Past Medical History:  Diagnosis Date   Anxiety    Arthritis    Attention deficit disorder without mention of hyperactivity    Depression    Diabetes (HCC)    Edema of both lower extremities    Esophageal reflux    High blood sugar    High cholesterol    Hypertension    Iron deficiency anemia, unspecified    Myalgia and myositis, unspecified    Pneumonia    2008 or 2009   Pre-diabetes    Undiagnosed cardiac murmurs    Vitamin B12 deficiency    Vitamin D  deficiency    Allergies: No Known Allergies   Surgical History:  She  has a past surgical history that includes Dilatation & curettage/hysteroscopy with myosure (N/A, 07/14/2018); Operative ultrasound (N/A, 07/14/2018); Colonoscopy (11/23/2021); and Upper gastrointestinal endoscopy (11/23/2021). Family History:  Her family history includes Alcoholism in her father and mother; Arthritis in her mother; Breast cancer in her sister; Diabetes in her brother and mother; Heart attack in her father; Heart disease in her father; Hernia in her sister; Hyperlipidemia in her mother; Hypertension in her mother and sister; Kidney disease in her mother; Lung cancer in her father.  REVIEW OF SYSTEMS  : All other systems reviewed and negative except where noted in the History of Present Illness.  PHYSICAL EXAM: BP 122/72   Pulse 75   Ht 5' 5 (1.651 m)   Wt 179 lb (81.2 kg)   LMP 06/11/2011 (Within Years)   BMI 29.79 kg/m  Physical Exam   GENERAL APPEARANCE: Well nourished, in no apparent distress. HEENT: No cervical lymphadenopathy, unremarkable thyroid , sclerae anicteric, conjunctiva pink. RESPIRATORY: Respiratory effort normal, breath sounds clear to auscultation bilaterally without rales, rhonchi, or  wheezing. CARDIO: Regular rate and rhythm with no murmurs, rubs, or gallops, peripheral pulses intact. ABDOMEN: Soft, non-distended, active bowel sounds in all four quadrants, non-tender to palpation, no rebound, no mass  appreciated. RECTAL: Declines. MUSCULOSKELETAL: Full range of motion, normal gait, without edema. SKIN: Dry, intact without rashes or lesions. No jaundice. NEURO: Alert, oriented, no focal deficits. PSYCH: Cooperative, normal mood and affect.      Alan JONELLE Coombs, PA-C 9:43 AM

## 2023-12-11 ENCOUNTER — Other Ambulatory Visit

## 2023-12-11 ENCOUNTER — Encounter: Payer: Self-pay | Admitting: Physician Assistant

## 2023-12-11 ENCOUNTER — Ambulatory Visit: Payer: Self-pay | Admitting: Physician Assistant

## 2023-12-11 ENCOUNTER — Ambulatory Visit: Admitting: Physician Assistant

## 2023-12-11 VITALS — BP 122/72 | HR 75 | Ht 65.0 in | Wt 179.0 lb

## 2023-12-11 DIAGNOSIS — N1832 Chronic kidney disease, stage 3b: Secondary | ICD-10-CM | POA: Diagnosis not present

## 2023-12-11 DIAGNOSIS — E1122 Type 2 diabetes mellitus with diabetic chronic kidney disease: Secondary | ICD-10-CM | POA: Diagnosis not present

## 2023-12-11 DIAGNOSIS — D509 Iron deficiency anemia, unspecified: Secondary | ICD-10-CM

## 2023-12-11 DIAGNOSIS — Z860100 Personal history of colon polyps, unspecified: Secondary | ICD-10-CM

## 2023-12-11 DIAGNOSIS — Z7985 Long-term (current) use of injectable non-insulin antidiabetic drugs: Secondary | ICD-10-CM | POA: Diagnosis not present

## 2023-12-11 DIAGNOSIS — D649 Anemia, unspecified: Secondary | ICD-10-CM

## 2023-12-11 DIAGNOSIS — Z860101 Personal history of adenomatous and serrated colon polyps: Secondary | ICD-10-CM

## 2023-12-11 DIAGNOSIS — K219 Gastro-esophageal reflux disease without esophagitis: Secondary | ICD-10-CM | POA: Diagnosis not present

## 2023-12-11 DIAGNOSIS — K2970 Gastritis, unspecified, without bleeding: Secondary | ICD-10-CM

## 2023-12-11 LAB — IBC + FERRITIN
Ferritin: 333 ng/mL — ABNORMAL HIGH (ref 10.0–291.0)
Iron: 83 ug/dL (ref 42–145)
Saturation Ratios: 31.2 % (ref 20.0–50.0)
TIBC: 266 ug/dL (ref 250.0–450.0)
Transferrin: 190 mg/dL — ABNORMAL LOW (ref 212.0–360.0)

## 2023-12-11 LAB — VITAMIN B12: Vitamin B-12: 465 pg/mL (ref 211–911)

## 2023-12-11 NOTE — Progress Notes (Signed)
 I agree with the assessment and plan as outlined by Ms. Steffanie Dunn.

## 2023-12-11 NOTE — Patient Instructions (Addendum)
 _______________________________________________________  If your blood pressure at your visit was 140/90 or greater, please contact your primary care physician to follow up on this.  _______________________________________________________  If you are age 68 or older, your body mass index should be between 23-30. Your Body mass index is 29.79 kg/m. If this is out of the aforementioned range listed, please consider follow up with your Primary Care Provider.  If you are age 41 or younger, your body mass index should be between 19-25. Your Body mass index is 29.79 kg/m. If this is out of the aformentioned range listed, please consider follow up with your Primary Care Provider.   ________________________________________________________  The [facility] providers would like to encourage you to use MYCHART to communicate with providers for non-urgent requests or questions.  Due to long hold times on the telephone, sending your provider a message by [facility] may be a faster and more efficient way to get a response.  Please allow 48 business hours for a response.  Please remember that this is for non-urgent requests.  _______________________________________________________  Your provider has requested that you go to the basement level for lab work before leaving today. Press B on the elevator. The lab is located at the first door on the left as you exit the elevator.  Follow the instructions on the Hemoccult cards and mail them back to us  when you are finished or you may take them directly to the lab in the basement of the [place] building. We will call you with the results.   Please follow up in 6 months. Give us  a call at [phone] to schedule an appointment.  Metabolic dysfunction associated seatohepatitis  Now the leading cause of liver failure in the [place] .  It is normally from such risk factors as obesity, diabetes, insulin resistance, high cholesterol, or metabolic syndrome.  The only  definitive therapy is weight loss and exercise.   Suggest walking 20-30 mins daily.  Decreasing carbohydrates, increasing veggies.    Fatty Liver Fatty liver is the accumulation of fat in liver cells. It is also called hepatosteatosis or steatohepatitis. It is normal for your liver to contain some fat. If fat is more than 5 to 10% of your liver's weight, you have fatty liver.  There are often no symptoms (problems) for years while damage is still occurring. People often learn about their fatty liver when they have medical tests for other reasons. Fat can damage your liver for years or even decades without causing problems. When it becomes severe, it can cause fatigue, weight loss, weakness, and confusion. This makes you more likely to develop more serious liver problems. The liver is the largest organ in the body. It does a lot of work and often gives no warning signs when it is sick until late in a disease. The liver has many important jobs including: Breaking down foods. Storing vitamins, iron, and other minerals. Making proteins. Making bile for food digestion. Breaking down many products including medications, alcohol and some poisons.  PROGNOSIS  Fatty liver may cause no damage or it can lead to an inflammation of the liver. This is, called steatohepatitis.  Over time the liver may become scarred and hardened. This condition is called cirrhosis. Cirrhosis is serious and may lead to liver failure or cancer. NASH is one of the leading causes of cirrhosis. About 10-20% of Americans have fatty liver and a smaller 2-5% has NASH.  TREATMENT  Weight loss, fat restriction, and exercise in overweight patients produces inconsistent results  but is worth trying. Good control of diabetes may reduce fatty liver. Eat a balanced, healthy diet. Increase your physical activity. There are no medical or surgical treatments for a fatty liver or NASH, but improving your diet and increasing your exercise may  help prevent or reverse some of the damage.

## 2024-05-15 LAB — HM MAMMOGRAPHY

## 2024-05-19 ENCOUNTER — Ambulatory Visit: Payer: Self-pay | Admitting: Emergency Medicine

## 2024-06-09 ENCOUNTER — Other Ambulatory Visit: Payer: Self-pay | Admitting: Emergency Medicine

## 2024-06-09 DIAGNOSIS — E1159 Type 2 diabetes mellitus with other circulatory complications: Secondary | ICD-10-CM

## 2024-06-11 ENCOUNTER — Other Ambulatory Visit: Payer: Self-pay | Admitting: Emergency Medicine

## 2024-06-11 DIAGNOSIS — I10 Essential (primary) hypertension: Secondary | ICD-10-CM

## 2024-06-11 NOTE — Telephone Encounter (Signed)
 Copied from CRM [id number]. Topic: Clinical - Medication Refill >> Jun 11, 2024 12:53 PM China J wrote: Medication: amLODipine  (NORVASC ) 10 MG tablet  Has the patient contacted their pharmacy? Yes (Agent: If no, request that the patient contact the pharmacy for the refill. If patient does not wish to contact the pharmacy document the reason why and proceed with request.) (Agent: If yes, when and what did the pharmacy advise?)  This is the patient's preferred pharmacy:   [id number] [place], [place] - [address] AT [facility] OF [address] [place] [address] Phone: [phone] Fax: [phone]  Is this the correct pharmacy for this prescription? Yes If no, delete pharmacy and type the correct one.   Has the prescription been filled recently? No  Is the patient out of the medication? Yes  Has the patient been seen for an appointment in the last year OR does the patient have an upcoming appointment? Yes  Can we respond through MyChart? Yes  Agent: Please be advised that Rx refills may take up to 3 business days. We ask that you follow-up with your pharmacy.

## 2024-06-14 NOTE — Telephone Encounter (Signed)
 Copied from CRM [id number]. Topic: Clinical - Medication Question >> Jun 14, 2024 11:03 AM Revonda D wrote: Reason for CRM: Pt submitted a refill request for the amLODipine  (NORVASC ) 10 MG tablet on 06/11/24 and would like for the request to be approved today if possible because she is currently out. Pt would like a callback with an update.

## 2024-06-15 MED ORDER — AMLODIPINE BESYLATE 10 MG PO TABS: ORAL_TABLET | ORAL | 0 refills | Status: AC

## 2024-06-17 ENCOUNTER — Ambulatory Visit: Admitting: Emergency Medicine

## 2024-07-07 ENCOUNTER — Ambulatory Visit: Admitting: Emergency Medicine

## 2024-07-07 ENCOUNTER — Ambulatory Visit (INDEPENDENT_AMBULATORY_CARE_PROVIDER_SITE_OTHER): Admitting: Emergency Medicine

## 2024-07-07 ENCOUNTER — Encounter: Payer: Self-pay | Admitting: Emergency Medicine

## 2024-07-07 VITALS — BP 136/82 | HR 80 | Temp 98.1°F | Ht 65.0 in | Wt 171.0 lb

## 2024-07-07 DIAGNOSIS — E1159 Type 2 diabetes mellitus with other circulatory complications: Secondary | ICD-10-CM | POA: Diagnosis not present

## 2024-07-07 DIAGNOSIS — Z7984 Long term (current) use of oral hypoglycemic drugs: Secondary | ICD-10-CM

## 2024-07-07 DIAGNOSIS — Z7985 Long-term (current) use of injectable non-insulin antidiabetic drugs: Secondary | ICD-10-CM

## 2024-07-07 DIAGNOSIS — N1832 Chronic kidney disease, stage 3b: Secondary | ICD-10-CM

## 2024-07-07 DIAGNOSIS — I152 Hypertension secondary to endocrine disorders: Secondary | ICD-10-CM

## 2024-07-07 DIAGNOSIS — E1122 Type 2 diabetes mellitus with diabetic chronic kidney disease: Secondary | ICD-10-CM | POA: Diagnosis not present

## 2024-07-07 LAB — POCT GLYCOSYLATED HEMOGLOBIN (HGB A1C): HbA1c POC (<> result, manual entry): 5.9 %

## 2024-07-07 NOTE — Patient Instructions (Signed)
 Health Maintenance After Age 69 After age 27, you are at a higher risk for certain long-term diseases and infections as well as injuries from falls. Falls are a major cause of broken bones and head injuries in people who are older than age 73. Getting regular preventive care can help to keep you healthy and well. Preventive care includes getting regular testing and making lifestyle changes as recommended by your health care provider. Talk with your health care provider about: Which screenings and tests you should have. A screening is a test that checks for a disease when you have no symptoms. A diet and exercise plan that is right for you. What should I know about screenings and tests to prevent falls? Screening and testing are the best ways to find a health problem early. Early diagnosis and treatment give you the best chance of managing medical conditions that are common after age 90. Certain conditions and lifestyle choices may make you more likely to have a fall. Your health care provider may recommend: Regular vision checks. Poor vision and conditions such as cataracts can make you more likely to have a fall. If you wear glasses, make sure to get your prescription updated if your vision changes. Medicine review. Work with your health care provider to regularly review all of the medicines you are taking, including over-the-counter medicines. Ask your health care provider about any side effects that may make you more likely to have a fall. Tell your health care provider if any medicines that you take make you feel dizzy or sleepy. Strength and balance checks. Your health care provider may recommend certain tests to check your strength and balance while standing, walking, or changing positions. Foot health exam. Foot pain and numbness, as well as not wearing proper footwear, can make you more likely to have a fall. Screenings, including: Osteoporosis screening. Osteoporosis is a condition that causes  the bones to get weaker and break more easily. Blood pressure screening. Blood pressure changes and medicines to control blood pressure can make you feel dizzy. Depression screening. You may be more likely to have a fall if you have a fear of falling, feel depressed, or feel unable to do activities that you used to do. Alcohol  use screening. Using too much alcohol  can affect your balance and may make you more likely to have a fall. Follow these instructions at home: Lifestyle Do not drink alcohol  if: Your health care provider tells you not to drink. If you drink alcohol : Limit how much you have to: 0-1 drink a day for women. 0-2 drinks a day for men. Know how much alcohol  is in your drink. In the [place] one drink equals one 12 oz bottle of beer (355 mL), one 5 oz glass of wine (148 mL), or one 1 oz glass of hard liquor (44 mL). Do not use any products that contain nicotine or tobacco. These products include cigarettes, chewing tobacco, and vaping devices, such as e-cigarettes. If you need help quitting, ask your health care provider. Activity  Follow a regular exercise program to stay fit. This will help you maintain your balance. Ask your health care provider what types of exercise are appropriate for you. If you need a cane or walker, use it as recommended by your health care provider. Wear supportive shoes that have nonskid soles. Safety  Remove any tripping hazards, such as rugs, cords, and clutter. Install safety equipment such as grab bars in bathrooms and safety rails on stairs. Keep rooms and walkways  well-lit. General instructions Talk with your health care provider about your risks for falling. Tell your health care provider if: You fall. Be sure to tell your health care provider about all falls, even ones that seem minor. You feel dizzy, tiredness (fatigue), or off-balance. Take over-the-counter and prescription medicines only as told by your health care provider. These include  supplements. Eat a healthy diet and maintain a healthy weight. A healthy diet includes low-fat dairy products, low-fat (lean) meats, and fiber from whole grains, beans, and lots of fruits and vegetables. Stay current with your vaccines. Schedule regular health, dental, and eye exams. Summary Having a healthy lifestyle and getting preventive care can help to protect your health and wellness after age 15. Screening and testing are the best way to find a health problem early and help you avoid having a fall. Early diagnosis and treatment give you the best chance for managing medical conditions that are more common for people who are older than age 42. Falls are a major cause of broken bones and head injuries in people who are older than age 64. Take precautions to prevent a fall at home. Work with your health care provider to learn what changes you can make to improve your health and wellness and to prevent falls. This information is not intended to replace advice given to you by your health care provider. Make sure you discuss any questions you have with your health care provider. Document Revised: 10/16/2020 Document Reviewed: 10/16/2020 Elsevier Patient Education  2024 [employer].

## 2024-07-07 NOTE — Assessment & Plan Note (Signed)
 Chronic stable condition Advised to stay well-hydrated and avoid NSAIDs is much as possible Recommend to start Farxiga 10 mg daily

## 2024-07-07 NOTE — Assessment & Plan Note (Signed)
 Well-controlled hypertension Continue amlodipine  10 mg and lisinopril  20 mg daily Well-controlled diabetes with hemoglobin A1c of 5.9 Continue semaglutide  1 mg weekly and start daily Farxiga  10 mg mostly for kidney protection. Cardiovascular risks associated with hypertension and diabetes discussed Diet and nutrition discussed Follow-up in 6 months

## 2024-07-07 NOTE — Progress Notes (Signed)
 Emily Lopez 69 y.o.   Chief Complaint  Patient presents with   Follow-up    HISTORY OF PRESENT ILLNESS: This is a 69 y.o. female here for follow-up of multiple chronic medical conditions. Overall doing well.  Has no complaints or medical concerns today. BP Readings from Last 3 Encounters:  12/11/23 122/72  11/27/23 (!) 140/80  05/29/23 138/82   Wt Readings from Last 3 Encounters:  12/11/23 179 lb (81.2 kg)  11/27/23 170 lb (77.1 kg)  08/20/23 156 lb (70.8 kg)   Lab Results  Component Value Date   HGBA1C 6.1 11/27/2023     HPI   Prior to Admission medications  Medication Sig Start Date End Date Taking? Authorizing Provider  amLODipine  (NORVASC ) 10 MG tablet TAKE 1 TABLET(10 MG) BY MOUTH DAILY 06/15/24  Yes Caleen Taaffe, Emil Schanz, MD  lisinopril  (ZESTRIL ) 20 MG tablet TAKE 1 TABLET(20 MG) BY MOUTH DAILY 11/27/23  Yes Nylen Creque, Emil Schanz, MD  OZEMPIC , 1 MG/DOSE, 4 MG/3ML SOPN INJECT 1 MG UNDER THE SKIN ONE DAY A WEEK AS DIRECTED 06/11/24  Yes Crystian Frith, Emil Schanz, MD  cholecalciferol (VITAMIN D3) 25 MCG (1000 UNIT) tablet Take 1 tablet (1,000 Units total) by mouth daily. Patient not taking: Reported on 07/07/2024 01/09/22   Federico Rosario BROCKS, MD  FARXIGA  10 MG TABS tablet Take 1 tablet (10 mg total) by mouth daily before breakfast. 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 08/07/2006   Hypertension associated with diabetes (HCC) 08/07/2006    Past Medical History:  Diagnosis Date   Anxiety    Arthritis    Attention deficit disorder without mention of hyperactivity    Depression    Diabetes (HCC)    Edema of both lower extremities    Esophageal reflux    High blood sugar    High cholesterol    Hypertension    Iron deficiency anemia, unspecified    Myalgia and myositis, unspecified    Pneumonia    2008 or 2009   Pre-diabetes    Undiagnosed cardiac murmurs    Vitamin B12 deficiency    Vitamin D  deficiency     Past Surgical History:  Procedure Laterality Date   COLONOSCOPY  11/23/2021   DILATATION & CURETTAGE/HYSTEROSCOPY WITH MYOSURE N/A 07/14/2018   Procedure: DILATATION & CURETTAGE/HYSTEROSCOPY with ultrasound guidance;  Surgeon: Cathlyn BROCKS Nikki Bobie FORBES, MD;  Location: [facility] [place];  Service: Gynecology;  Laterality: N/A;  ultrasound guidance needed. Follow previous case.   OPERATIVE ULTRASOUND N/A 07/14/2018   Procedure: OPERATIVE ULTRASOUND ultrasound guided hysteroscopy;  Surgeon: Cathlyn BROCKS Nikki Bobie FORBES, MD;  Location: [facility];  Service: Gynecology;  Laterality: N/A;  ultrasound guided hysteroscopy/D&C due to cervical stenosis   UPPER GASTROINTESTINAL ENDOSCOPY  11/23/2021    Social History   Socioeconomic History  Marital status: Legally Separated    Spouse name: Not on file   Number of children: 3   Years of education: Not on file   Highest education level: Not on file  Occupational History   Occupation: Caregiver  Tobacco Use   Smoking status: Never   Smokeless tobacco: Former    Types: Snuff    Quit date: 07/24/2004   Tobacco comments:    Age 39 quit, started at age 15  Vaping Use   Vaping status: Never Used  Substance and Sexual Activity   Alcohol use: Not Currently    Comment: 2 drinks   Drug use:  Never   Sexual activity: Not Currently    Birth control/protection: None, Post-menopausal  Other Topics Concern   Not on file  Social History Narrative   Lives at home alone/with 2 dogs 2025   Social Drivers of Health   Tobacco Use: Medium Risk (07/07/2024)   Patient History    Smoking Tobacco Use: Never    Smokeless Tobacco Use: Former    Passive Exposure: Not on Actuary Strain: Low Risk (08/20/2023)   Overall Financial Resource Strain (CARDIA)    Difficulty of Paying Living Expenses: Not hard at all  Food Insecurity: No Food Insecurity (08/20/2023)   Hunger Vital Sign    Worried About Running Out of Food in the Last Year: Never true    Ran Out of Food in the Last Year: Never true  Transportation Needs: No Transportation Needs (08/20/2023)   PRAPARE - Administrator, Civil Service (Medical): No    Lack of Transportation (Non-Medical): No  Physical Activity: Inactive (08/20/2023)   Exercise Vital Sign    Days of Exercise per Week: 0 days    Minutes of Exercise per Session: 0 min  Stress: No Stress Concern Present (08/20/2023)   [employer] of Occupational Health - Occupational Stress Questionnaire    Feeling of Stress : Not at all  Social Connections: Socially Isolated (08/20/2023)   Social Connection and Isolation Panel    Frequency of Communication with Friends and Family: More than three times a week    Frequency of Social Gatherings with Friends and Family: Three times a week    Attends Religious Services: Never    Active Member of Clubs or Organizations: No    Attends Banker Meetings: Never    Marital Status: Separated  Intimate Partner Violence: Not At Risk (05/13/2022)   Humiliation, Afraid, Rape, and Kick questionnaire    Fear of Current or Ex-Partner: No    Emotionally Abused: No    Physically Abused: No    Sexually Abused: No  Depression (PHQ2-9): Low Risk (08/20/2023)   Depression (PHQ2-9)    PHQ-2 Score: 0  Alcohol  Screen: Low Risk (08/20/2023)   Alcohol Screen    Last Alcohol Screening Score (AUDIT): 0  Housing: Unknown (08/20/2023)   Housing Stability Vital Sign    Unable to Pay for Housing in the Last Year: No    Number of Times Moved in the Last Year: Not on file    Homeless in the Last Year: No  Utilities: Not At Risk (08/20/2023)   AHC Utilities    Threatened with loss of utilities: No  Health Literacy: Adequate Health Literacy (08/20/2023)   B1300 Health Literacy    Frequency of need for help with medical instructions: Never    Family History  Problem Relation Age of Onset   Hypertension Mother    Diabetes  Mother    Arthritis Mother    Hyperlipidemia Mother    Kidney disease Mother    Alcoholism Mother    Lung cancer Father    Heart attack Father    Heart disease Father    Alcoholism Father    Hypertension Sister    Breast cancer Sister    Hernia Sister    Diabetes Brother    Colon cancer Neg Hx    Esophageal cancer Neg Hx    Colon polyps Neg Hx    Rectal cancer Neg Hx    Stomach cancer Neg Hx      Review of Systems  Constitutional: Negative.  Negative for chills and fever.  HENT: Negative.  Negative for congestion and sore throat.   Respiratory: Negative.  Negative for cough and shortness of breath.   Cardiovascular: Negative.  Negative for chest pain and palpitations.  Gastrointestinal:  Negative for abdominal pain, diarrhea, nausea and vomiting.  Genitourinary: Negative.  Negative for dysuria and hematuria.  Skin: Negative.  Negative for rash.  Neurological: Negative.  Negative for dizziness and headaches.  All other systems reviewed and are negative.   Today's Vitals   07/07/24 1110  BP: 136/82  Pulse: 80  Temp: 98.1 F (36.7 C)  TempSrc: Oral  SpO2: 98%  Weight: 171 lb (77.6 kg)  Height: 5' 5 (1.651 m)   Body mass index is 28.46 kg/m.   Physical Exam Vitals reviewed.  Constitutional:      Appearance: Normal appearance.  HENT:     Head:  Normocephalic.     Mouth/Throat:     Mouth: Mucous membranes are moist.     Pharynx: Oropharynx is clear.  Eyes:     Extraocular Movements: Extraocular movements intact.     Conjunctiva/sclera: Conjunctivae normal.     Pupils: Pupils are equal, round, and reactive to light.  Cardiovascular:     Rate and Rhythm: Normal rate and regular rhythm.     Pulses: Normal pulses.     Heart sounds: Normal heart sounds.  Pulmonary:     Effort: Pulmonary effort is normal.     Breath sounds: Normal breath sounds.  Skin:    General: Skin is warm and dry.  Neurological:     General: No focal deficit present.     Mental Status: She is alert and oriented to person, place, and time.  Psychiatric:        Mood and Affect: Mood normal.        Behavior: Behavior normal.    Results for orders placed or performed in visit on 07/07/24 (from the past 24 hours)  POCT HgB A1C     Status: Normal   Collection Time: 07/07/24 11:23 AM  Result Value Ref Range   Hemoglobin A1C     HbA1c POC (<> result, manual entry) 5.9 4.0 - 5.6 %   HbA1c, POC (prediabetic range)     HbA1c, POC (controlled diabetic range)       ASSESSMENT & PLAN: A total of 42 minutes was spent with the patient and counseling/coordination of care regarding preparing for this visit, review of most recent office visit notes, review of multiple chronic medical conditions and their management, cardiovascular risks associated with hypertension and diabetes, review of all medications, review of most recent bloodwork results including interpretation of today's hemoglobin A1c, review of health maintenance items, education on nutrition, prognosis, documentation, and need for follow up.   Problem List Items Addressed This Visit  Cardiovascular and Mediastinum   Hypertension associated with diabetes (HCC) - Primary   Well-controlled hypertension Continue amlodipine  10 mg and lisinopril  20 mg daily Well-controlled diabetes with hemoglobin A1c  of 5.9 Continue semaglutide  1 mg weekly and start daily Farxiga  10 mg mostly for kidney protection. Cardiovascular risks associated with hypertension and diabetes discussed Diet and nutrition discussed Follow-up in 6 months      Relevant Orders   POCT HgB A1C (Completed)     Genitourinary   Stage 3b chronic kidney disease (HCC)   Chronic stable condition Advised to stay well-hydrated and avoid NSAIDs is much as possible Recommend to start Farxiga  10 mg daily      Patient Instructions  Health Maintenance After Age 9 After age 35, you are at a higher risk for certain long-term diseases and infections as well as injuries from falls. Falls are a major cause of broken bones and head injuries in people who are older than age 29. Getting regular preventive care can help to keep you healthy and well. Preventive care includes getting regular testing and making lifestyle changes as recommended by your health care provider. Talk with your health care provider about: Which screenings and tests you should have. A screening is a test that checks for a disease when you have no symptoms. A diet and exercise plan that is right for you. What should I know about screenings and tests to prevent falls? Screening and testing are the best ways to find a health problem early. Early diagnosis and treatment give you the best chance of managing medical conditions that are common after age 10. Certain conditions and lifestyle choices may make you more likely to have a fall. Your health care provider may recommend: Regular vision checks. Poor vision and conditions such as cataracts can make you more likely to have a fall. If you wear glasses, make sure to get your prescription updated if your vision changes. Medicine review. Work with your health care provider to regularly review all of the medicines you are taking, including over-the-counter medicines. Ask your health care provider about any side effects that may  make you more likely to have a fall. Tell your health care provider if any medicines that you take make you feel dizzy or sleepy. Strength and balance checks. Your health care provider may recommend certain tests to check your strength and balance while standing, walking, or changing positions. Foot health exam. Foot pain and numbness, as well as not wearing proper footwear, can make you more likely to have a fall. Screenings, including: Osteoporosis screening. Osteoporosis is a condition that causes the bones to get weaker and break more easily. Blood pressure screening. Blood pressure changes and medicines to control blood pressure can make you feel dizzy. Depression screening. You may be more likely to have a fall if you have a fear of falling, feel depressed, or feel unable to do activities that you used to do. Alcohol use screening. Using too much alcohol can affect your balance and may make you more likely to have a fall. Follow these instructions at home: Lifestyle Do not drink alcohol if: Your health care provider tells you not to drink. If you drink alcohol: Limit how much you have to: 0-1 drink a day for women. 0-2 drinks a day for men. Know how much alcohol is in your drink. In the [place] one drink equals one 12 oz bottle of beer (355 mL), one 5 oz glass of wine (148 mL), or one 1 oz  glass of hard liquor (44 mL). Do not use any products that contain nicotine or tobacco. These products include cigarettes, chewing tobacco, and vaping devices, such as e-cigarettes. If you need help quitting, ask your health care provider. Activity  Follow a regular exercise program to stay fit. This will help you maintain your balance. Ask your health care provider what types of exercise are appropriate for you. If you need a cane or walker, use it as recommended by your health care provider. Wear supportive shoes that have nonskid soles. Safety  Remove any tripping hazards, such as rugs, cords, and  clutter. Install safety equipment such as grab bars in bathrooms and safety rails on stairs. Keep rooms and walkways well-lit. General instructions Talk with your health care provider about your risks for falling. Tell your health care provider if: You fall. Be sure to tell your health care provider about all falls, even ones that seem minor. You feel dizzy, tiredness (fatigue), or off-balance. Take over-the-counter and prescription medicines only as told by your health care provider. These include supplements. Eat a healthy diet and maintain a healthy weight. A healthy diet includes low-fat dairy products, low-fat (lean) meats, and fiber from whole grains, beans, and lots of fruits and vegetables. Stay current with your vaccines. Schedule regular health, dental, and eye exams. Summary Having a healthy lifestyle and getting preventive care can help to protect your health and wellness after age 74. Screening and testing are the best way to find a health problem early and help you avoid having a fall. Early diagnosis and treatment give you the best chance for managing medical conditions that are more common for people who are older than age 91. Falls are a major cause of broken bones and head injuries in people who are older than age 71. Take precautions to prevent a fall at home. Work with your health care provider to learn what changes you can make to improve your health and wellness and to prevent falls. This information is not intended to replace advice given to you by your health care provider. Make sure you discuss any questions you have with your health care provider. Document Revised: 10/16/2020 Document Reviewed: 10/16/2020 Elsevier Patient Education  2024 [employer]     Emil Schaumann, MD [facility]    [1] No Known Allergies
